# Patient Record
Sex: Male | Born: 1990 | Hispanic: No | Marital: Single | Smoking: Never smoker
Health system: Southern US, Community
[De-identification: ages and names within clinical notes are randomized; demographics above are authoritative.]

## PROBLEM LIST (undated history)

## (undated) DIAGNOSIS — J302 Other seasonal allergic rhinitis: Secondary | ICD-10-CM

## (undated) HISTORY — PX: APPENDECTOMY: SHX54

---
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
## 2011-02-01 ENCOUNTER — Emergency Department (HOSPITAL_COMMUNITY)
Admission: EM | Admit: 2011-02-01 | Discharge: 2011-02-02 | Disposition: A | Discharge status: Home / Self Care | Payer: Self-pay | Attending: Emergency Medicine | Admitting: Emergency Medicine

## 2011-02-01 DIAGNOSIS — R1013 Epigastric pain: Secondary | ICD-10-CM | POA: Insufficient documentation

## 2011-02-01 DIAGNOSIS — R079 Chest pain, unspecified: Secondary | ICD-10-CM | POA: Insufficient documentation

## 2011-02-01 DIAGNOSIS — F411 Generalized anxiety disorder: Secondary | ICD-10-CM | POA: Insufficient documentation

## 2011-02-03 LAB — GC/CHLAMYDIA PROBE AMP, GENITAL
Chlamydia, DNA Probe: NEGATIVE
GC Probe Amp, Genital: NEGATIVE

## 2011-03-02 LAB — RAPID URINE DRUG SCREEN, HOSP PERFORMED
Cocaine: NOT DETECTED
Opiates: NOT DETECTED

## 2011-03-02 LAB — BASIC METABOLIC PANEL
GFR calc Af Amer: >60 mL/min (ref >60)
GFR calc non Af Amer: >60 mL/min (ref >60)
Glucose, Bld: 124 mg/dL — ABNORMAL HIGH (ref 70–99)
Potassium: 3.8 mEq/L (ref 3.5–5.1)
Sodium: 138 mEq/L (ref 135–145)

## 2011-03-02 LAB — DIFFERENTIAL
Eosinophils Relative: 3 % (ref 0–5)
Lymphocytes Relative: 38 % (ref 12–46)
Lymphs Abs: 3.6 10*3/uL (ref 0.7–4.0)
Monocytes Absolute: 0.6 10*3/uL (ref 0.1–1.0)
Monocytes Relative: 6 % (ref 3–12)
Neutro Abs: 4.9 10*3/uL (ref 1.7–7.7)

## 2011-03-02 LAB — CBC
HCT: 47.2 % (ref 39.0–52.0)
Hemoglobin: 16.1 g/dL (ref 13.0–17.0)
RBC: 5.59 MIL/uL (ref 4.22–5.81)
WBC: 9.5 10*3/uL (ref 4.0–10.5)

## 2011-03-02 LAB — POCT CARDIAC MARKERS
CKMB, poc: <1 ng/mL — ABNORMAL LOW (ref 1.0–8.0)
Myoglobin, poc: 43.9 ng/mL (ref 12–200)
Troponin i, poc: <0.05 ng/mL (ref 0.00–0.09)

## 2011-03-06 LAB — RAPID URINE DRUG SCREEN, HOSP PERFORMED
Amphetamines: NOT DETECTED
Barbiturates: NOT DETECTED
Benzodiazepines: NOT DETECTED
Cocaine: NOT DETECTED
Opiates: NOT DETECTED
Tetrahydrocannabinol: NOT DETECTED

## 2011-03-06 LAB — POCT CARDIAC MARKERS: Troponin i, poc: <0.05 ng/mL (ref 0.00–0.09)

## 2011-03-06 LAB — D-DIMER, QUANTITATIVE: D-Dimer, Quant: <0.22 ug/mL-FEU (ref 0.00–0.48)

## 2011-08-05 ENCOUNTER — Emergency Department (HOSPITAL_COMMUNITY)
Admission: EM | Admit: 2011-08-05 | Discharge: 2011-08-06 | Disposition: A | Discharge status: Home / Self Care | Payer: Self-pay | Attending: Emergency Medicine | Admitting: Emergency Medicine

## 2011-08-05 DIAGNOSIS — R079 Chest pain, unspecified: Secondary | ICD-10-CM | POA: Insufficient documentation

## 2011-08-05 DIAGNOSIS — M940 Chondrocostal junction syndrome [Tietze]: Secondary | ICD-10-CM | POA: Insufficient documentation

## 2011-08-06 ENCOUNTER — Emergency Department (HOSPITAL_COMMUNITY): Payer: Self-pay

## 2014-03-14 ENCOUNTER — Encounter (HOSPITAL_COMMUNITY): Payer: Self-pay | Admitting: Pharmacy Technician

## 2014-03-15 NOTE — H&P (Signed)
Rodney Thomas is an 23 y.o. male.   Chief Complaint: broken left arm HPI: In [place] doing a job training function with [employer]  He tried climbing over a fence to go after a basketball and as he went over the top of the fence he states he did not come down to the ground right and fell, injuring his left forearm.  [place] was [place] in [place] in [place] and was diagnosed with a radial shaft fracture.  Ulna was intact.  RADIOGRAPHS:  X-rays were reviewed, two views forearm which demonstrates the proximal 3rd radial shaft fracture with complete displacement without significant ambulation. No past medical history on file.  No past surgical history on file.  No family history on file. Social History:  has no tobacco, alcohol, and drug history on file.  Allergies: No Known Allergies  No prescriptions prior to admission    No results found for this or any previous visit (from the past 48 hour(s)). No results found.  Review of Systems  Musculoskeletal:       Left arm pain  All other systems reviewed and are negative.   There were no vitals taken for this visit. Physical Exam  Constitutional: He is oriented to person, place, and time. He appears well-developed and well-nourished.  HENT:  Head: Normocephalic and atraumatic.  Eyes: Pupils are equal, round, and reactive to light.  Neck: Normal range of motion.  Cardiovascular: Normal rate.   Respiratory: Effort normal.  GI: Soft.  Musculoskeletal:    Shoulder range of motion is full.  He is in a sugar tong splint, minimal swelling of the hand, he has full extension of his fingers.  Radial post interosseous nerve is intact.  Neurological: He is alert and oriented to person, place, and time.     Assessment/Plan Left radial shaft fracture  PLAN:  ORIF of left displaced radial shaft fracture.  Wende NeighborsSheila M Oland [place] 03/15/2014, 4:32 PM

## 2014-03-16 ENCOUNTER — Other Ambulatory Visit (HOSPITAL_COMMUNITY): Payer: Self-pay | Admitting: Orthopaedic Surgery

## 2014-03-16 ENCOUNTER — Encounter (HOSPITAL_COMMUNITY): Payer: Self-pay | Admitting: *Deleted

## 2014-03-17 ENCOUNTER — Encounter (HOSPITAL_COMMUNITY): Payer: Self-pay | Admitting: *Deleted

## 2014-03-17 ENCOUNTER — Encounter (HOSPITAL_COMMUNITY): Payer: Worker's Compensation | Admitting: Anesthesiology

## 2014-03-17 ENCOUNTER — Observation Stay (HOSPITAL_COMMUNITY)
Admission: RE | Admit: 2014-03-17 | Discharge: 2014-03-18 | Disposition: A | Discharge status: Home / Self Care | Payer: Worker's Compensation | Source: Ambulatory Visit | Attending: Orthopaedic Surgery | Admitting: Orthopaedic Surgery

## 2014-03-17 ENCOUNTER — Encounter (HOSPITAL_COMMUNITY)
Admission: RE | Disposition: A | Discharge status: Home / Self Care | Payer: Self-pay | Source: Ambulatory Visit | Attending: Orthopaedic Surgery

## 2014-03-17 ENCOUNTER — Ambulatory Visit (HOSPITAL_COMMUNITY): Payer: Worker's Compensation | Admitting: Anesthesiology

## 2014-03-17 DIAGNOSIS — S52302A Unspecified fracture of shaft of left radius, initial encounter for closed fracture: Secondary | ICD-10-CM | POA: Diagnosis present

## 2014-03-17 DIAGNOSIS — S52309A Unspecified fracture of shaft of unspecified radius, initial encounter for closed fracture: Principal | ICD-10-CM | POA: Insufficient documentation

## 2014-03-17 DIAGNOSIS — W1789XA Other fall from one level to another, initial encounter: Secondary | ICD-10-CM | POA: Insufficient documentation

## 2014-03-17 HISTORY — DX: Other seasonal allergic rhinitis: J30.2

## 2014-03-17 HISTORY — PX: ORIF RADIAL FRACTURE: SHX5113

## 2014-03-17 LAB — CBC
HCT: 45.3 % (ref 39.0–52.0)
Hemoglobin: 15.5 g/dL (ref 13.0–17.0)
MCH: 28.3 pg (ref 26.0–34.0)
MCHC: 34.2 g/dL (ref 30.0–36.0)
MCV: 82.8 fL (ref 78.0–100.0)
PLATELETS: 230 10*3/uL (ref 150–400)
RBC: 5.47 MIL/uL (ref 4.22–5.81)
RDW: 14.7 % (ref 11.5–15.5)
WBC: 10.7 10*3/uL — ABNORMAL HIGH (ref 4.0–10.5)

## 2014-03-17 LAB — COMPREHENSIVE METABOLIC PANEL
ALK PHOS: 118 U/L — AB (ref 39–117)
ALT: 11 U/L (ref 0–53)
AST: 18 U/L (ref 0–37)
Albumin: 4.4 g/dL (ref 3.5–5.2)
BUN: 20 mg/dL (ref 6–23)
CHLORIDE: 101 meq/L (ref 96–112)
CO2: 27 meq/L (ref 19–32)
Calcium: 9.8 mg/dL (ref 8.4–10.5)
Creatinine, Ser: 1.49 mg/dL — ABNORMAL HIGH (ref 0.50–1.35)
GFR calc Af Amer: 76 mL/min — ABNORMAL LOW (ref >90)
GFR, EST NON AFRICAN AMERICAN: 65 mL/min — AB (ref >90)
GLUCOSE: 90 mg/dL (ref 70–99)
POTASSIUM: 3.9 meq/L (ref 3.7–5.3)
SODIUM: 143 meq/L (ref 137–147)
TOTAL PROTEIN: 7.9 g/dL (ref 6.0–8.3)
Total Bilirubin: 0.2 mg/dL — ABNORMAL LOW (ref 0.3–1.2)

## 2014-03-17 SURGERY — OPEN REDUCTION INTERNAL FIXATION (ORIF) RADIAL FRACTURE
Anesthesia: General | Site: Wrist | Laterality: Left

## 2014-03-17 MED ORDER — LIDOCAINE HCL (CARDIAC) 20 MG/ML IV SOLN
INTRAVENOUS | Status: AC
  Filled 2014-03-17: qty 5

## 2014-03-17 MED ORDER — MORPHINE SULFATE 2 MG/ML IJ SOLN: 1.0000 mg | INTRAMUSCULAR | Status: DC | PRN

## 2014-03-17 MED ORDER — CEFAZOLIN SODIUM-DEXTROSE 2-3 GM-% IV SOLR
INTRAVENOUS | Status: AC
  Filled 2014-03-17: qty 50

## 2014-03-17 MED ORDER — ONDANSETRON HCL 4 MG PO TABS: 4.0000 mg | ORAL_TABLET | Freq: Four times a day (QID) | ORAL | Status: DC | PRN

## 2014-03-17 MED ORDER — ONDANSETRON HCL 4 MG/2ML IJ SOLN
INTRAMUSCULAR | Status: AC
  Filled 2014-03-17: qty 2

## 2014-03-17 MED ORDER — BUPIVACAINE HCL (PF) 0.25 % IJ SOLN
INTRAMUSCULAR | Status: AC
  Filled 2014-03-17: qty 30

## 2014-03-17 MED ORDER — ARTIFICIAL TEARS OP OINT
TOPICAL_OINTMENT | OPHTHALMIC | Status: AC
  Filled 2014-03-17: qty 3.5

## 2014-03-17 MED ORDER — DOCUSATE SODIUM 100 MG PO CAPS
100.0000 mg | ORAL_CAPSULE | Freq: Two times a day (BID) | ORAL | Status: DC
  Administered 2014-03-17 – 2014-03-18 (×2): 100 mg via ORAL
  Filled 2014-03-17 (×2): qty 1

## 2014-03-17 MED ORDER — HYDROMORPHONE HCL PF 1 MG/ML IJ SOLN
INTRAMUSCULAR | Status: AC
  Filled 2014-03-17: qty 1

## 2014-03-17 MED ORDER — METHOCARBAMOL 100 MG/ML IJ SOLN
500.0000 mg | Freq: Four times a day (QID) | INTRAVENOUS | Status: DC | PRN
  Filled 2014-03-17: qty 5

## 2014-03-17 MED ORDER — ONDANSETRON HCL 4 MG/2ML IJ SOLN: 4.0000 mg | Freq: Four times a day (QID) | INTRAMUSCULAR | Status: DC | PRN

## 2014-03-17 MED ORDER — OXYCODONE-ACETAMINOPHEN 5-325 MG PO TABS: 1.0000 | ORAL_TABLET | ORAL | Status: DC | PRN

## 2014-03-17 MED ORDER — PROPOFOL 10 MG/ML IV BOLUS
INTRAVENOUS | Status: DC | PRN
  Administered 2014-03-17: 10 mg via INTRAVENOUS
  Administered 2014-03-17: 200 mg via INTRAVENOUS

## 2014-03-17 MED ORDER — FENTANYL CITRATE 0.05 MG/ML IJ SOLN
INTRAMUSCULAR | Status: DC | PRN
  Administered 2014-03-17 (×3): 50 ug via INTRAVENOUS

## 2014-03-17 MED ORDER — SENNOSIDES-DOCUSATE SODIUM 8.6-50 MG PO TABS
1.0000 | ORAL_TABLET | Freq: Every evening | ORAL | Status: DC | PRN
Start: 2014-03-17 — End: 2014-03-18

## 2014-03-17 MED ORDER — PROMETHAZINE HCL 25 MG/ML IJ SOLN: 6.2500 mg | INTRAMUSCULAR | Status: DC | PRN

## 2014-03-17 MED ORDER — FLEET ENEMA 7-19 GM/118ML RE ENEM: 1.0000 | ENEMA | Freq: Once | RECTAL | Status: AC | PRN

## 2014-03-17 MED ORDER — BISACODYL 10 MG RE SUPP: 10.0000 mg | Freq: Every day | RECTAL | Status: DC | PRN

## 2014-03-17 MED ORDER — HYDROMORPHONE HCL PF 1 MG/ML IJ SOLN
0.2500 mg | INTRAMUSCULAR | Status: DC | PRN
  Administered 2014-03-17 (×2): 0.25 mg via INTRAVENOUS

## 2014-03-17 MED ORDER — KETOROLAC TROMETHAMINE 30 MG/ML IJ SOLN: 15.0000 mg | Freq: Once | INTRAMUSCULAR | Status: DC | PRN

## 2014-03-17 MED ORDER — ASPIRIN 325 MG PO TABS
325.0000 mg | ORAL_TABLET | Freq: Every day | ORAL | Status: DC
  Administered 2014-03-17 – 2014-03-18 (×2): 325 mg via ORAL
  Filled 2014-03-17 (×2): qty 1

## 2014-03-17 MED ORDER — METOCLOPRAMIDE HCL 5 MG/ML IJ SOLN: 5.0000 mg | Freq: Three times a day (TID) | INTRAMUSCULAR | Status: DC | PRN

## 2014-03-17 MED ORDER — ONDANSETRON HCL 4 MG/2ML IJ SOLN
INTRAMUSCULAR | Status: DC | PRN
  Administered 2014-03-17: 4 mg via INTRAVENOUS

## 2014-03-17 MED ORDER — MEPERIDINE HCL 25 MG/ML IJ SOLN: 6.2500 mg | INTRAMUSCULAR | Status: DC | PRN

## 2014-03-17 MED ORDER — PROPOFOL 10 MG/ML IV BOLUS
INTRAVENOUS | Status: AC
  Filled 2014-03-17: qty 20

## 2014-03-17 MED ORDER — MIDAZOLAM HCL 2 MG/2ML IJ SOLN: 0.5000 mg | Freq: Once | INTRAMUSCULAR | Status: DC | PRN

## 2014-03-17 MED ORDER — OXYCODONE-ACETAMINOPHEN 5-325 MG PO TABS
1.0000 | ORAL_TABLET | ORAL | Status: DC | PRN
  Administered 2014-03-18: 2 via ORAL
  Filled 2014-03-17: qty 2

## 2014-03-17 MED ORDER — MIDAZOLAM HCL 2 MG/2ML IJ SOLN
INTRAMUSCULAR | Status: AC
  Filled 2014-03-17: qty 2

## 2014-03-17 MED ORDER — MIDAZOLAM HCL 5 MG/5ML IJ SOLN
INTRAMUSCULAR | Status: DC | PRN
  Administered 2014-03-17 (×2): 1 mg via INTRAVENOUS

## 2014-03-17 MED ORDER — KETOROLAC TROMETHAMINE 30 MG/ML IJ SOLN
30.0000 mg | Freq: Four times a day (QID) | INTRAMUSCULAR | Status: AC
  Administered 2014-03-17 – 2014-03-18 (×4): 30 mg via INTRAVENOUS
  Filled 2014-03-17 (×4): qty 1

## 2014-03-17 MED ORDER — DEXTROSE 5 % IV SOLN
INTRAVENOUS | Status: DC | PRN
  Administered 2014-03-17: 11:00:00 via INTRAVENOUS

## 2014-03-17 MED ORDER — KCL IN DEXTROSE-NACL 20-5-0.45 MEQ/L-%-% IV SOLN
INTRAVENOUS | Status: DC
  Administered 2014-03-17 – 2014-03-18 (×2): via INTRAVENOUS
  Filled 2014-03-17 (×3): qty 1000

## 2014-03-17 MED ORDER — METOCLOPRAMIDE HCL 5 MG PO TABS
5.0000 mg | ORAL_TABLET | Freq: Three times a day (TID) | ORAL | Status: DC | PRN
  Filled 2014-03-17: qty 2

## 2014-03-17 MED ORDER — LACTATED RINGERS IV SOLN
INTRAVENOUS | Status: DC | PRN
  Administered 2014-03-17 (×2): via INTRAVENOUS

## 2014-03-17 MED ORDER — FENTANYL CITRATE 0.05 MG/ML IJ SOLN
INTRAMUSCULAR | Status: AC
  Filled 2014-03-17: qty 5

## 2014-03-17 MED ORDER — BUPIVACAINE HCL 0.25 % IJ SOLN
INTRAMUSCULAR | Status: DC | PRN
  Administered 2014-03-17: 10 mL

## 2014-03-17 MED ORDER — HYDROCODONE-ACETAMINOPHEN 5-325 MG PO TABS: 1.0000 | ORAL_TABLET | ORAL | Status: DC | PRN

## 2014-03-17 MED ORDER — LIDOCAINE HCL (CARDIAC) 20 MG/ML IV SOLN
INTRAVENOUS | Status: DC | PRN
Start: 2014-03-17 — End: 2014-03-17
  Administered 2014-03-17: 50 mg via INTRAVENOUS

## 2014-03-17 MED ORDER — CEFAZOLIN SODIUM-DEXTROSE 2-3 GM-% IV SOLR
2.0000 g | INTRAVENOUS | Status: AC
  Administered 2014-03-17: 2 g via INTRAVENOUS

## 2014-03-17 MED ORDER — ARTIFICIAL TEARS OP OINT
TOPICAL_OINTMENT | OPHTHALMIC | Status: DC | PRN
  Administered 2014-03-17: 1 via OPHTHALMIC

## 2014-03-17 MED ORDER — METHOCARBAMOL 500 MG PO TABS: 500.0000 mg | ORAL_TABLET | Freq: Four times a day (QID) | ORAL | Status: DC | PRN

## 2014-03-17 MED ORDER — LACTATED RINGERS IV SOLN
INTRAVENOUS | Status: DC
  Administered 2014-03-17: 07:00:00 via INTRAVENOUS

## 2014-03-17 SURGICAL SUPPLY — 57 items
BANDAGE ELASTIC 3 VELCRO ST LF (GAUZE/BANDAGES/DRESSINGS) ×3 IMPLANT
BANDAGE ELASTIC 4 VELCRO ST LF (GAUZE/BANDAGES/DRESSINGS) ×3 IMPLANT
BANDAGE GAUZE ELAST BULKY 4 IN (GAUZE/BANDAGES/DRESSINGS) ×6 IMPLANT
BIT DRILL 2.5X110 QC LCP DISP (BIT) ×3 IMPLANT
BNDG ESMARK 4X9 LF (GAUZE/BANDAGES/DRESSINGS) ×3 IMPLANT
CORDS BIPOLAR (ELECTRODE) IMPLANT
COVER SURGICAL LIGHT HANDLE (MISCELLANEOUS) ×3 IMPLANT
CUFF TOURNIQUET SINGLE 18IN (TOURNIQUET CUFF) ×3 IMPLANT
CUFF TOURNIQUET SINGLE 24IN (TOURNIQUET CUFF) IMPLANT
DRAPE OEC MINIVIEW 54X84 (DRAPES) IMPLANT
DRAPE SURG 17X23 STRL (DRAPES) ×3 IMPLANT
DURAPREP 26ML APPLICATOR (WOUND CARE) ×3 IMPLANT
ELECT REM PT RETURN 9FT ADLT (ELECTROSURGICAL)
ELECTRODE REM PT RTRN 9FT ADLT (ELECTROSURGICAL) IMPLANT
GAUZE XEROFORM 1X8 LF (GAUZE/BANDAGES/DRESSINGS) ×3 IMPLANT
GAUZE XEROFORM 5X9 LF (GAUZE/BANDAGES/DRESSINGS) ×3 IMPLANT
GLOVE BIOGEL PI IND STRL 8 (GLOVE) ×1 IMPLANT
GLOVE BIOGEL PI INDICATOR 8 (GLOVE) ×2
GLOVE ORTHO TXT STRL SZ7.5 (GLOVE) ×3 IMPLANT
GOWN STRL REUS W/ TWL LRG LVL3 (GOWN DISPOSABLE) ×1 IMPLANT
GOWN STRL REUS W/ TWL XL LVL3 (GOWN DISPOSABLE) ×1 IMPLANT
GOWN STRL REUS W/TWL LRG LVL3 (GOWN DISPOSABLE) ×2
GOWN STRL REUS W/TWL XL LVL3 (GOWN DISPOSABLE) ×2
KIT BASIN OR (CUSTOM PROCEDURE TRAY) ×3 IMPLANT
KIT ROOM TURNOVER OR (KITS) ×3 IMPLANT
MANIFOLD NEPTUNE II (INSTRUMENTS) ×3 IMPLANT
NEEDLE HYPO 25GX1X1/2 BEV (NEEDLE) IMPLANT
NEEDLE HYPO 25X1 1.5 SAFETY (NEEDLE) IMPLANT
NS IRRIG 1000ML POUR BTL (IV SOLUTION) ×3 IMPLANT
PACK ORTHO EXTREMITY (CUSTOM PROCEDURE TRAY) ×3 IMPLANT
PAD ARMBOARD 7.5X6 YLW CONV (MISCELLANEOUS) ×6 IMPLANT
PAD CAST 3X4 CTTN HI CHSV (CAST SUPPLIES) ×1 IMPLANT
PAD CAST 4YDX4 CTTN HI CHSV (CAST SUPPLIES) ×1 IMPLANT
PADDING CAST COTTON 3X4 STRL (CAST SUPPLIES) ×2
PADDING CAST COTTON 4X4 STRL (CAST SUPPLIES) ×2
PENCIL BUTTON HOLSTER BLD 10FT (ELECTRODE) IMPLANT
PROS LCP PLATE 6H 85MM (Plate) ×3 IMPLANT
PROSTHESIS LCP PLATE 6H 85MM (Plate) ×1 IMPLANT
SCREW CORTEX 3.5 16MM (Screw) ×6 IMPLANT
SCREW CORTEX 3.5 18MM (Screw) ×6 IMPLANT
SCREW LOCK CORT ST 3.5X16 (Screw) ×3 IMPLANT
SCREW LOCK CORT ST 3.5X18 (Screw) ×3 IMPLANT
SPONGE GAUZE 4X4 12PLY (GAUZE/BANDAGES/DRESSINGS) ×3 IMPLANT
SPONGE GAUZE 4X4 12PLY STER LF (GAUZE/BANDAGES/DRESSINGS) ×3 IMPLANT
SPONGE LAP 4X18 X RAY DECT (DISPOSABLE) ×6 IMPLANT
SPONGE SCRUB IODOPHOR (GAUZE/BANDAGES/DRESSINGS) ×3 IMPLANT
SUCTION FRAZIER TIP 10 FR DISP (SUCTIONS) IMPLANT
SUT PROLENE 3 0 PS 2 (SUTURE) IMPLANT
SUT VIC AB 3-0 FS2 27 (SUTURE) IMPLANT
SUT VICRYL 4-0 PS2 18IN ABS (SUTURE) IMPLANT
SYR CONTROL 10ML LL (SYRINGE) IMPLANT
TOWEL OR 17X24 6PK STRL BLUE (TOWEL DISPOSABLE) ×3 IMPLANT
TOWEL OR 17X26 10 PK STRL BLUE (TOWEL DISPOSABLE) ×3 IMPLANT
TUBE CONNECTING 12'X1/4 (SUCTIONS)
TUBE CONNECTING 12X1/4 (SUCTIONS) IMPLANT
UNDERPAD 30X30 INCONTINENT (UNDERPADS AND DIAPERS) ×3 IMPLANT
WATER STERILE IRR 1000ML POUR (IV SOLUTION) ×3 IMPLANT

## 2014-03-17 NOTE — Interval H&P Note (Signed)
History and Physical Interval Note:  03/17/2014 9:46 AM  Rodney Thomas  has presented today for surgery, with the diagnosis of Left Radial Shaft Fracture  The various methods of treatment have been discussed with the patient and family. After consideration of risks, benefits and other options for treatment, the patient has consented to  Procedure(s) with comments: OPEN REDUCTION INTERNAL FIXATION (ORIF) RADIAL FRACTURE (Left) - Open Reduction Internal Fixation Left Radial Shaft Fracture as a surgical intervention .  The patient's history has been reviewed, patient examined, no change in status, stable for surgery.  I have reviewed the patient's chart and labs.  Questions were answered to the patient's satisfaction.     Eldred MangesMark C Brice Potteiger

## 2014-03-17 NOTE — Brief Op Note (Cosign Needed)
03/17/2014  12:36 PM  PATIENT:  Rodney AbleJoshua Thomas  23 y.o. male  PRE-OPERATIVE DIAGNOSIS:  Left Radial Shaft Fracture  POST-OPERATIVE DIAGNOSIS:  Left Radial Shaft Fracture  PROCEDURE:  Procedure(s) with comments: OPEN REDUCTION INTERNAL FIXATION (ORIF) RADIAL FRACTURE (Left) - Open Reduction Internal Fixation Left Radial Shaft Fracture  SURGEON:  Surgeon(s) and Role:    * Eldred MangesMark C Yates, MD - Primary  PHYSICIAN ASSISTANT: Maud DeedSheila Jazmon Kos [facility]  ASSISTANTS: none   ANESTHESIA:   general  EBL:  Total I/O In: 1050 [I.V.:1050] Out: -   BLOOD ADMINISTERED:none  DRAINS: (1) Hemovact drain(s) in the left forearm with  Suction Open   LOCAL MEDICATIONS USED:  MARCAINE     SPECIMEN:  No Specimen  DISPOSITION OF SPECIMEN:  N/A  COUNTS:  YES  TOURNIQUET:   Total Tourniquet Time Documented: Upper Arm (Left) - 57 minutes Total: Upper Arm (Left) - 57 minutes   DICTATION: .Note written in EPIC  PLAN OF CARE: Admit for overnight observation  PATIENT DISPOSITION:  PACU - hemodynamically stable.   Delay start of Pharmacological VTE agent (>24hrs) due to surgical blood loss or risk of bleeding: yes

## 2014-03-17 NOTE — Anesthesia Preprocedure Evaluation (Addendum)
Anesthesia Evaluation  Patient identified by MRN, date of birth, ID band Patient awake    Reviewed: Allergy & Precautions, H&P , Patient's Chart, lab work & pertinent test results, reviewed documented beta blocker date and time   History of Anesthesia Complications Negative for: history of anesthetic complications  Airway Mallampati: II TM Distance: >3 FB Neck ROM: full    Dental  (+) Teeth Intact, Dental Advisory Given   Pulmonary  breath sounds clear to auscultation        Cardiovascular Exercise Tolerance: Good Rhythm:regular Rate:Normal     Neuro/Psych    GI/Hepatic   Endo/Other    Renal/GU      Musculoskeletal   Abdominal   Peds  Hematology   Anesthesia Other Findings   Reproductive/Obstetrics                          Anesthesia Physical Anesthesia Plan  ASA: I  Anesthesia Plan: General LMA   Post-op Pain Management:    Induction:   Airway Management Planned:   Additional Equipment:   Intra-op Plan:   Post-operative Plan:   Informed Consent: I have reviewed the patients History and Physical, chart, labs and discussed the procedure including the risks, benefits and alternatives for the proposed anesthesia with the patient or authorized representative who has indicated his/her understanding and acceptance.   Dental Advisory Given  Plan Discussed with: CRNA and Surgeon  Anesthesia Plan Comments:        Anesthesia Quick Evaluation

## 2014-03-17 NOTE — Anesthesia Procedure Notes (Signed)
Procedure Name: LMA Insertion Date/Time: 03/17/2014 10:40 AM Performed by: Marni GriffonJAMES, Gilles Trimpe B Pre-anesthesia Checklist: Patient identified, Emergency Drugs available, Suction available and Patient being monitored Patient Re-evaluated:Patient Re-evaluated prior to inductionOxygen Delivery Method: Circle system utilized Preoxygenation: Pre-oxygenation with 100% oxygen Intubation Type: IV induction Ventilation: Mask ventilation without difficulty LMA: LMA inserted LMA Size: 5.0 Number of attempts: 1 Placement Confirmation: breath sounds checked- equal and bilateral and positive ETCO2 Tube secured with: Tape (taped across cheeks; gauze roll b/t teeth) Dental Injury: Teeth and Oropharynx as per pre-operative assessment

## 2014-03-17 NOTE — Transfer of Care (Signed)
Immediate Anesthesia Transfer of Care Note  Patient: Rodney Thomas  Procedure(s) Performed: Procedure(s) with comments: OPEN REDUCTION INTERNAL FIXATION (ORIF) RADIAL FRACTURE (Left) - Open Reduction Internal Fixation Left Radial Shaft Fracture  Patient Location: PACU  Anesthesia Type:General  Level of Consciousness: awake and sedated  Airway & Oxygen Therapy: Patient Spontanous Breathing and Patient connected to face mask oxygen  Post-op Assessment: Report given to PACU RN, Post -op Vital signs reviewed and stable and Patient moving all extremities  Post vital signs: Reviewed and stable  Complications: No apparent anesthesia complications

## 2014-03-17 NOTE — Op Note (Signed)
Past test test test test test  Preop diagnosis: left radial shaft fracture with displacement  Postop diagnosis: Same  Procedure: Open reduction internal fixation compression plating left radial shaft fracture  Surgeon Annell GreeningMark Joscelin Fray M.D.  Asst.: Maud DeedSheila Vernon PA-C  medically necessary and present for the entire procedure  Anesthesia Gen.  Complications none  Drains: One Hemovac  Operative procedure  After the induction general anesthesia standard prepping and draping proximal arm tourniquet DuraPrep was used Ancef was given prophylactically timeout procedure was completed. Usual split sheets drapes extremity sheet and drape was applied after stockinette. Sterile skin Loraine LericheMark was used for a volar approach to the forearm and the distal aspect was slightly curved to come between the tattooed name of the patient's father that had been tattooed increaser riding on a farm. Distal allowed the standard approach for a Henry approach to the volar surface of the radius. Radial artery was divided distally. Right radialis was elevated radial sensory nerve was identified carefully protected. Periosteum was stripped off of the distal fragment first falling and up and the patient surprising amount of callus formation with the completely displaced fracture which was also angulated. Patient's ulna was intact and had been about 1 cm of shortening. There is extensive callus the callus had to be removed with curet. Care was taken to proximal fragment to stay directly down the cortical bone surface with the proximity of the posterior interosseous nerve. Once cortex was completely visualized to lobster-claw clamps are placed in the proximal distal fragment some continued removal of callus formation about fragment was it'll be reduced near-anatomic position. There was a butterfly fragment that involved one third of the cortex that was 1.3 cm in length. It was carefully fit back into place with a 6 hole 3.5 Synthes stainless  steel compression plate was applied and then the butterfly fragment was tapped in with a bone impactor and anatomic position. It was clamped on. Initially with proximal screw fixation the reduction was lost. Reduction maneuver was repeated and then continue placement of to proximal neutral screws 16 and 18 mm in length followed by a distally placed screw drilled with the Gold guide for compression with air applied for the fracture compression fracture site. Bone impactor was placed with a butterfly fragment to make sure it stayed exactly minutes the reduced position. Additionally of distal screws were filled there were neutral all 6 screws were placed and tightened down securely. Spot pictures with the mini C-arm documenting position alignment. Copious irrigation Hemovacs placed in and out technique with care taken for positioning of the radial artery median nerve. Subtendinous tissue reapproximated through Vicryl. Skin closure postop dressing a long-arm splint about 90 neutral position. Marcaine was infiltrated patient tolerated the procedure well and was transferred to the recovery room in stable condition

## 2014-03-17 NOTE — Discharge Instructions (Signed)
Keep splint dry and clean at all times.  Use sling at all times.  Move fingers frequently.  What to eat:  For your first meals, you should eat lightly; only small meals initially.  If you do not have nausea, you may eat larger meals.  Avoid spicy, greasy and heavy food.    General Anesthesia, Adult, Care After  Refer to this sheet in the next few weeks. These instructions provide you with information on caring for yourself after your procedure. Your health care provider may also give you more specific instructions. Your treatment has been planned according to current medical practices, but problems sometimes occur. Call your health care provider if you have any problems or questions after your procedure.  WHAT TO EXPECT AFTER THE PROCEDURE  After the procedure, it is typical to experience:  Sleepiness.  Nausea and vomiting. HOME CARE INSTRUCTIONS  For the first 24 hours after general anesthesia:  Have a responsible person with you.  Do not drive a car. If you are alone, do not take public transportation.  Do not drink alcohol.  Do not take medicine that has not been prescribed by your health care provider.  Do not sign important papers or make important decisions.  You may resume a normal diet and activities as directed by your health care provider.  Change bandages (dressings) as directed.  If you have questions or problems that seem related to general anesthesia, call the hospital and ask for the anesthetist or anesthesiologist on call. SEEK MEDICAL CARE IF:  You have nausea and vomiting that continue the day after anesthesia.  You develop a rash. SEEK IMMEDIATE MEDICAL CARE IF:  You have difficulty breathing.  You have chest pain.  You have any allergic problems. Document Released: 02/16/2001 Document Revised: 07/13/2013 Document Reviewed: 05/26/2013  [facility] Patient Information 2014 [place], [place].    Cast or Splint Care Casts and splints support injured limbs and keep bones  from moving while they heal. It is important to care for your cast or splint at home.  HOME CARE INSTRUCTIONS  Keep the cast or splint uncovered during the drying period. It can take 24 to 48 hours to dry if it is made of plaster. A fiberglass cast will dry in less than 1 hour.  Do not rest the cast on anything harder than a pillow for the first 24 hours.  Do not put weight on your injured limb or apply pressure to the cast until your health care provider gives you permission.  Keep the cast or splint dry. Wet casts or splints can lose their shape and may not support the limb as well. A wet cast that has lost its shape can also create harmful pressure on your skin when it dries. Also, wet skin can become infected.  Cover the cast or splint with a plastic bag when bathing or when out in the rain or snow. If the cast is on the trunk of the body, take sponge baths until the cast is removed.  If your cast does become wet, dry it with a towel or a blow dryer on the cool setting only.  Keep your cast or splint clean. Soiled casts may be wiped with a moistened cloth.  Do not place any hard or soft foreign objects under your cast or splint, such as cotton, toilet paper, lotion, or powder.  Do not try to scratch the skin under the cast with any object. The object could get stuck inside the cast. Also, scratching  could lead to an infection. If itching is a problem, use a blow dryer on a cool setting to relieve discomfort.  Do not trim or cut your cast or remove padding from inside of it.  Exercise all joints next to the injury that are not immobilized by the cast or splint. For example, if you have a long leg cast, exercise the hip joint and toes. If you have an arm cast or splint, exercise the shoulder, elbow, thumb, and fingers.  Elevate your injured arm or leg on 1 or 2 pillows for the first 1 to 3 days to decrease swelling and pain.It is best if you can comfortably elevate your cast so it is  higher than your heart. SEEK MEDICAL CARE IF:   Your cast or splint cracks.  Your cast or splint is too tight or too loose.  You have unbearable itching inside the cast.  Your cast becomes wet or develops a soft spot or area.  You have a bad smell coming from inside your cast.  You get an object stuck under your cast.  Your skin around the cast becomes red or raw.  You have new pain or worsening pain after the cast has been applied. SEEK IMMEDIATE MEDICAL CARE IF:   You have fluid leaking through the cast.  You are unable to move your fingers or toes.  You have discolored (blue or white), cool, painful, or very swollen fingers or toes beyond the cast.  You have tingling or numbness around the injured area.  You have severe pain or pressure under the cast.  You have any difficulty with your breathing or have shortness of breath.  You have chest pain. Document Released: 11/07/2000 Document Revised: 08/31/2013 Document Reviewed: 05/19/2013 [facility] Patient Information 2014 [place], [place].

## 2014-03-17 NOTE — Progress Notes (Signed)
Patient ID: Rodney Thomas, male   DOB: 01-09-1991, 23 y.o.   MRN: [id number] POST OP CHECK: Pt moving fingers well with flexion and extension.   Thumb with flexion and extension.  Posterior interosseus nerve function intact. PLAN: remove drain in am on round  Discharge home in am.  OV 1 week.  rx on chart for percocet.

## 2014-03-17 NOTE — Anesthesia Postprocedure Evaluation (Signed)
Anesthesia Post Note  Patient: Rodney Thomas  Procedure(s) Performed: Procedure(s) (LRB): OPEN REDUCTION INTERNAL FIXATION (ORIF) RADIAL FRACTURE (Left)  Anesthesia type: General  Patient location: PACU  Post pain: Pain level controlled and Adequate analgesia  Post assessment: Post-op Vital signs reviewed, Patient's Cardiovascular Status Stable, Respiratory Function Stable, Patent Airway and Pain level controlled  Last Vitals:  Filed Vitals:   03/17/14 1433  BP: 139/75  Pulse: 72  Temp: 36.6 C  Resp: 12    Post vital signs: Reviewed and stable  Level of consciousness: awake, alert  and oriented  Complications: No apparent anesthesia complications

## 2014-03-18 NOTE — Progress Notes (Signed)
UR Completed.  Uliana Brinker Jane Shayanna Thatch [phone] 03/18/2014  

## 2014-03-20 NOTE — Discharge Summary (Signed)
Physician Discharge Summary  Patient ID: Rodney AbleJoshua Seehafer MRN: [id number] DOB/AGE: Mar 27, 1991 23 y.o.  Admit date: 03/17/2014 Discharge date: 03/18/2014  Admission Diagnoses:  Fracture of radial shaft, left, closed  Discharge Diagnoses:  Principal Problem:   Fracture of radial shaft, left, closed   Past Medical History  Diagnosis Date  . Seasonal allergies     Surgeries: Procedure(s): OPEN REDUCTION INTERNAL FIXATION (ORIF) RADIAL FRACTURE on 03/17/2014   Consultants (if any):    Discharged Condition: Improved  Hospital Course: Rodney Thomas is an 23 y.o. male who was admitted 03/17/2014 with a diagnosis of Fracture of radial shaft, left, closed and went to the operating room on 03/17/2014 and underwent the above named procedures.    He was given perioperative antibiotics:  Anti-infectives   Start     Dose/Rate Route Frequency Ordered Stop   03/17/14 0715  ceFAZolin (ANCEF) IVPB 2 g/50 mL premix     2 g 100 mL/hr over 30 Minutes Intravenous On call to O.R. 03/17/14 0705 03/17/14 1040   03/17/14 0712  ceFAZolin (ANCEF) 2-3 GM-% IVPB SOLR    Comments:  Ventura Sellersarter, Stacy   : cabinet override      03/17/14 0712 03/17/14 1929    .  He was given sequential compression devices, early ambulation for DVT prophylaxis.  He benefited maximally from the hospital stay and there were no complications.    Recent vital signs:  Filed Vitals:   03/18/14 0557  BP: 120/80  Pulse: 65  Temp: 98.7 F (37.1 C)  Resp: 18    Recent laboratory studies:  Lab Results  Component Value Date   HGB 15.5 03/17/2014   HGB 16.1 06/01/2009   Lab Results  Component Value Date   WBC 10.7* 03/17/2014   PLT 230 03/17/2014   No results found for this basename: INR   Lab Results  Component Value Date   NA 143 03/17/2014   K 3.9 03/17/2014   CL 101 03/17/2014   CO2 27 03/17/2014   BUN 20 03/17/2014   CREATININE 1.49* 03/17/2014   GLUCOSE 90 03/17/2014    Discharge Medications:     Medication List          oxyCODONE-acetaminophen 5-325 MG per tablet  Commonly known as:  ROXICET  Take 1-2 tablets by mouth every 4 (four) hours as needed.        Diagnostic Studies: No results found.  Disposition: 01-Home or Self Care      Discharge Orders   Future Orders Complete By Expires   Call MD / Call 911  As directed    Constipation Prevention  As directed    Diet - low sodium heart healthy  As directed    Increase activity slowly as tolerated  As directed     Keep splint dry and clean at all times.  Use sling at all times.  Move fingers frequently.  Follow-up Information   Follow up with Eldred MangesYATES,MARK C, MD. Schedule an appointment as soon as possible for a visit in 1 week.   Specialty:  Orthopedic Surgery   Contact information:   [address] [place] [address] [phone]        Signed: Wende NeighborsSheila M Jaeley Wiker 03/20/2014, 2:57 PM

## 2014-03-21 ENCOUNTER — Encounter (HOSPITAL_COMMUNITY): Payer: Self-pay | Admitting: Orthopaedic Surgery

## 2015-05-26 ENCOUNTER — Emergency Department (HOSPITAL_COMMUNITY): Payer: Self-pay

## 2015-05-26 ENCOUNTER — Emergency Department (HOSPITAL_COMMUNITY)
Admission: EM | Admit: 2015-05-26 | Discharge: 2015-05-26 | Disposition: A | Discharge status: Home / Self Care | Payer: Self-pay | Attending: Emergency Medicine | Admitting: Emergency Medicine

## 2015-05-26 DIAGNOSIS — R0602 Shortness of breath: Secondary | ICD-10-CM | POA: Insufficient documentation

## 2015-05-26 DIAGNOSIS — R079 Chest pain, unspecified: Secondary | ICD-10-CM | POA: Insufficient documentation

## 2015-05-26 LAB — CBC
HCT: 40.9 % (ref 39.0–52.0)
HEMOGLOBIN: 13.9 g/dL (ref 13.0–17.0)
MCH: 27.8 pg (ref 26.0–34.0)
MCHC: 34 g/dL (ref 30.0–36.0)
MCV: 81.8 fL (ref 78.0–100.0)
Platelets: 177 10*3/uL (ref 150–400)
RBC: 5 MIL/uL (ref 4.22–5.81)
RDW: 14.6 % (ref 11.5–15.5)
WBC: 5.9 10*3/uL (ref 4.0–10.5)

## 2015-05-26 LAB — BASIC METABOLIC PANEL
Anion gap: 7 (ref 5–15)
BUN: 8 mg/dL (ref 6–20)
CO2: 27 mmol/L (ref 22–32)
Calcium: 8.9 mg/dL (ref 8.9–10.3)
Chloride: 105 mmol/L (ref 101–111)
Creatinine, Ser: 1.02 mg/dL (ref 0.61–1.24)
GFR calc Af Amer: >60 mL/min (ref >60)
GFR calc non Af Amer: >60 mL/min (ref >60)
Glucose, Bld: 99 mg/dL (ref 65–99)
Potassium: 3.9 mmol/L (ref 3.5–5.1)
Sodium: 139 mmol/L (ref 135–145)

## 2015-05-26 LAB — I-STAT TROPONIN, ED: TROPONIN I, POC: 0 ng/mL (ref 0.00–0.08)

## 2015-05-26 MED ORDER — IBUPROFEN 600 MG PO TABS: 600.0000 mg | ORAL_TABLET | Freq: Four times a day (QID) | ORAL | Status: DC | PRN

## 2015-05-26 MED ORDER — KETOROLAC TROMETHAMINE 60 MG/2ML IM SOLN
30.0000 mg | Freq: Once | INTRAMUSCULAR | Status: AC
  Administered 2015-05-26: 30 mg via INTRAMUSCULAR
  Filled 2015-05-26: qty 2

## 2015-05-26 NOTE — Discharge Instructions (Signed)
Ibuprofen or tylenol for pain. Follow-up with the wellness Center. Return if pain worsening or any new concerning symptoms.   Chest Pain (Nonspecific) It is often hard to give a specific diagnosis for the cause of chest pain. There is always a chance that your pain could be related to something serious, such as a heart attack or a blood clot in the lungs. You need to follow up with your health care provider for further evaluation. CAUSES   Heartburn.  Pneumonia or bronchitis.  Anxiety or stress.  Inflammation around your heart (pericarditis) or lung (pleuritis or pleurisy).  A blood clot in the lung.  A collapsed lung (pneumothorax). It can develop suddenly on its own (spontaneous pneumothorax) or from trauma to the chest.  Shingles infection (herpes zoster virus). The chest wall is composed of bones, muscles, and cartilage. Any of these can be the source of the pain.  The bones can be bruised by injury.  The muscles or cartilage can be strained by coughing or overwork.  The cartilage can be affected by inflammation and become sore (costochondritis). DIAGNOSIS  Lab tests or other studies may be needed to find the cause of your pain. Your health care provider may have you take a test called an ambulatory electrocardiogram (ECG). An ECG records your heartbeat patterns over a 24-hour period. You may also have other tests, such as:  Transthoracic echocardiogram (TTE). During echocardiography, sound waves are used to evaluate how blood flows through your heart.  Transesophageal echocardiogram (TEE).  Cardiac monitoring. This allows your health care provider to monitor your heart rate and rhythm in real time.  Holter monitor. This is a portable device that records your heartbeat and can help diagnose heart arrhythmias. It allows your health care provider to track your heart activity for several days, if needed.  Stress tests by exercise or by giving medicine that makes the heart beat  faster. TREATMENT   Treatment depends on what may be causing your chest pain. Treatment may include:  Acid blockers for heartburn.  Anti-inflammatory medicine.  Pain medicine for inflammatory conditions.  Antibiotics if an infection is present.  You may be advised to change lifestyle habits. This includes stopping smoking and avoiding alcohol, caffeine, and chocolate.  You may be advised to keep your head raised (elevated) when sleeping. This reduces the chance of acid going backward from your stomach into your esophagus. Most of the time, nonspecific chest pain will improve within 2-3 days with rest and mild pain medicine.  HOME CARE INSTRUCTIONS   If antibiotics were prescribed, take them as directed. Finish them even if you start to feel better.  For the next few days, avoid physical activities that bring on chest pain. Continue physical activities as directed.  Do not use any tobacco products, including cigarettes, chewing tobacco, or electronic cigarettes.  Avoid drinking alcohol.  Only take medicine as directed by your health care provider.  Follow your health care provider's suggestions for further testing if your chest pain does not go away.  Keep any follow-up appointments you made. If you do not go to an appointment, you could develop lasting (chronic) problems with pain. If there is any problem keeping an appointment, call to reschedule. SEEK MEDICAL CARE IF:   Your chest pain does not go away, even after treatment.  You have a rash with blisters on your chest.  You have a fever. SEEK IMMEDIATE MEDICAL CARE IF:   You have increased chest pain or pain that spreads to your  arm, neck, jaw, back, or abdomen.  You have shortness of breath.  You have an increasing cough, or you cough up blood.  You have severe back or abdominal pain.  You feel nauseous or vomit.  You have severe weakness.  You faint.  You have chills. This is an emergency. Do not wait to  see if the pain will go away. Get medical help at once. Call your local emergency services (911 in [place] Do not drive yourself to the hospital. MAKE SURE YOU:   Understand these instructions.  Will watch your condition.  Will get help right away if you are not doing well or get worse. Document Released: 08/20/2005 Document Revised: 11/15/2013 Document Reviewed: 06/15/2008 [facility] Patient Information 2015 [place], [place]. This information is not intended to replace advice given to you by your health care provider. Make sure you discuss any questions you have with your health care provider.

## 2015-05-26 NOTE — ED Notes (Addendum)
Pt arrives via EMS from home, [facility] CP. 324 MG ASA given by EMS. EMS EKG NSR.  Took quetiapine 200MG , started having CP shortly after, sharp and pressure. Non radiating

## 2015-05-26 NOTE — ED Notes (Signed)
Blood drawn by lab.  Pt to xray for chest films.

## 2015-05-26 NOTE — ED Notes (Signed)
Returned from [facility].  Pt reports pain unchanged.  Awaiting test results.

## 2015-05-26 NOTE — ED Provider Notes (Signed)
CSN: [id number]     Arrival date & time 05/26/15  [id number] History   First MD Initiated Contact with Patient 05/26/15 [phone]     Chief Complaint  Patient presents with  . Chest Pain     (Consider location/radiation/quality/duration/timing/severity/associated sxs/prior Treatment) HPI Rodney Thomas is a 24 y.o. male history of seasonal allergies, presents to emergency department complaining of chest pain. Patient states he was still up from last night, states he was getting ready to get a bed when suddenly began having left-sided chest pain. States pain started approximately 2 hours after eating and shortly after taking quetiapine. He states pain is sharp, constant, does not radiate. It is not worsened with movement, laying down, sitting up, to breathing. He states he feels like he may be short of breath. Denies nausea, dizziness, back pain, abdominal pain. Patient states they gave him aspirin by EMS which helped his pain some. He denies any other complaints. He denies history of similar pain in the past. He denies recent illness. [facility].   Past Medical History  Diagnosis Date  . Seasonal allergies    Past Surgical History  Procedure Laterality Date  . Appendectomy    . Orif radial fracture Left 03/17/2014    Procedure: OPEN REDUCTION INTERNAL FIXATION (ORIF) RADIAL FRACTURE;  Surgeon: Eldred MangesMark C Yates, MD;  Location: [facility] OR;  Service: Orthopedics;  Laterality: Left;  Open Reduction Internal Fixation Left Radial Shaft Fracture   No family history on file. History  Substance Use Topics  . Smoking status: Never Smoker   . Smokeless tobacco: Never Used  . Alcohol Use: No    Review of Systems  Constitutional: Negative for fever and chills.  Respiratory: Positive for chest tightness and shortness of breath. Negative for cough.   Cardiovascular: Positive for chest pain. Negative for palpitations and leg swelling.  Gastrointestinal: Negative for nausea, vomiting,  abdominal pain, diarrhea and abdominal distention.  Genitourinary: Negative for dysuria, urgency, frequency and hematuria.  Musculoskeletal: Negative for myalgias, arthralgias, neck pain and neck stiffness.  Skin: Negative for rash.  Allergic/Immunologic: Negative for immunocompromised state.  Neurological: Negative for dizziness, weakness, light-headedness, numbness and headaches.  All other systems reviewed and are negative.     Allergies  Review of patient's allergies indicates no known allergies.  Home Medications   Prior to Admission medications   Medication Sig Start Date End Date Taking? Authorizing Provider  QUEtiapine (SEROQUEL) 300 MG tablet Take 200 mg by mouth at bedtime.   Yes Historical Provider, MD  oxyCODONE-acetaminophen (ROXICET) 5-325 MG per tablet Take 1-2 tablets by mouth every 4 (four) hours as needed. Patient not taking: Reported on 05/26/2015 03/17/14   Maud DeedSheila Vernon, PA-C   BP 135/82 mmHg  Pulse 77  Temp(Src) 97.4 F (36.3 C) (Oral)  Resp 18  SpO2 100% Physical Exam  Constitutional: He appears well-developed and well-nourished. No distress.  HENT:  Head: Normocephalic and atraumatic.  Eyes: Conjunctivae are normal.  Neck: Neck supple.  Cardiovascular: Normal rate, regular rhythm and normal heart sounds.   Pulmonary/Chest: Effort normal. No respiratory distress. He has no wheezes. He has no rales. He exhibits tenderness.  Tenderness over left lateral chest, no bruising, swelling, rashes noted. no pain with range of motion of the left arm.  Abdominal: Soft. Bowel sounds are normal. He exhibits no distension. There is no tenderness. There is no rebound.  Musculoskeletal: He exhibits no edema.  Neurological: He is alert.  Skin: Skin is warm and dry.  Nursing note and vitals reviewed.   ED Course  Procedures (including critical care time) Labs Review Labs Reviewed  CBC  BASIC METABOLIC PANEL  I-STAT TROPOININ, ED    Imaging Review Dg Chest 2  View  05/26/2015   CLINICAL DATA:  Chest pain, worsening since last night.  EXAM: CHEST  2 VIEW  COMPARISON:  08/06/2011  FINDINGS: The heart size and mediastinal contours are within normal limits. Both lungs are clear. The visualized skeletal structures are unremarkable.  IMPRESSION: No active cardiopulmonary disease.   Electronically Signed   By: Charlett Nose M.D.   On: 05/26/2015 07:49     EKG Interpretation   Date/Time:  Saturday May 26 2015 07:03:48 EDT Ventricular Rate:  68 PR Interval:  126 QRS Duration: 86 QT Interval:  391 QTC Calculation: 416 R Axis:   66 Text Interpretation:  Sinus rhythm Probable left ventricular hypertrophy  No significant change since last tracing Confirmed by ZACKOWSKI  MD, SCOTT  ([id number]) on 05/26/2015 7:18:08 AM      MDM   Final diagnoses:  Chest pain, unspecified chest pain type    Pt with sudden onset of left sided cp, that is sharp, no radiation, not pleuritic, not associated with movement or position. EKG unchanged from the past, sinus rhythm with possible ventricular hypertrophy. His vital signs are normal. His heart rate and oxygen saturation are normal, he is PERC criteria Wells criteria negative. He is afebrile. Chest pain is reproducible with palpation. I doubt coronary syndrome. Most likely musculoskeletal versus GERD. Given Toradol 30 mg IM for pain. Home with ibuprofen and Tylenol. [facility] with [facility].  Filed Vitals:   05/26/15 0708  BP: 135/82  Pulse: 77  Temp: 97.4 F (36.3 C)  Resp: [id number] PA-C 05/26/15 1522  Vanetta Mulders, MD 05/31/15 [phone]

## 2015-12-26 ENCOUNTER — Emergency Department (HOSPITAL_COMMUNITY)
Admission: EM | Admit: 2015-12-26 | Discharge: 2015-12-26 | Disposition: A | Discharge status: Home / Self Care | Payer: Self-pay | Attending: Emergency Medicine | Admitting: Emergency Medicine

## 2015-12-26 ENCOUNTER — Emergency Department (HOSPITAL_COMMUNITY): Payer: Self-pay

## 2015-12-26 ENCOUNTER — Encounter (HOSPITAL_COMMUNITY): Payer: Self-pay | Admitting: Emergency Medicine

## 2015-12-26 DIAGNOSIS — M940 Chondrocostal junction syndrome [Tietze]: Secondary | ICD-10-CM | POA: Insufficient documentation

## 2015-12-26 DIAGNOSIS — Z8709 Personal history of other diseases of the respiratory system: Secondary | ICD-10-CM | POA: Insufficient documentation

## 2015-12-26 DIAGNOSIS — Z791 Long term (current) use of non-steroidal anti-inflammatories (NSAID): Secondary | ICD-10-CM | POA: Insufficient documentation

## 2015-12-26 DIAGNOSIS — Z79899 Other long term (current) drug therapy: Secondary | ICD-10-CM | POA: Insufficient documentation

## 2015-12-26 LAB — CBC
HEMATOCRIT: 47.8 % (ref 39.0–52.0)
Hemoglobin: 16.1 g/dL (ref 13.0–17.0)
MCH: 28.1 pg (ref 26.0–34.0)
MCHC: 33.7 g/dL (ref 30.0–36.0)
MCV: 83.4 fL (ref 78.0–100.0)
PLATELETS: 233 10*3/uL (ref 150–400)
RBC: 5.73 MIL/uL (ref 4.22–5.81)
RDW: 15 % (ref 11.5–15.5)
WBC: 6.3 10*3/uL (ref 4.0–10.5)

## 2015-12-26 LAB — BASIC METABOLIC PANEL
Anion gap: 11 (ref 5–15)
BUN: 12 mg/dL (ref 6–20)
CHLORIDE: 103 mmol/L (ref 101–111)
CO2: 28 mmol/L (ref 22–32)
Calcium: 9.8 mg/dL (ref 8.9–10.3)
Creatinine, Ser: 1.11 mg/dL (ref 0.61–1.24)
GFR calc Af Amer: >60 mL/min (ref >60)
GFR calc non Af Amer: >60 mL/min (ref >60)
GLUCOSE: 85 mg/dL (ref 65–99)
POTASSIUM: 3.7 mmol/L (ref 3.5–5.1)
Sodium: 142 mmol/L (ref 135–145)

## 2015-12-26 LAB — I-STAT TROPONIN, ED: Troponin i, poc: 0 ng/mL (ref 0.00–0.08)

## 2015-12-26 MED ORDER — PREDNISONE 50 MG PO TABS: 50.0000 mg | ORAL_TABLET | Freq: Every day | ORAL | Status: DC

## 2015-12-26 MED ORDER — TRAMADOL HCL 50 MG PO TABS: 50.0000 mg | ORAL_TABLET | Freq: Four times a day (QID) | ORAL | Status: DC | PRN

## 2015-12-26 NOTE — ED Notes (Signed)
Pt verbalizes understanding of discharge instructions. prescriptions given to pt x2. Ambulatory at discharge.

## 2015-12-26 NOTE — ED Notes (Signed)
PA at bedside.

## 2015-12-26 NOTE — ED Notes (Signed)
Pt reports left sided CP x 2 months. Pt alert x4. NAD at this time.

## 2015-12-26 NOTE — Discharge Instructions (Signed)
Return here as needed.  Your testing here today did not show any abnormality.  Use heat on your chest wall

## 2015-12-26 NOTE — ED Provider Notes (Signed)
CSN: [id number]     Arrival date & time 12/26/15  1058 History   First MD Initiated Contact with Patient 12/26/15 1157     Chief Complaint  Patient presents with  . Chest Pain     (Consider location/radiation/quality/duration/timing/severity/associated sxs/prior Treatment) HPI Patient presents to the emergency department with a 2 month history of left sided chest pain.  Patient, states, that has been constant for most of the last 2 months.  The patient denies any injuries or trauma.  Patient states that pain is worse with movement, palpation, deep breathing and coughing.  The patient states that he does not find any thing that relieves his symptoms.  Patient states that he took Tylenol naproxen without relief.  The patient states that he has not had any shortness of breath nausea, vomiting, weakness, dizziness, headache, blurred vision, back pain, dysuria, numbness, weakness, cough, fever or syncope.  The patient states that nothing seems to make the condition better  Past Medical History  Diagnosis Date  . Seasonal allergies    Past Surgical History  Procedure Laterality Date  . Appendectomy    . Orif radial fracture Left 03/17/2014    Procedure: OPEN REDUCTION INTERNAL FIXATION (ORIF) RADIAL FRACTURE;  Surgeon: Eldred Manges, MD;  Location: [facility] OR;  Service: Orthopedics;  Laterality: Left;  Open Reduction Internal Fixation Left Radial Shaft Fracture   No family history on file. Social History  Substance Use Topics  . Smoking status: Never Smoker   . Smokeless tobacco: Never Used  . Alcohol Use: No    Review of Systems All other systems negative except as documented in the HPI. All pertinent positives and negatives as reviewed in the HPI.   Allergies  Review of patient's allergies indicates no known allergies.  Home Medications   Prior to Admission medications   Medication Sig Start Date End Date Taking? Authorizing Provider  acetaminophen (TYLENOL) 500 MG tablet Take 500 mg by  mouth every 6 (six) hours as needed.   Yes Historical Provider, MD  Naproxen Sodium (ALEVE) 220 MG CAPS Take 220 mg by mouth 2 (two) times daily as needed (pain).   Yes Historical Provider, MD  naproxen sodium (ANAPROX) 220 MG tablet Take 220 mg by mouth 2 (two) times daily with a meal.   Yes Historical Provider, MD  QUEtiapine (SEROQUEL) 300 MG tablet Take 300 mg by mouth at bedtime.    Yes Historical Provider, MD   BP 128/83 mmHg  Pulse 52  Temp(Src) 97.6 F (36.4 C) (Oral)  Resp 17  Ht 6' (1.829 m)  Wt 74.844 kg  BMI 22.37 kg/m2  SpO2 100% Physical Exam  Constitutional: He is oriented to person, place, and time. He appears well-developed and well-nourished. No distress.  HENT:  Head: Normocephalic and atraumatic.  Mouth/Throat: Oropharynx is clear and moist.  Eyes: Pupils are equal, round, and reactive to light.  Neck: Normal range of motion. Neck supple.  Cardiovascular: Normal rate, regular rhythm and normal heart sounds.  Exam reveals no gallop and no friction rub.   No murmur heard. Pulmonary/Chest: Effort normal and breath sounds normal. No respiratory distress. He has no wheezes. He exhibits tenderness.  Neurological: He is alert and oriented to person, place, and time. He exhibits normal muscle tone. Coordination normal.  Skin: Skin is warm and dry. No rash noted. No erythema.  Psychiatric: He has a normal mood and affect. His behavior is normal.  Nursing note and vitals reviewed.   ED Course  Procedures (including  critical care time) Labs Review Labs Reviewed  BASIC METABOLIC PANEL  CBC  I-STAT TROPOININ, ED    Imaging Review Dg Chest 2 View  12/26/2015  CLINICAL DATA:  Left-sided chest pain and cough for 3 months. EXAM: CHEST  2 VIEW COMPARISON:  May 26, 2015. FINDINGS: The heart size and mediastinal contours are within normal limits. Both lungs are clear. No pneumothorax or pleural effusion is noted. The visualized skeletal structures are unremarkable. IMPRESSION:  No active cardiopulmonary disease. Electronically Signed   By: Lupita Raider, M.D.   On: 12/26/2015 12:17   I have personally reviewed and evaluated these images and lab results as part of my medical decision-making.   EKG Interpretation   Date/Time:  Wednesday December 26 2015 11:01:53 EST Ventricular Rate:  49 PR Interval:  138 QRS Duration: 102 QT Interval:  434 QTC Calculation: 392 R Axis:   85 Text Interpretation:  Sinus bradycardia with sinus arrhythmia Left  ventricular hypertrophy Early repolarization Abnormal ECG ED PHYSICIAN  INTERPRETATION AVAILABLE IN [facility] Confirmed by TEST, Record  ([id number]) on 12/27/2015 7:03:55 AM      Patient is very specific chest wall discomfort on palpation.  Patient be treated for costochondritis.  Told to return here as needed.  Patient agrees the plan and all questions were answered.  She is specifically tender along the sternal border on the left    Charlestine Night, PA-C 12/27/15 1956  Vanetta Mulders, MD 12/28/15 2239

## 2016-11-28 IMAGING — DX DG CHEST 2V
2 series · 2 of 2 positions shown · non-contrast
Comparison: May 26, 2015.

CLINICAL DATA: Left-sided chest pain and cough for 3 months.

EXAM:
CHEST  2 VIEW

[chest pa]
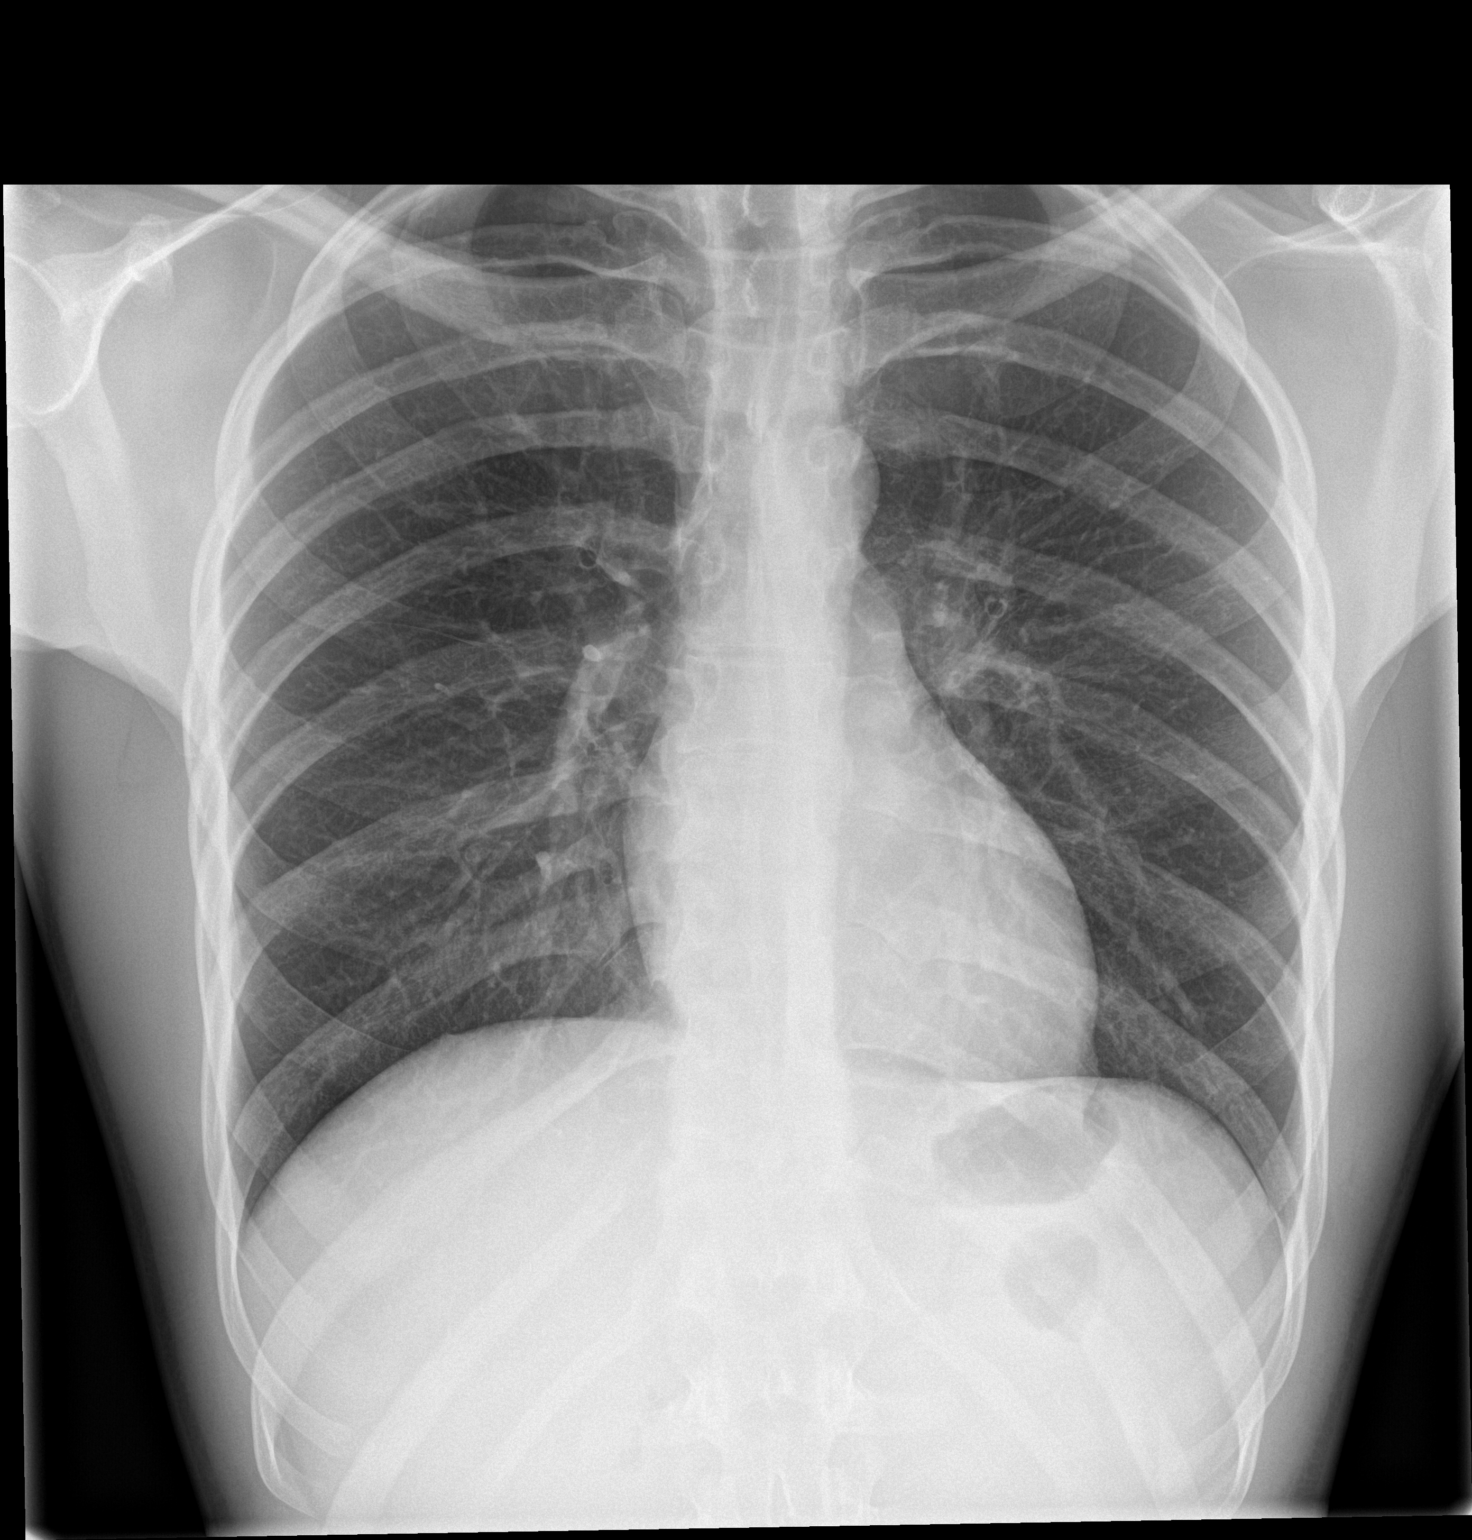

[chest lat]
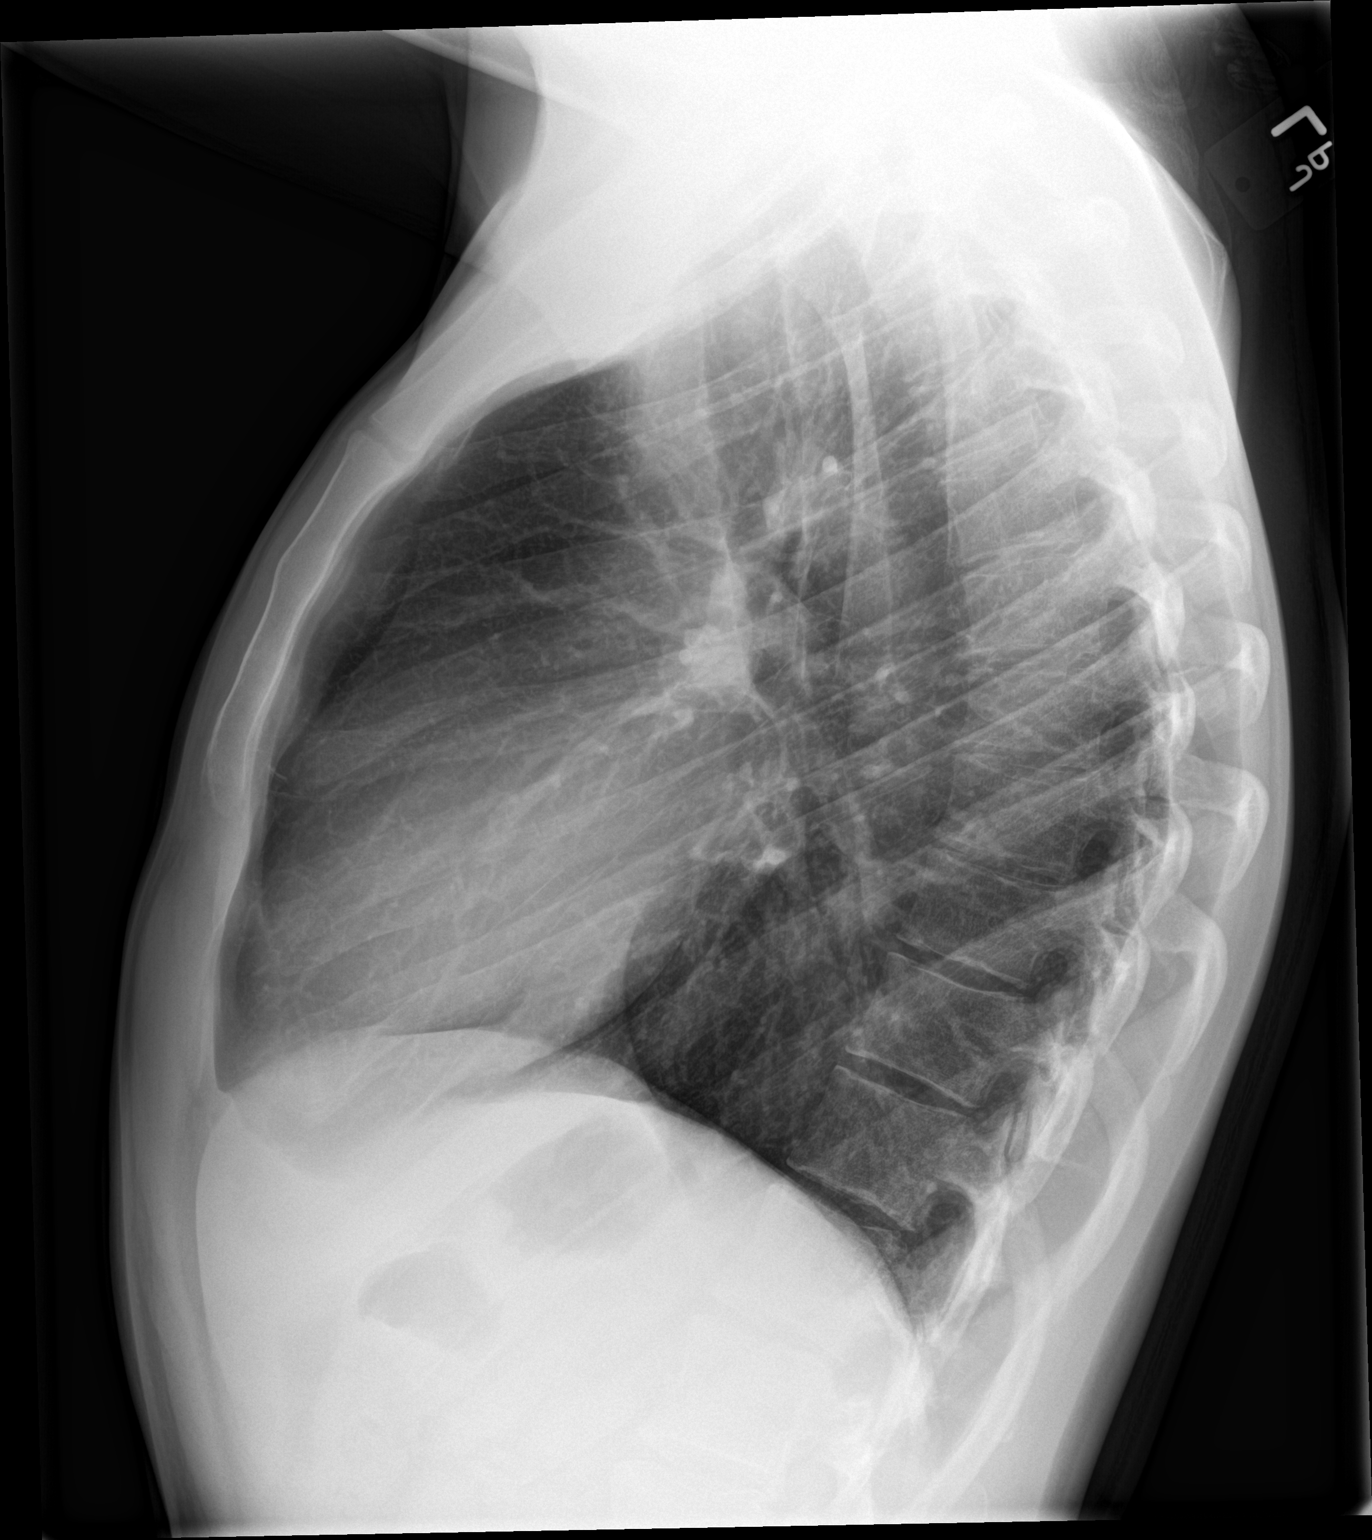

[2 of 2 positions shown; findings below may reference images not displayed]

FINDINGS: The heart size and mediastinal contours are within normal limits.
Both lungs are clear. No pneumothorax or pleural effusion is noted.
The visualized skeletal structures are unremarkable.
IMPRESSION: No active cardiopulmonary disease.

## 2024-01-14 ENCOUNTER — Encounter (HOSPITAL_COMMUNITY): Payer: Self-pay | Admitting: Emergency Medicine

## 2024-01-14 ENCOUNTER — Emergency Department (HOSPITAL_COMMUNITY)
Admission: EM | Admit: 2024-01-14 | Discharge: 2024-01-15 | Disposition: A | Discharge status: Other Acute Inpatient Hospital | Payer: Self-pay | Attending: Emergency Medicine | Admitting: Emergency Medicine

## 2024-01-14 DIAGNOSIS — F411 Generalized anxiety disorder: Secondary | ICD-10-CM | POA: Insufficient documentation

## 2024-01-14 DIAGNOSIS — F22 Delusional disorders: Secondary | ICD-10-CM | POA: Insufficient documentation

## 2024-01-14 DIAGNOSIS — F419 Anxiety disorder, unspecified: Secondary | ICD-10-CM | POA: Insufficient documentation

## 2024-01-14 DIAGNOSIS — Z79899 Other long term (current) drug therapy: Secondary | ICD-10-CM | POA: Insufficient documentation

## 2024-01-14 DIAGNOSIS — R45851 Suicidal ideations: Secondary | ICD-10-CM | POA: Insufficient documentation

## 2024-01-14 LAB — RAPID URINE DRUG SCREEN, HOSP PERFORMED
Amphetamines: NOT DETECTED
Barbiturates: NOT DETECTED
Benzodiazepines: NOT DETECTED
Cocaine: NOT DETECTED
Opiates: NOT DETECTED
Tetrahydrocannabinol: NOT DETECTED

## 2024-01-14 LAB — CBC
HCT: 45.3 % (ref 39.0–52.0)
Hemoglobin: 14.6 g/dL (ref 13.0–17.0)
MCH: 27.8 pg (ref 26.0–34.0)
MCHC: 32.2 g/dL (ref 30.0–36.0)
MCV: 86.3 fL (ref 80.0–100.0)
Platelets: 218 10*3/uL (ref 150–400)
RBC: 5.25 MIL/uL (ref 4.22–5.81)
RDW: 15.4 % (ref 11.5–15.5)
WBC: 12.3 10*3/uL — ABNORMAL HIGH (ref 4.0–10.5)
nRBC: 0 % (ref 0.0–0.2)

## 2024-01-14 LAB — ETHANOL: Alcohol, Ethyl (B): <10 mg/dL (ref <10)

## 2024-01-14 LAB — COMPREHENSIVE METABOLIC PANEL
ALT: 36 U/L (ref 0–44)
AST: 32 U/L (ref 15–41)
Albumin: 4.2 g/dL (ref 3.5–5.0)
Alkaline Phosphatase: 53 U/L (ref 38–126)
Anion gap: 8 (ref 5–15)
BUN: 18 mg/dL (ref 6–20)
CO2: 27 mmol/L (ref 22–32)
Calcium: 9.1 mg/dL (ref 8.9–10.3)
Chloride: 107 mmol/L (ref 98–111)
Creatinine, Ser: 0.86 mg/dL (ref 0.61–1.24)
GFR, Estimated: >60 mL/min (ref >60)
Glucose, Bld: 100 mg/dL — ABNORMAL HIGH (ref 70–99)
Potassium: 3.6 mmol/L (ref 3.5–5.1)
Sodium: 142 mmol/L (ref 135–145)
Total Bilirubin: 0.7 mg/dL (ref 0.0–1.2)
Total Protein: 7.2 g/dL (ref 6.5–8.1)

## 2024-01-14 LAB — ACETAMINOPHEN LEVEL: Acetaminophen (Tylenol), Serum: <10 ug/mL — ABNORMAL LOW (ref 10–30)

## 2024-01-14 LAB — SALICYLATE LEVEL: Salicylate Lvl: <7 mg/dL — ABNORMAL LOW (ref 7.0–30.0)

## 2024-01-14 NOTE — ED Triage Notes (Signed)
 Pt here from home with c/o hearing voices ,Si thoughts and being paranoid, cooperative in triage

## 2024-01-14 NOTE — ED Notes (Signed)
 Pt has 2 belonging bags placed in cabinet behind the 9-25 nursing station

## 2024-01-14 NOTE — ED Provider Notes (Addendum)
 [place] EMERGENCY DEPARTMENT AT [facility] Provider Note   CSN: [id number] Arrival date & time: 01/14/24  1515     History  Chief Complaint  Patient presents with   Suicidal    Rodney Thomas is a 33 y.o. male.  With no documented psychiatric history presents to the ED for paranoia.  Patient was recently imprisoned for 45 days and was released 3 days ago.  Paranoid delusions and auditory hallucinations have increased since then.  He has been dealing with this since he was 89 but has never talked to mental health professional or been on medication for this reason.  Voices are telling him to end his life.  He has not made any attempts to harm himself.  Denies overdose cutting.  No homicidal ideations.  Feels as though his mother has bipolar disorder.  No other family history of psychiatric illness.  No systemic complaints at this time.  No drug or alcohol use  HPI     Home Medications Prior to Admission medications   Medication Sig Start Date End Date Taking? Authorizing Provider  predniSONE (DELTASONE) 50 MG tablet Take 1 tablet (50 mg total) by mouth daily. Patient not taking: Reported on 01/14/2024 12/26/15   Charlestine Night, PA-C  traMADol (ULTRAM) 50 MG tablet Take 1 tablet (50 mg total) by mouth every 6 (six) hours as needed. Patient not taking: Reported on 01/14/2024 12/26/15   Charlestine Night, PA-C      Allergies    Patient has no known allergies.    Review of Systems   Review of Systems  Physical Exam Updated Vital Signs BP 129/85 (BP Location: Right Arm)   Pulse 88   Temp (!) 97.4 F (36.3 C) (Oral)   Resp 17   Ht 5\' 11"  (1.803 m)   Wt 68 kg   SpO2 100%   BMI 20.92 kg/m  Physical Exam Vitals and nursing note reviewed.  HENT:     Head: Normocephalic and atraumatic.  Eyes:     Pupils: Pupils are equal, round, and reactive to light.  Cardiovascular:     Rate and Rhythm: Normal rate and regular rhythm.  Pulmonary:     Effort: Pulmonary  effort is normal.     Breath sounds: Normal breath sounds.  Abdominal:     Palpations: Abdomen is soft.     Tenderness: There is no abdominal tenderness.  Skin:    General: Skin is warm and dry.  Neurological:     Mental Status: He is alert.  Psychiatric:        Mood and Affect: Mood normal.        Behavior: Behavior normal.     ED Results / Procedures / Treatments   Labs (all labs ordered are listed, but only abnormal results are displayed) Labs Reviewed  COMPREHENSIVE METABOLIC PANEL - Abnormal; Notable for the following components:      Result Value   Glucose, Bld 100 (*)    All other components within normal limits  SALICYLATE LEVEL - Abnormal; Notable for the following components:   Salicylate Lvl <7.0 (*)    All other components within normal limits  ACETAMINOPHEN LEVEL - Abnormal; Notable for the following components:   Acetaminophen (Tylenol), Serum <10 (*)    All other components within normal limits  CBC - Abnormal; Notable for the following components:   WBC 12.3 (*)    All other components within normal limits  ETHANOL  RAPID URINE DRUG SCREEN, HOSP PERFORMED  EKG None  Radiology No results found.  Procedures Procedures    Medications Ordered in ED Medications - No data to display  ED Course/ Medical Decision Making/ A&P Clinical Course as of 01/14/24 2213  Thu Jan 14, 2024  1717 No significant abnormalities on laboratory workup.  UDS alcohol ethanol salicylate all negative.  Medically cleared for psychiatric evaluation [MP]  1931 Patient was deferred to IRIS for a telepsych assessment. The assigned care coordinator will provide updates regarding the scheduling of the assessment. IRIS coordinator can be reached at [phone] for further information on the timing of the telepsych evaluation [MP]  2211 Patient has been evaluated by TTS.  Recommendation for inpatient psychiatric care.  Plan for continued boarding in the ED awaiting inpatient  psychiatric placement when a bed becomes available [MP]    Clinical Course User Index [MP] Royanne Foots, DO                                 Medical Decision Making 33 year old male with history as above presenting for acute worsening of chronic paranoid delusions.  Auditory command hallucinations telling him to end his life.  Recent imprisonment.  No drug alcohol use.  Calm and cooperative on my assessment.  Will obtain screening laboratory workup and TTS evaluation here in the ED.  Patient is here under his own volition no IVC required at this time  Amount and/or Complexity of Data Reviewed Labs: ordered.           Final Clinical Impression(s) / ED Diagnoses Final diagnoses:  Suicidal ideation  Paranoid delusion [facility])    Rx / DC Orders ED Discharge Orders     None         Royanne Foots, DO 01/14/24 1718    Royanne Foots, DO 01/14/24 2213

## 2024-01-14 NOTE — Consult Note (Signed)
 [facility] Psychiatric Consult Initial  Patient Name: .Rodney Thomas  MRN: [id number]  DOB: June 12, 1991  Consult Order details:  Orders (From admission, onward)     Start     Ordered   01/14/24 1630  CONSULT TO CALL ACT TEAM       Ordering Provider: Royanne Foots, DO  Provider:  (Not yet assigned)  Question:  Reason for Consult?  Answer:  Psych consult   01/14/24 1629             Mode of Visit: In person    Psychiatry Consult Evaluation  Service Date: January 14, 2024 LOS:  LOS: 0 days  Chief Complaint paranoia  Primary Psychiatric Diagnoses  Paranoia  2.   Anxiety  Assessment  Rodney Thomas is a 33 y.o. male admitted: Presented to the ED on 01/14/2024  3:18 PM for paranoia. He carries the psychiatric diagnoses of none and he has a past medical history of none.   His current presentation of suspicious behavior, and suicidal ideations is most consistent with paranoid disorder. He meets criteria for inpatient psychiatric admission based on paranoid behavior.  Current outpatient psychotropic medications include none. On initial examination, patient remains paranoid but pleasant and cooperative. Please see plan below for detailed recommendations.   Diagnoses:  Active Hospital problems: Principal Problem:   Paranoia [facility]) Active Problems:   Anxiety state    Plan   ## Psychiatric Medication Recommendations:  Start Atarax 25 mg PO TID PRN for anxiety  ## Medical Decision Making Capacity: Not specifically addressed in this encounter  ## Further Work-up:  EKG or UDS -- updated EKG ordered on 01/14/24 -- Pertinent labwork reviewed earlier this admission includes: CMP, CBC, EKG, UDS   ## Disposition:--We recommend inpatient psychiatric hospitalization. Patient is under voluntary admission status at this time; please IVC if attempts to leave hospital.  ## Behavioral / Environmental: -To minimize splitting of staff, assign one staff person to communicate all  information from the team when feasible. or Utilize compassion and acknowledge the patient's experiences while setting clear and realistic expectations for care.    ## Safety and Observation Level:  - Based on my clinical evaluation, I estimate the patient to be at low risk of self harm in the current setting. - At this time, we recommend  routine. This decision is based on my review of the chart including patient's history and current presentation, interview of the patient, mental status examination, and consideration of suicide risk including evaluating suicidal ideation, plan, intent, suicidal or self-harm behaviors, risk factors, and protective factors. This judgment is based on our ability to directly address suicide risk, implement suicide prevention strategies, and develop a safety plan while the patient is in the clinical setting. Please contact our team if there is a concern that risk level has changed.  CSSR Risk Category:C-SSRS RISK CATEGORY: High Risk  Suicide Risk Assessment: Patient has following modifiable risk factors for suicide: active suicidal ideation, social isolation, medication noncompliance, and lack of access to outpatient mental health resources, which we are addressing by recommending inpatient psychiatric admission. Patient has following non-modifiable or demographic risk factors for suicide: male gender Patient has the following protective factors against suicide: Supportive family  Thank you for this consult request. Recommendations have been communicated to the primary team.  We will recommend inpatient psychiatric admission at this time.   Fatih Stalvey MOTLEY-MANGRUM, PMHNP       History of Present Illness  Relevant Aspects of Hospital ED Course:  Admitted on  01/14/2024 for paranoia.   Patient Report:  Rodney Thomas, 33 y.o., male patient seen face to face by this provider, consulted with Dr. Woodroe Mode; and chart reviewed on 01/14/24.  On evaluation Rodney Thomas  reports that he is feeling paranoid, states he feels that everyone is after him, feels this way every single day.  He states he has been feeling this way since the age of 43, and never sought out any help, as his mother told him that he should.  He states his day starts off as him feeling paranoid about everything, including food, and feeling that people are out to get him.  He states that he isolates himself, that is how he has been coping since the age of 84.  He states he recently was released from jail on Monday January 11, 2024, states he was there for 45 days and went to jail because he was communicating threats to his child's mother.  He states while he was there in jail his paranoia and hallucinations became worse, states that he was feeling that officers in the jail might plan evidence against him because he kept trying to tell them that he was not a murderer, said they became suspicious of him and he became very scared and anxious.  He states he was placed on suicide watch several times in jail due to the anxiety and paranoia.  He states he has never received any therapy or outpatient psychiatry for his mental health.  He currently endorses auditory and visual hallucinations, stating that he sees in all black shadow, that tells him to kill himself or tells him that people are trying to hurt him.  Patient also was endorsing SI, stating his plan will be to suffocate or drown himself states he has been feeling like this over his lifetime and his plan is always been to either suffocate or drown.  Patient denies HI.  Patient denies using any drugs or alcohol, but due to feeling that it will automatically kill him, patient also denies taking any psychiatric medications with feeling of the same way.  Patient UDS and BAL are negative.  Patient states he feels he has nothing to live for, because when he communicated threats to his child's mother she took their 66-year-old child away from him, he states his daughter  was the only thing he felt keeping him sane.  Patient states that his mother is very supportive, he also states that he has an identical twin brother. 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 Patient states appetite and sleep are fair.   During evaluation Rodney Thomas is sitting up in his gurney, staring around, feeling that he is being watched, he appears to be in moderate distress and anxious. He is alert and oriented x 3, with anxious mood with congruent affect.  He has normal speech, but rapid, and paranoid behavior.  Patient is able to converse coherently, goal directed thoughts, no distractibility, or pre-occupation. He currently endorses auditory and visual hallucinations, stating that he sees in all black shadow, that tells him to kill himself or tells him that people are trying to hurt him.  Patient also was endorsing SI, stating his plan will be to suffocate or drown himself states he has been feeling like this over his lifetime and his plan is always been to either suffocate or drown.  Patient denies HI.  Per chart review patient has no psychiatric history,  we will attempt to contact mom with patient's permission.   Psych ROS:  Depression: Positive Anxiety: Positive Mania (lifetime and current): Denies Psychosis: (lifetime and current): Denies  Collateral information:  Attempted to contact patient's mother Constance Goltz, no answer will attempt again.  Review of Systems  Psychiatric/Behavioral:  Positive for depression, hallucinations and suicidal ideas.      Psychiatric and Social History  Psychiatric History:  Information collected from patient  Prev Dx/Sx: None Current Psych Provider: None Home Meds (current): None Previous Med Trials: None Therapy: None  Prior Psych Hospitalization: None  Prior Self Harm: Denies Prior Violence: Denies  Family Psych History:  Denies Family Hx suicide: Denies  Social History:  Developmental Hx: Patient appears age appropriate Educational Hx: Patient graduated high school Occupational Hx: Patient is unemployed Armed forces operational officer Hx: Yes Living Situation: Homeless Spiritual Hx: Denies Access to weapons/lethal means: Denies  Substance History Patient denies any substance use history, as he feels that substances will kill him equally faster than most people.  Patient states he has never had any alcohol, never smoked a cigarette never had any illicit substances.   Exam Findings  Physical Exam:  Vital Signs:  Temp:  [97.4 F (36.3 C)] 97.4 F (36.3 C) (02/20 1519) Pulse Rate:  [88] 88 (02/20 1519) Resp:  [17] 17 (02/20 1519) BP: (129)/(85) 129/85 (02/20 1519) SpO2:  [100 %] 100 % (02/20 1519) Weight:  [68 kg] 68 kg (02/20 1519) Blood pressure 129/85, pulse 88, temperature (!) 97.4 F (36.3 C), temperature source Oral, resp. rate 17, height 5\' 11"  (1.803 m), weight 68 kg, SpO2 100%. Body mass index is 20.92 kg/m.  Physical Exam Vitals and nursing note reviewed. Exam conducted with a chaperone present.  Neurological:     Mental Status: He is alert.  Psychiatric:        Attention and Perception: Attention normal.        Mood and Affect: Mood is anxious.        Speech: Speech is rapid and pressured.        Behavior: Behavior is cooperative.        Thought Content: Thought content is paranoid and delusional.        Judgment: Judgment is inappropriate.     Mental Status Exam: General Appearance: Casual  Orientation:  Full (Time, Place, and Person)  Memory:  Immediate;   Fair Remote;   Fair  Concentration:  Concentration: Fair and Attention Span: Fair  Recall:  Fair  Attention  Fair  Eye Contact:  Fair  Speech:  Clear and Coherent and Rapid rate  Language:  Fair  Volume:  Normal  Mood: anxious  Affect:  Appropriate  Thought Process:  Coherent  Thought Content:  Delusions, Hallucinations: Auditory Visual,  and Paranoid Ideation  Suicidal Thoughts:  Yes.  with intent/plan  Homicidal Thoughts:  No  Judgement:  Poor  Insight:  Lacking  Psychomotor Activity:  Normal  Akathisia:  No  Fund of Knowledge:  Fair      Assets:  Manufacturing systems engineer Desire for Improvement Social Support  Cognition:  Impaired,  Mild  ADL's:  Intact  AIMS (if indicated):        Other History   These have been pulled in through the EMR, reviewed, and updated if appropriate.  Family History:  The patient's family history is not on file.  Medical History: Past Medical History:  Diagnosis Date   Seasonal allergies     Surgical History: Past Surgical History:  Procedure Laterality  Date   APPENDECTOMY     ORIF RADIAL FRACTURE Left 03/17/2014   Procedure: OPEN REDUCTION INTERNAL FIXATION (ORIF) RADIAL FRACTURE;  Surgeon: Eldred Manges, MD;  Location: [facility] OR;  Service: Orthopedics;  Laterality: Left;  Open Reduction Internal Fixation Left Radial Shaft Fracture     Medications:  No current facility-administered medications for this encounter.  Current Outpatient Medications:    predniSONE (DELTASONE) 50 MG tablet, Take 1 tablet (50 mg total) by mouth daily. (Patient not taking: Reported on 01/14/2024), Disp: 5 tablet, Rfl: 0   traMADol (ULTRAM) 50 MG tablet, Take 1 tablet (50 mg total) by mouth every 6 (six) hours as needed. (Patient not taking: Reported on 01/14/2024), Disp: 15 tablet, Rfl: 0  Allergies: No Known Allergies  Takhia Spoon MOTLEY-MANGRUM, PMHNP

## 2024-01-15 ENCOUNTER — Encounter (HOSPITAL_COMMUNITY): Payer: Self-pay | Admitting: Psychiatry

## 2024-01-15 ENCOUNTER — Other Ambulatory Visit: Payer: Self-pay

## 2024-01-15 ENCOUNTER — Inpatient Hospital Stay (HOSPITAL_COMMUNITY)
Admission: AD | Admit: 2024-01-15 | Discharge: 2024-01-23 | DRG: 885 | Disposition: A | Discharge status: Home / Self Care | Payer: No Typology Code available for payment source | Source: Intra-hospital | Attending: Psychiatry | Admitting: Psychiatry

## 2024-01-15 DIAGNOSIS — R45851 Suicidal ideations: Secondary | ICD-10-CM | POA: Diagnosis present

## 2024-01-15 DIAGNOSIS — F5104 Psychophysiologic insomnia: Secondary | ICD-10-CM | POA: Diagnosis present

## 2024-01-15 DIAGNOSIS — G4709 Other insomnia: Secondary | ICD-10-CM | POA: Diagnosis not present

## 2024-01-15 DIAGNOSIS — Z560 Unemployment, unspecified: Secondary | ICD-10-CM | POA: Diagnosis not present

## 2024-01-15 DIAGNOSIS — F419 Anxiety disorder, unspecified: Secondary | ICD-10-CM | POA: Diagnosis present

## 2024-01-15 DIAGNOSIS — F250 Schizoaffective disorder, bipolar type: Principal | ICD-10-CM | POA: Diagnosis present

## 2024-01-15 DIAGNOSIS — E559 Vitamin D deficiency, unspecified: Secondary | ICD-10-CM | POA: Diagnosis present

## 2024-01-15 DIAGNOSIS — R4681 Obsessive-compulsive behavior: Secondary | ICD-10-CM | POA: Diagnosis present

## 2024-01-15 DIAGNOSIS — F429 Obsessive-compulsive disorder, unspecified: Secondary | ICD-10-CM | POA: Diagnosis present

## 2024-01-15 DIAGNOSIS — K529 Noninfective gastroenteritis and colitis, unspecified: Principal | ICD-10-CM | POA: Diagnosis present

## 2024-01-15 DIAGNOSIS — Z79899 Other long term (current) drug therapy: Secondary | ICD-10-CM

## 2024-01-15 DIAGNOSIS — Z818 Family history of other mental and behavioral disorders: Secondary | ICD-10-CM

## 2024-01-15 DIAGNOSIS — F22 Delusional disorders: Principal | ICD-10-CM | POA: Diagnosis present

## 2024-01-15 MED ORDER — HALOPERIDOL LACTATE 5 MG/ML IJ SOLN: 10.0000 mg | Freq: Three times a day (TID) | INTRAMUSCULAR | Status: DC | PRN

## 2024-01-15 MED ORDER — TRAZODONE HCL 50 MG PO TABS
50.0000 mg | ORAL_TABLET | Freq: Every evening | ORAL | Status: DC | PRN
  Administered 2024-01-15 – 2024-01-19 (×4): 50 mg via ORAL
  Filled 2024-01-15 (×3): qty 1

## 2024-01-15 MED ORDER — LORAZEPAM 2 MG/ML IJ SOLN: 2.0000 mg | Freq: Three times a day (TID) | INTRAMUSCULAR | Status: DC | PRN

## 2024-01-15 MED ORDER — DIPHENHYDRAMINE HCL 25 MG PO CAPS: 50.0000 mg | ORAL_CAPSULE | Freq: Three times a day (TID) | ORAL | Status: DC | PRN

## 2024-01-15 MED ORDER — HALOPERIDOL LACTATE 5 MG/ML IJ SOLN: 5.0000 mg | Freq: Three times a day (TID) | INTRAMUSCULAR | Status: DC | PRN

## 2024-01-15 MED ORDER — DIPHENHYDRAMINE HCL 50 MG/ML IJ SOLN: 50.0000 mg | Freq: Three times a day (TID) | INTRAMUSCULAR | Status: DC | PRN

## 2024-01-15 MED ORDER — HYDROXYZINE HCL 25 MG PO TABS
25.0000 mg | ORAL_TABLET | Freq: Three times a day (TID) | ORAL | Status: DC | PRN
  Administered 2024-01-15 – 2024-01-20 (×7): 25 mg via ORAL
  Filled 2024-01-15 (×7): qty 1

## 2024-01-15 MED ORDER — MAGNESIUM HYDROXIDE 400 MG/5ML PO SUSP: 30.0000 mL | Freq: Every day | ORAL | Status: DC | PRN

## 2024-01-15 MED ORDER — ALUM & MAG HYDROXIDE-SIMETH 200-200-20 MG/5ML PO SUSP
30.0000 mL | ORAL | Status: DC | PRN
  Administered 2024-01-20: 30 mL via ORAL
  Filled 2024-01-15: qty 30

## 2024-01-15 MED ORDER — ACETAMINOPHEN 325 MG PO TABS
650.0000 mg | ORAL_TABLET | Freq: Four times a day (QID) | ORAL | Status: DC | PRN
  Administered 2024-01-17: 650 mg via ORAL
  Filled 2024-01-15 (×2): qty 2

## 2024-01-15 MED ORDER — HALOPERIDOL 5 MG PO TABS: 5.0000 mg | ORAL_TABLET | Freq: Three times a day (TID) | ORAL | Status: DC | PRN

## 2024-01-15 NOTE — Progress Notes (Signed)
 Pt has been accepted to [facility] on 01/15/2024 Bed assignment: 503-1   Pt meets inpatient criteria per: Alona Bene, PMHNP   Attending Physician will be: Dr. Sarita Bottom, MD   Report can be called to: Adult unit: [phone]  Pt can arrive after 2 PM   Care Team Notified: [facility] RN, [facility] paramedic, Latricia Heft paramedic, Sherryl Manges RN, Cathlyn Parsons RN, Margorie John D" Addio RN   [place] Aryanna Shaver LCSW-A   01/15/2024 1:30 PM

## 2024-01-15 NOTE — Plan of Care (Signed)
  Problem: Education: Goal: Emotional status will improve Outcome: Progressing Goal: Mental status will improve Outcome: Progressing Goal: Verbalization of understanding the information provided will improve Outcome: Progressing   Problem: Safety: Goal: Periods of time without injury will increase Outcome: Progressing   

## 2024-01-15 NOTE — Plan of Care (Signed)
  Problem: Education: Goal: Emotional status will improve Outcome: Progressing Goal: Mental status will improve Outcome: Progressing Goal: Verbalization of understanding the information provided will improve Outcome: Progressing   Problem: Activity: Goal: Interest or engagement in activities will improve Outcome: Progressing   Problem: Safety: Goal: Periods of time without injury will increase Outcome: Progressing

## 2024-01-15 NOTE — Tx Team (Signed)
 Initial Treatment Plan 01/15/2024 5:32 PM Rodney Thomas UEA:[id number]    PATIENT STRESSORS: Legal issue   Marital or family conflict     PATIENT STRENGTHS: Ability for insight  Communication skills  Motivation for treatment/growth  Physical Health  Supportive family/friends    PATIENT IDENTIFIED PROBLEMS: Acute psychosis (suspicions, delusions, paranoia) "I am afraid that I am always doing something wrong and that others are out to hurt me"    Insomnia "I've been sleeping 3 hours a night because I'm so paranoid"    Ineffective coping skills (Unable to maintain healthy relationships with others)             DISCHARGE CRITERIA:  Improved stabilization in mood, thinking, and/or behavior Verbal commitment to aftercare and medication compliance  PRELIMINARY DISCHARGE PLAN: Outpatient therapy Return to previous living arrangement  PATIENT/FAMILY INVOLVEMENT: This treatment plan has been presented to and reviewed with the patient, Rodney Thomas.  The patient has been given the opportunity to ask questions and make suggestions.  Virgel Manifold, RN 01/15/2024, 5:32 PM
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
## 2024-01-15 NOTE — ED Notes (Signed)
 2 bags of belongings were secured in locker #34

## 2024-01-15 NOTE — Group Note (Signed)
 Date:  01/15/2024 Time:  9:12 PM  Group Topic/Focus:  Wrap-Up Group:   The focus of this group is to help patients review their daily goal of treatment and discuss progress on daily workbooks.    Participation Level:  Did Not Attend   Scot Dock 01/15/2024, 9:12 PM

## 2024-01-16 ENCOUNTER — Encounter (HOSPITAL_COMMUNITY): Payer: Self-pay | Admitting: Psychiatry

## 2024-01-16 DIAGNOSIS — F250 Schizoaffective disorder, bipolar type: Principal | ICD-10-CM | POA: Diagnosis present

## 2024-01-16 DIAGNOSIS — R4681 Obsessive-compulsive behavior: Secondary | ICD-10-CM | POA: Diagnosis present

## 2024-01-16 DIAGNOSIS — F5104 Psychophysiologic insomnia: Secondary | ICD-10-CM | POA: Diagnosis present

## 2024-01-16 HISTORY — DX: Schizoaffective disorder, bipolar type: F25.0

## 2024-01-16 HISTORY — DX: Psychophysiologic insomnia: F51.04

## 2024-01-16 HISTORY — DX: Obsessive-compulsive behavior: R46.81

## 2024-01-16 LAB — LIPID PANEL
Cholesterol: 164 mg/dL (ref 0–200)
HDL: 57 mg/dL (ref >40)
LDL Cholesterol: 72 mg/dL (ref 0–99)
Total CHOL/HDL Ratio: 2.9 ratio
Triglycerides: 177 mg/dL — ABNORMAL HIGH (ref <150)
VLDL: 35 mg/dL (ref 0–40)

## 2024-01-16 LAB — VITAMIN B12: Vitamin B-12: 410 pg/mL (ref 180–914)

## 2024-01-16 LAB — TSH: TSH: 2.767 u[IU]/mL (ref 0.350–4.500)

## 2024-01-16 LAB — FOLATE: Folate: 8.2 ng/mL (ref >5.9)

## 2024-01-16 MED ORDER — DIVALPROEX SODIUM 500 MG PO DR TAB
500.0000 mg | DELAYED_RELEASE_TABLET | Freq: Two times a day (BID) | ORAL | Status: DC
Start: 2024-01-16 — End: 2024-01-17
  Administered 2024-01-16 – 2024-01-17 (×2): 500 mg via ORAL
  Filled 2024-01-16 (×4): qty 1

## 2024-01-16 MED ORDER — RISPERIDONE 1 MG PO TBDP
1.0000 mg | ORAL_TABLET | Freq: Two times a day (BID) | ORAL | Status: DC
  Administered 2024-01-16 – 2024-01-17 (×3): 1 mg via ORAL
  Filled 2024-01-16 (×6): qty 1

## 2024-01-16 MED ORDER — ZOLPIDEM TARTRATE 5 MG PO TABS
10.0000 mg | ORAL_TABLET | Freq: Every day | ORAL | Status: DC
  Filled 2024-01-16: qty 2

## 2024-01-16 NOTE — H&P (Signed)
 Psychiatric Admission Assessment Adult  Patient Identification: Rodney Thomas  MRN:  [id number]  Date of Evaluation:  01/16/24  Chief Complaint:  Paranoid (HCC) [F22]   Principal Diagnosis: Schizoaffective disorder, bipolar type (HCC)  Diagnosis:  Principal Problem:   Schizoaffective disorder, bipolar type (HCC) Active Problems:   Paranoid (HCC)   Chronic insomnia   Obsessive-compulsive behavior    Chief Complaint: "I think what brought me in here the most is the fear of always being watched.  People are always out to get me."   History of Present Illness: Rodney Thomas is a 33 y.o. who  has a past medical history of Chronic insomnia (01/16/2024), Obsessive-compulsive behavior (01/16/2024), Schizoaffective disorder, bipolar type (HCC) (01/16/2024), and Seasonal allergies.  He presented to [facility] voluntarily for Schizoaffective disorder, bipolar type (HCC).  The patient complains of symptomatology including paranoia, delusional thinking, command auditory hallucinations, and obsessive-compulsive behaviors.  He appears to be a fair though possibly unreliable historian.  The patient reports that he started experiencing symptoms of psychosis around the age of 68.  He states that recently things have been worse, and particularly spending 45 days in jail and increased his paranoia to higher levels than baseline.  He states that while he was in jail he was worried that he would be poisoned.  He states that this has been an ongoing problem and that family members will not buy him a birthday cake because they know we will need it and less he can see it prepared.  He states that in jail he thought the officers would bring him and keep him in there for a life sentence.  He states the other made inmates would gaslighting him by saying "they can charged with anything".  He states that every day he was also worried that an inmate would give his information to the officers so they could "keep me there  forever".  Patient reports a longstanding history of auditory hallucinations which are intermittently command in nature.  He describes 1 voice that "just sits there".  He states that it sounds like someone else's voice rather than his thoughts.  He states that the voice says negative things including telling him he should kill himself at times.  He states that his mental health has caused him to lose a relationship of 7 years, he is not currently able to see his child until he gets help.  Patient also describes a longstanding history of obsessive-compulsive behavior.  He reports that he and his twin brother both have counting rituals.  He points to his blanket and his towel and tells me that if 1 is a 5 and the other is a 7 he has to keep counting until he can get them both to be the same number.  He reports depressive symptoms with suicidal ideation related to the paranoia.   Past Psychiatric History: He  has a past medical history of Chronic insomnia (01/16/2024), Obsessive-compulsive behavior (01/16/2024), Schizoaffective disorder, bipolar type (HCC) (01/16/2024), and Seasonal allergies.  The patient tells me that he has presented to the ED numerous times with complaints of anxiety.  He states that he has been to "all the hospitals in [facility]".  He also states that he has been to multiple hospitals in [place] as well as in another city.  He states that he has never seen a psychiatrist or therapist outside of the hospital, and states that he is never taken psychiatric medicine.  He tells me that he has had suicidal ideation, but  is not able to give me a clear history of attempted suicide.  Is the patient at risk to self?  Yes Has the patient been a risk to self in the past 6 months? No Has the patient been a risk to self within the distant past? No Is the patient a risk to others? No Has the patient been a risk to others in the past 6 months? No Has the patient been a risk to others within  the distant past? No  [place] Scale:  Flowsheet Row Ad[facility] (Current) [facility] from 01/14/2024 in [facility] at [facility]  C-SSRS RISK CATEGORY Moderate Risk High Risk          Prior Inpatient Therapy: Denies Prior Outpatient Therapy: Denies  Alcohol Screening:  1. How often do you have a drink containing alcohol?: Never 2. How many drinks containing alcohol do you have on a typical day when you are drinking?: 1 or 2 3. How often do you have six or more drinks on one occasion?: Never AUDIT-C Score: 0 4. How often during the last year have you found that you were not able to stop drinking once you had started?: Never 5. How often during the last year have you failed to do what was normally expected from you because of drinking?: Never 6. How often during the last year have you needed a first drink in the morning to get yourself going after a heavy drinking session?: Never 7. How often during the last year have you had a feeling of guilt of remorse after drinking?: Never 8. How often during the last year have you been unable to remember what happened the night before because you had been drinking?: Never 9. Have you or someone else been injured as a result of your drinking?: No 10. Has a relative or friend or a doctor or another health worker been concerned about your drinking or suggested you cut down?: No Alcohol Use Disorder Identification Test Final Score (AUDIT): 0  Substance Abuse History in the last 12 months: Denies Consequences of Substance Abuse: NA  Previous Psychotropic Medications: Yes Psychological Evaluations: No  Past Medical History:  Past Medical History:  Diagnosis Date   Chronic insomnia 01/16/2024   Obsessive-compulsive behavior 01/16/2024   Schizoaffective disorder, bipolar type (HCC) 01/16/2024   Seasonal allergies      Family Psychiatric & Medical History: He  reports that his mother has depression and that his brother has OCD like behavior, and is a "raging alcoholic". History reviewed. No pertinent family history.   Tobacco Screening:  Social History   Tobacco Use  Smoking Status Never  Smokeless Tobacco Never      Social History:  Social History   Substance and Sexual Activity  Alcohol Use No      Additional Social History: Marital status: Single Does patient have children?: Yes How many children?: 1 How is patient's relationship with their children?: 6yo daughter - was amazing, but lost her in court on 11/30/23.     Allergies:  No Known Allergies   Lab Results:  Results for orders placed or performed during the hospital encounter of 01/14/24 (from the past 48 hours)  Rapid urine drug screen (hospital performed)     Status: None   Collection Time: 01/14/24  3:28 PM  Result Value Ref Range   Opiates NONE DETECTED NONE DETECTED   Cocaine NONE DETECTED NONE DETECTED   Benzodiazepines NONE DETECTED  NONE DETECTED   Amphetamines NONE DETECTED NONE DETECTED   Tetrahydrocannabinol NONE DETECTED NONE DETECTED   Barbiturates NONE DETECTED NONE DETECTED    Comment: (NOTE) DRUG SCREEN FOR MEDICAL PURPOSES ONLY.  IF CONFIRMATION IS NEEDED FOR ANY PURPOSE, NOTIFY LAB WITHIN 5 DAYS.  LOWEST DETECTABLE LIMITS FOR URINE DRUG SCREEN Drug Class                     Cutoff (ng/mL) Amphetamine and metabolites    1000 Barbiturate and metabolites    200 Benzodiazepine                 200 Opiates and metabolites        300 Cocaine and metabolites        300 THC                            50 Performed at [facility], [address] [place], [place] [address]   Comprehensive metabolic panel     Status: Abnormal   Collection Time: 01/14/24  4:22 PM  Result Value Ref Range   Sodium 142 135 - 145 mmol/L   Potassium 3.6 3.5 - 5.1 mmol/L   Chloride 107 98 - 111 mmol/L   CO2 27 22 - 32 mmol/L   Glucose, Bld 100 (H) 70  - 99 mg/dL    Comment: Glucose reference range applies only to samples taken after fasting for at least 8 hours.   BUN 18 6 - 20 mg/dL   Creatinine, Ser 1.30 0.61 - 1.24 mg/dL   Calcium 9.1 8.9 - 86.5 mg/dL   Total Protein 7.2 6.5 - 8.1 g/dL   Albumin 4.2 3.5 - 5.0 g/dL   AST 32 15 - 41 U/L   ALT 36 0 - 44 U/L   Alkaline Phosphatase 53 38 - 126 U/L   Total Bilirubin 0.7 0.0 - 1.2 mg/dL   GFR, Estimated >78 >46 mL/min    Comment: (NOTE) Calculated using the CKD-EPI Creatinine Equation (2021)    Anion gap 8 5 - 15    Comment: Performed at [facility], [address] [place], [place] [address]  Ethanol     Status: None   Collection Time: 01/14/24  4:22 PM  Result Value Ref Range   Alcohol, Ethyl (B) <10 <10 mg/dL    Comment: (NOTE) Lowest detectable limit for serum alcohol is 10 mg/dL.  For medical purposes only. Performed at [facility], [address], [place], [place] [address]   Salicylate level     Status: Abnormal   Collection Time: 01/14/24  4:22 PM  Result Value Ref Range   Salicylate Lvl <7.0 (L) 7.0 - 30.0 mg/dL    Comment: Performed at [facility], [address] [place], [place] [address]  Acetaminophen level     Status: Abnormal   Collection Time: 01/14/24  4:22 PM  Result Value Ref Range   Acetaminophen (Tylenol), Serum <10 (L) 10 - 30 ug/mL    Comment: (NOTE) Therapeutic concentrations vary significantly. A range of 10-30 ug/mL  may be an effective concentration for many patients. However, some  are best treated at concentrations outside of this range. Acetaminophen concentrations >150 ug/mL at 4 hours after ingestion  and >50 ug/mL at 12 hours after ingestion are often associated with  toxic reactions.  Performed at [facility], [address], [place], [place] [address]  cbc     Status: Abnormal   Collection Time: 01/14/24  4:22 PM  Result Value Ref Range   WBC 12.3 (H)  4.0 - 10.5 K/uL   RBC 5.25 4.22 - 5.81 MIL/uL   Hemoglobin 14.6 13.0 - 17.0 g/dL   HCT 81.1 91.4 - 78.2 %   MCV 86.3 80.0 - 100.0 fL   MCH 27.8 26.0 - 34.0 pg   MCHC 32.2 30.0 - 36.0 g/dL   RDW 95.6 21.3 - 08.6 %   Platelets 218 150 - 400 K/uL   nRBC 0.0 0.0 - 0.2 %    Comment: Performed at [facility], [address], [place], [place] [address]     Blood Alcohol level:  Lab Results  Component Value Date   ETH <10 01/14/2024    Metabolic Disorder Labs:  No results found for: "HGBA1C", "MPG" No results found for: "PROLACTIN"  No results found for: "CHOL", "TRIG", "HDL", "VLDL", "LDLCALC"    Current Medications: Current Facility-Administered Medications  Medication Dose Route Frequency Provider Last Rate Last Admin   acetaminophen (TYLENOL) tablet 650 mg  650 mg Oral Q6H PRN Motley-Mangrum, [name], PMHNP       alum & mag hydroxide-simeth (MAALOX/MYLANTA) 200-200-20 MG/5ML suspension 30 mL  30 mL Oral Q4H PRN Motley-Mangrum, [name], PMHNP       haloperidol (HALDOL) tablet 5 mg  5 mg Oral TID PRN Motley-Mangrum, [name], PMHNP       And   diphenhydrAMINE (BENADRYL) capsule 50 mg  50 mg Oral TID PRN Motley-Mangrum, [name], PMHNP       haloperidol lactate (HALDOL) injection 5 mg  5 mg Intramuscular TID PRN Motley-Mangrum, [name], PMHNP       And   diphenhydrAMINE (BENADRYL) injection 50 mg  50 mg Intramuscular TID PRN Motley-Mangrum, [name], PMHNP       And   LORazepam (ATIVAN) injection 2 mg  2 mg Intramuscular TID PRN Motley-Mangrum, [name], PMHNP       haloperidol lactate (HALDOL) injection 10 mg  10 mg Intramuscular TID PRN Motley-Mangrum, [name], PMHNP       And   diphenhydrAMINE (BENADRYL) injection 50 mg  50 mg Intramuscular TID PRN Motley-Mangrum, [name], PMHNP       And   LORazepam (ATIVAN) injection 2 mg  2 mg Intramuscular TID PRN Motley-Mangrum, [name], PMHNP       divalproex (DEPAKOTE) DR tablet 500 mg  500 mg Oral Q12H  [name], MD       hydrOXYzine (ATARAX) tablet 25 mg  25 mg Oral TID PRN Motley-Mangrum, [name], PMHNP   25 mg at 01/15/24 2031   magnesium hydroxide (MILK OF MAGNESIA) suspension 30 mL  30 mL Oral Daily PRN Motley-Mangrum, [name], PMHNP       risperiDONE (RISPERDAL M-TABS) disintegrating tablet 1 mg  1 mg Oral BID [name], MD   1 mg at 01/16/24 1103   traZODone (DESYREL) tablet 50 mg  50 mg Oral QHS PRN Motley-Mangrum, [name], PMHNP   50 mg at 01/15/24 2031   zolpidem (AMBIEN) tablet 10 mg  10 mg Oral QHS [name], MD        PTA Medications: Medications Prior to Admission  Medication Sig Dispense Refill Last Dose/Taking   predniSONE (DELTASONE) 50 MG tablet Take 1 tablet (50 mg total) by mouth daily. (Patient not taking: Reported on 01/14/2024) 5 tablet 0    traMADol (  ULTRAM) 50 MG tablet Take 1 tablet (50 mg total) by mouth every 6 (six) hours as needed. (Patient not taking: Reported on 01/14/2024) 15 tablet 0      Musculoskeletal: Strength & Muscle Tone: within normal limits Gait & Station: normal Patient leans: N/A    Psychiatric Specialty Exam:  Presentation  General Appearance: Bizarre  Eye Contact: Fair  Speech: Pressured  Speech Volume: Increased  Handedness: Right   Mood and Affect  Mood: Anxious; Irritable  Affect: Inappropriate   Thought Process  Thought Processes: Coherent  Descriptions of Associations: Intact  Orientation: Full (Time, Place and Person)  Thought Content: Illogical; Delusions; Paranoid Ideation  History of Schizophrenia/Schizoaffective disorder: Yes  Duration of Psychotic Symptoms: NA Hallucinations: Hallucinations: Auditory Description of Auditory Hallucinations: CAH to kill self  Ideas of Reference: Paranoia  Suicidal Thoughts: Suicidal Thoughts: Yes, Passive  Homicidal Thoughts: Homicidal Thoughts: No   Sensorium  Memory: Immediate Good; Recent Good  Judgment: Poor  Insight:  Poor   Executive Functions  Concentration: Good  Attention Span: Fair  Recall: Good  Fund of Knowledge: Good  Language: Good   Psychomotor Activity  Psychomotor Activity: Psychomotor Activity: Increased; Restlessness   Assets  Assets: Manufacturing systems engineer; Physical Health; Leisure Time   Sleep  Sleep: Sleep: Poor    Physical Exam: General: Sitting comfortably. NAD. HEENT: Normocephalic, atraumatic, MMM, EMOI Lungs: no increased work of breathing noted Heart: no cyanosis Abdomen: Non distended Musculoskeletal: FROM. No obvious deformities Skin: Warm, dry, intact. No rashes noted Neuro: No obvious focal deficits.  Gait and station are normal  Review of Systems  Constitutional: Negative.   HENT: Negative.    Eyes: Negative.   Respiratory: Negative.    Cardiovascular: Negative.   Gastrointestinal: Negative.   Genitourinary: Negative.   Skin: Negative.   Neurological: Negative.   Psychiatric/Behavioral:  Positive for psychosis, depression, insomnia, obsessive-compulsive behaviors.     Blood pressure 115/80, pulse 84, temperature 97.7 F (36.5 C), temperature source Oral, resp. rate 18, height 5\' 10"  (1.778 m), weight 68.9 kg, SpO2 100%. Body mass index is 21.81 kg/m.   Treatment Plan Summary: ASSESSMENT: Jia Mohamed is an 33 y.o. male who  has a past medical history of Chronic insomnia (01/16/2024), Obsessive-compulsive behavior (01/16/2024), Schizoaffective disorder, bipolar type (HCC) (01/16/2024), and Seasonal allergies.  He presented on 01/15/2024  2:10 PM for Schizoaffective disorder, bipolar type (HCC).    Diagnoses / Active Problems: Patient Active Problem List   Diagnosis Date Noted   Schizoaffective disorder, bipolar type (HCC) 01/16/2024   Chronic insomnia 01/16/2024   Obsessive-compulsive behavior 01/16/2024   Paranoid (HCC) 01/15/2024     PLAN: Safety and Monitoring:  -- Voluntary admission to inpatient psychiatric unit for safety,  stabilization and treatment  -- Daily contact with patient to assess and evaluate symptoms and progress in treatment  -- Patient's case to be discussed in multi-disciplinary team meeting  -- Observation Level : q15 minute checks  -- Vital signs:  q12 hours  -- Precautions: suicide, elopement, and assault  2. Psychiatric Diagnoses and Treatment:  Patient Active Problem List   Diagnosis Date Noted   Schizoaffective disorder, bipolar type (HCC) 01/16/2024   Chronic insomnia 01/16/2024   Obsessive-compulsive behavior 01/16/2024   Paranoid (HCC) 01/15/2024     Scheduled Medications:  divalproex  500 mg Oral Q12H   risperiDONE  1 mg Oral BID   zolpidem  10 mg Oral QHS     As Needed Medications: acetaminophen, alum & mag hydroxide-simeth, haloperidol **AND** diphenhydrAMINE,  haloperidol lactate **AND** diphenhydrAMINE **AND** LORazepam, haloperidol lactate **AND** diphenhydrAMINE **AND** LORazepam, hydrOXYzine, magnesium hydroxide, traZODone    3. Medical Issues Being Addressed:    Labs reviewed, unremarkable.  Will check standard intake labs    4. Discharge Planning:   -- Social work and case management to assist with discharge planning and identification of hospital follow-up needs prior to discharge  -- Estimated LOS: 10 to 14 days  -- Discharge Concerns: Need to establish a safety plan; Medication compliance and effectiveness  -- Discharge Goals: Return home with outpatient referrals for mental health follow-up including medication management/psychotherapy  5. Short Term Goals:  Improve ability to identify changes in lifestyle to reduce recurrence of condition, verbalize feelings, disclose and discuss suicidal ideas, demonstrate self-control, identify and develop effective coping behaviors, compliance with prescribed medications, identify triggers associated with substance abuse/mental health issues, participate in unit milieu and in scheduled group therapies   6. Long Term Goals:  Improvement in symptoms so the patient is ready for discharge   --The risks/benefits/side-effects/alternatives to the medications above were discussed in detail with the patient and time was given for questions. The patient provided informed consent.   -- Metabolic profile and EKG monitoring obtained while on an atypical antipsychotic and listed in the EHR    Total Time Spent in Direct Patient Care:  I personally spent 60 minutes on the unit in direct patient care. The direct patient care time included face-to-face time with the patient, reviewing the patient's chart, communicating with other professionals, and coordinating care. Greater than 50% of this time was spent in counseling or coordinating care with the patient regarding goals of hospitalization, psycho-education, and discharge planning needs.   I certify that inpatient services furnished can reasonably be expected to improve the patient's condition.    Criss Alvine, MD 01/16/2024, 11:34 AM      Portions of this note were created using voice recognition software. Minor syntax errors, grammatical content, spelling, or punctuation errors may have occurred unintentionally. Please notify the Thereasa Parkin if the meaning of any statement is unclear.

## 2024-01-16 NOTE — Progress Notes (Signed)
   01/16/24 2040  Charting Type  Charting Type Shift assessment  Safety Check Verification  Has the RN verified the 15 minute safety check completion? Yes  Neurological  Neuro (WDL) WDL  HEENT  HEENT (WDL) WDL  Respiratory  Respiratory (WDL) WDL  Cardiac  Cardiac (WDL) WDL  Vascular  Vascular (WDL) WDL  Integumentary  Integumentary (WDL) WDL  Braden Scale (Ages 8 and up)  Sensory Perceptions 4  Moisture 4  Activity 4  Mobility 4  Nutrition 3  Friction and Shear 3  Braden Scale Score 22  Musculoskeletal  Musculoskeletal (WDL) WDL  Assistive Device None  Gastrointestinal  Gastrointestinal (WDL) X  GI Symptoms Constipation  GU Assessment  Genitourinary (WDL) WDL  Neurological  Level of Consciousness Alert

## 2024-01-16 NOTE — Progress Notes (Signed)
   01/15/24 2215  Psych Admission Type (Psych Patients Only)  Admission Status Voluntary  Psychosocial Assessment  Patient Complaints Anxiety;Decreased concentration  Eye Contact Fair  Facial Expression Flat  Affect Anxious;Preoccupied  Speech Soft  Interaction Cautious;Guarded  Motor Activity Restless  Appearance/Hygiene In scrubs  Behavior Characteristics Cooperative  Mood Anxious;Suspicious;Preoccupied  Aggressive Behavior  Effect No apparent injury  Thought Process  Coherency Circumstantial  Content WDL  Delusions None reported or observed  Perception WDL  Hallucination None reported or observed  Judgment Limited  Confusion WDL  Danger to Self  Current suicidal ideation? Denies  Agreement Not to Harm Self Yes  Description of Agreement Verbal  Danger to Others  Danger to Others None reported or observed
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
## 2024-01-16 NOTE — Progress Notes (Signed)
   01/16/24 1000  Psych Admission Type (Psych Patients Only)  Admission Status Voluntary  Psychosocial Assessment  Patient Complaints Anxiety;Decreased concentration  Eye Contact Fair  Facial Expression Flat  Affect Anxious  Speech Soft  Interaction Cautious;Guarded  Motor Activity Restless  Appearance/Hygiene In scrubs  Behavior Characteristics Cooperative  Mood Preoccupied  Thought Process  Coherency Circumstantial  Content WDL  Delusions None reported or observed  Perception WDL  Hallucination None reported or observed  Judgment Limited  Confusion WDL  Danger to Self  Current suicidal ideation? Denies  Agreement Not to Harm Self Yes  Description of Agreement verbal  Danger to Others  Danger to Others None reported or observed

## 2024-01-16 NOTE — Plan of Care (Signed)
   Problem: Activity: Goal: Interest or engagement in activities will improve Outcome: Progressing   Problem: Coping: Goal: Ability to demonstrate self-control will improve Outcome: Progressing   Problem: Safety: Goal: Periods of time without injury will increase Outcome: Progressing

## 2024-01-16 NOTE — [facility] Suicide Risk Assessment (Signed)
 [facility] Psychiatric Admission Suicide Risk Assessment   Nursing information obtained from:  Patient Demographic factors:  Male, Adolescent or young adult Current Mental Status:  Suicidal ideation indicated by patient, Self-harm thoughts Loss Factors:  Legal issues Historical Factors:  NA Risk Reduction Factors:  Living with another person, especially a relative  Total Time spent with patient: 1 hour Principal Problem: Schizoaffective disorder, bipolar type (HCC) Diagnosis:  Principal Problem:   Schizoaffective disorder, bipolar type (HCC) Active Problems:   Paranoid (HCC)   Chronic insomnia   Obsessive-compulsive behavior  Subjective Data: Rodney Thomas is a 33 y.o. male who has a past medical history of Seasonal allergies. He presented from an outside hospital for psychosis.  Patient complains of paranoia, auditory hallucinations, chronic insomnia, and obsessive-compulsive behaviors.   Continued Clinical Symptoms:  Alcohol Use Disorder Identification Test Final Score (AUDIT): 0 The "Alcohol Use Disorders Identification Test", Guidelines for Use in Primary Care, Second Edition.  World Science writer [facility] [employer]). Score between 0-7:  no or low risk or alcohol related problems. Score between 8-15:  moderate risk of alcohol related problems. Score between 16-19:  high risk of alcohol related problems. Score 20 or above:  warrants further diagnostic evaluation for alcohol dependence and treatment.   CLINICAL FACTORS:   Severe Anxiety and/or Agitation Schizophrenia:   Command hallucinatons Less than 13 years old Paranoid or undifferentiated type More than one psychiatric diagnosis Unstable or Poor Therapeutic Relationship Previous Psychiatric Diagnoses and Treatments   Musculoskeletal: Strength & Muscle Tone: within normal limits Gait & Station: normal Patient leans: N/A  Psychiatric Specialty Exam:  Presentation  General Appearance:  Bizarre  Eye  Contact: Fair  Speech: Pressured  Speech Volume: Increased  Handedness: Right   Mood and Affect  Mood: Anxious; Irritable  Affect: Inappropriate   Thought Process  Thought Processes: Coherent  Descriptions of Associations:Intact  Orientation:Full (Time, Place and Person)  Thought Content:Illogical; Delusions; Paranoid Ideation  History of Schizophrenia/Schizoaffective disorder:Yes  Duration of Psychotic Symptoms:Greater than six months  Hallucinations:Hallucinations: Auditory Description of Auditory Hallucinations: CAH to kill self  Ideas of Reference:Paranoia  Suicidal Thoughts:Suicidal Thoughts: Yes, Passive  Homicidal Thoughts:Homicidal Thoughts: No   Sensorium  Memory: Immediate Good; Recent Good  Judgment: Poor  Insight: Poor   Executive Functions  Concentration: Good  Attention Span: Fair  Recall: Good  Fund of Knowledge: Good  Language: Good   Psychomotor Activity  Psychomotor Activity: Psychomotor Activity: Increased; Restlessness   Assets  Assets: Manufacturing systems engineer; Physical Health; Leisure Time   Sleep  Sleep: Sleep: Poor    Physical Exam: Physical Exam ROS Blood pressure 115/80, pulse 84, temperature 97.7 F (36.5 C), temperature source Oral, resp. rate 18, height 5\' 10"  (1.778 m), weight 68.9 kg, SpO2 100%. Body mass index is 21.81 kg/m.   COGNITIVE FEATURES THAT CONTRIBUTE TO RISK:  Closed-mindedness    SUICIDE RISK:   Moderate:  Frequent suicidal ideation with limited intensity, and duration, some specificity in terms of plans, no associated intent, good self-control, limited dysphoria/symptomatology, some risk factors present, and identifiable protective factors, including available and accessible social support.  PLAN OF CARE: Group therapy, milieu therapy, medication management, social work consult.  I certify that inpatient services furnished can reasonably be expected to improve the  patient's condition.   [name], MD 01/16/2024, 11:26 AM

## 2024-01-17 ENCOUNTER — Encounter (HOSPITAL_COMMUNITY): Payer: Self-pay | Admitting: Psychiatry

## 2024-01-17 DIAGNOSIS — E559 Vitamin D deficiency, unspecified: Secondary | ICD-10-CM

## 2024-01-17 HISTORY — DX: Vitamin D deficiency, unspecified: E55.9

## 2024-01-17 LAB — VITAMIN D 25 HYDROXY (VIT D DEFICIENCY, FRACTURES): Vit D, 25-Hydroxy: 9.67 ng/mL — ABNORMAL LOW (ref 30–100)

## 2024-01-17 LAB — HEMOGLOBIN A1C
Hgb A1c MFr Bld: 5.4 % (ref 4.8–5.6)
Mean Plasma Glucose: 108.28 mg/dL

## 2024-01-17 LAB — RPR: RPR Ser Ql: NONREACTIVE

## 2024-01-17 MED ORDER — RISPERIDONE 2 MG PO TBDP
2.0000 mg | ORAL_TABLET | Freq: Two times a day (BID) | ORAL | Status: DC
  Administered 2024-01-17 – 2024-01-18 (×2): 2 mg via ORAL
  Filled 2024-01-17 (×4): qty 1

## 2024-01-17 MED ORDER — DIVALPROEX SODIUM 500 MG PO DR TAB
500.0000 mg | DELAYED_RELEASE_TABLET | Freq: Three times a day (TID) | ORAL | Status: DC
  Administered 2024-01-17 – 2024-01-18 (×3): 500 mg via ORAL
  Filled 2024-01-17 (×6): qty 1

## 2024-01-17 MED ORDER — TRAZODONE HCL 50 MG PO TABS
50.0000 mg | ORAL_TABLET | Freq: Every day | ORAL | Status: DC
  Administered 2024-01-17 – 2024-01-19 (×3): 50 mg via ORAL
  Filled 2024-01-17 (×9): qty 1

## 2024-01-17 MED ORDER — VITAMIN D (ERGOCALCIFEROL) 1.25 MG (50000 UNIT) PO CAPS
50000.0000 [IU] | ORAL_CAPSULE | ORAL | Status: DC
  Administered 2024-01-17: 50000 [IU] via ORAL
  Filled 2024-01-17 (×3): qty 1

## 2024-01-17 MED ORDER — VITAMIN D3 25 MCG PO TABS
2000.0000 [IU] | ORAL_TABLET | Freq: Two times a day (BID) | ORAL | Status: DC
  Administered 2024-01-18 – 2024-01-23 (×9): 2000 [IU] via ORAL
  Filled 2024-01-17 (×17): qty 2

## 2024-01-17 NOTE — [facility] Group Notes (Signed)
 Adult Psychoeducational Group Note  Date:  01/17/2024 Time:  10:05 AM  Group Topic/Focus:  Goals Group:   The focus of this group is to help patients establish daily goals to achieve during treatment and discuss how the patient can incorporate goal setting into their daily lives to aide in recovery.  Participation Level:  Active  Participation Quality:  Appropriate  Affect:  Appropriate  Cognitive:  Appropriate  Insight: Appropriate  Engagement in Group:  Engaged  Modes of Intervention:  Discussion  Additional Comments:  Pt attended the goals group and remained appropriate and engaged throughout the duration of the group.   Sheran Lawless 01/17/2024, 10:05 AM

## 2024-01-17 NOTE — Plan of Care (Signed)
 Pt presents with anxious expression and preoccupied affect. Reports good sleep and fair appetite, and rates anxiety 7/10 and depression 6/10. Since initial assessment, pt reports lingering thoughts of passive SI. Pt states that this has been greatly lessened since starting his new medication and reports that "this is probably the best I felt in over 10 years." Pt verbally contracts for safety. Denies HI, AVH, and pain, but continues to endorse paranoia and suspiciousness. Interest in activities has improved and pt was observed in the dayroom participating throughout the day. Medication compliant with no adverse reactions. Safety checks maintained at q 15 minutes. Support, encouragement, and reassurance offered to the pt.   Problem: Education: Goal: Emotional status will improve Outcome: Progressing Goal: Mental status will improve Outcome: Progressing Goal: Verbalization of understanding the information provided will improve Outcome: Progressing   Problem: Activity: Goal: Interest or engagement in activities will improve Outcome: Progressing   Problem: Safety: Goal: Periods of time without injury will increase Outcome: Progressing

## 2024-01-17 NOTE — Progress Notes (Signed)
   01/17/24 2100  Psych Admission Type (Psych Patients Only)  Admission Status Voluntary  Psychosocial Assessment  Patient Complaints Anxiety;Worrying  Eye Contact Fair  Facial Expression Anxious  Affect Anxious;Preoccupied  Speech Slow  Interaction Cautious  Motor Activity Slow  Appearance/Hygiene In scrubs  Behavior Characteristics Cooperative  Mood Anxious;Suspicious  Aggressive Behavior  Effect No apparent injury  Thought Process  Coherency Circumstantial  Content Paranoia;Preoccupation  Delusions WDL  Perception WDL  Hallucination None reported or observed  Judgment Impaired  Confusion WDL  Danger to Self  Current suicidal ideation? Denies

## 2024-01-17 NOTE — Plan of Care (Signed)
   Problem: Education: Goal: Emotional status will improve Outcome: Progressing Goal: Mental status will improve Outcome: Progressing   Problem: Activity: Goal: Interest or engagement in activities will improve Outcome: Progressing Goal: Sleeping patterns will improve Outcome: Progressing   Problem: Safety: Goal: Periods of time without injury will increase Outcome: Progressing

## 2024-01-17 NOTE — Group Note (Signed)
 Date:  01/17/2024 Time:  8:50 PM  Group Topic/Focus:  Wrap-Up Group:   The focus of this group is to help patients review their daily goal of treatment and discuss progress on daily workbooks.    Participation Level:  Active  Participation Quality:  Appropriate  Affect:  Appropriate  Cognitive:  Appropriate  Insight: Appropriate  Engagement in Group:  Engaged  Modes of Intervention:  Education and Exploration  Additional Comments:  Patient attended and participated in group tonight.  He reports that he likes that he is intelligent.  Lita Mains [facility] 01/17/2024, 8:50 PM

## 2024-01-17 NOTE — Plan of Care (Signed)
   Problem: Coping: Goal: Ability to verbalize frustrations and anger appropriately will improve Outcome: Progressing Goal: Ability to demonstrate self-control will improve Outcome: Progressing   Problem: Safety: Goal: Periods of time without injury will increase Outcome: Progressing

## 2024-01-17 NOTE — Progress Notes (Signed)
 [facility] MD Progress Note  01/17/2024 12:19 PM Rodney Thomas  MRN:  [id number] Principal Problem: Schizoaffective disorder, bipolar type (HCC) Diagnosis: Principal Problem:   Schizoaffective disorder, bipolar type (HCC) Active Problems:   Paranoid (HCC)   Chronic insomnia   Obsessive-compulsive behavior   Vitamin D deficiency   ID & Admission Information: Rodney Thomas is an 33 y.o. male who  has a past medical history of Chronic insomnia (01/16/2024), Obsessive-compulsive behavior (01/16/2024), Schizoaffective disorder, bipolar type (HCC) (01/16/2024), Seasonal allergies, and Vitamin D deficiency (01/17/2024).  He presented on 01/15/2024  2:10 PM for Schizoaffective disorder, bipolar type (HCC).  He denies ever being treated for this condition despite having active symptoms for years.  Chief Complaint: "I slept again last night!"  Subjective:   Case was discussed in the multidisciplinary team. MAR was reviewed and patient was compliant with medications.   PRN's in last 24 hours: Trazodone  Psychiatric Team made the following recommendations yesterday: Start Risperdal, Depakote, and Ambien  Patient reports that his mood is brighter today because he is hopeful that symptoms will continue to improve.  He continues to endorse auditory hallucinations of 1 voice, but states that it is quieter.  He continues to present as manic with rapid pressured speech, but he is interruptible.  He was due jubilant to have slept for 2 nights in a row as he states it has been years since he got a full nights sleep.  The first night sleep can be attributed to as needed medications for agitation.  Last night the [facility] indicates that he was not given scheduled Ambien but as needed trazodone instead.  He continues to endorse paranoia, which has been an issue for years but he is hopeful that it will get better.  He was eager to aggressively increase medications to try to improve symptoms as fast as possible.  We  discussed increasing Risperdal to 2 mg twice daily today, and continuing Depakote at 500 mg 3 times daily.  Will discontinue Ambien and schedule trazodone as he appeared to sleep okay with it last night.  Past Psychiatric and Medical Medical History:  Past Medical History:  Diagnosis Date   Chronic insomnia 01/16/2024   Obsessive-compulsive behavior 01/16/2024   Schizoaffective disorder, bipolar type (HCC) 01/16/2024   Seasonal allergies    Vitamin D deficiency 01/17/2024    Past Surgical History:  Procedure Laterality Date   APPENDECTOMY     ORIF RADIAL FRACTURE Left 03/17/2014   Procedure: OPEN REDUCTION INTERNAL FIXATION (ORIF) RADIAL FRACTURE;  Surgeon: Eldred Manges, MD;  Location: [facility] OR;  Service: Orthopedics;  Laterality: Left;  Open Reduction Internal Fixation Left Radial Shaft Fracture    Family History(Medical and Psychiatric): See H&P    Social History:  Social History   Substance and Sexual Activity  Alcohol Use No     Social History   Substance and Sexual Activity  Drug Use No    Social History   Socioeconomic History   Marital status: Single    Spouse name: Not on file   Number of children: Not on file   Years of education: Not on file   Highest education level: Not on file  Occupational History   Not on file  Tobacco Use   Smoking status: Never   Smokeless tobacco: Never  Substance and Sexual Activity   Alcohol use: No   Drug use: No   Sexual activity: Not on file  Other Topics Concern   Not on file  Social History Narrative  Not on file   Social Drivers of Health   Financial Resource Strain: Not on file  Food Insecurity: No Food Insecurity (01/15/2024)   Hunger Vital Sign    Worried About Running Out of Food in the Last Year: Never true    Ran Out of Food in the Last Year: Never true  Transportation Needs: No Transportation Needs (01/15/2024)   PRAPARE - Administrator, Civil Service (Medical): No    Lack of Transportation  (Non-Medical): No  Physical Activity: Not on file  Stress: Not on file  Social Connections: Not on file        Current Medications: Current Facility-Administered Medications  Medication Dose Route Frequency Provider Last Rate Last Admin   acetaminophen (TYLENOL) tablet 650 mg  650 mg Oral Q6H PRN Motley-Mangrum, [name], PMHNP       alum & mag hydroxide-simeth (MAALOX/MYLANTA) 200-200-20 MG/5ML suspension 30 mL  30 mL Oral Q4H PRN Motley-Mangrum, [name], PMHNP       haloperidol (HALDOL) tablet 5 mg  5 mg Oral TID PRN Motley-Mangrum, [name], PMHNP       And   diphenhydrAMINE (BENADRYL) capsule 50 mg  50 mg Oral TID PRN Motley-Mangrum, [name], PMHNP       haloperidol lactate (HALDOL) injection 5 mg  5 mg Intramuscular TID PRN Motley-Mangrum, [name], PMHNP       And   diphenhydrAMINE (BENADRYL) injection 50 mg  50 mg Intramuscular TID PRN Motley-Mangrum, [name], PMHNP       And   LORazepam (ATIVAN) injection 2 mg  2 mg Intramuscular TID PRN Motley-Mangrum, [name], PMHNP       haloperidol lactate (HALDOL) injection 10 mg  10 mg Intramuscular TID PRN Motley-Mangrum, [name], PMHNP       And   diphenhydrAMINE (BENADRYL) injection 50 mg  50 mg Intramuscular TID PRN Motley-Mangrum, [name], PMHNP       And   LORazepam (ATIVAN) injection 2 mg  2 mg Intramuscular TID PRN Motley-Mangrum, [name], PMHNP       divalproex (DEPAKOTE) DR tablet 500 mg  500 mg Oral TID [name], MD   500 mg at 01/17/24 1143   hydrOXYzine (ATARAX) tablet 25 mg  25 mg Oral TID PRN Motley-Mangrum, [name], PMHNP   25 mg at 01/16/24 2036   magnesium hydroxide (MILK OF MAGNESIA) suspension 30 mL  30 mL Oral Daily PRN Motley-Mangrum, [name], PMHNP       risperiDONE (RISPERDAL M-TABS) disintegrating tablet 2 mg  2 mg Oral BID [name], MD       traZODone (DESYREL) tablet 50 mg  50 mg Oral QHS PRN Motley-Mangrum, [name], PMHNP   50 mg at 01/16/24 2036   Vitamin D (Ergocalciferol) (DRISDOL)  1.25 MG (50000 UNIT) capsule 50,000 Units  50,000 Units Oral Q7 days [name], MD   50,000 Units at 01/17/24 0906   [START ON 01/18/2024] vitamin D3 (CHOLECALCIFEROL) tablet 2,000 Units  2,000 Units Oral BID [name], MD       zolpidem Remus Loffler) tablet 10 mg  10 mg Oral QHS [name], MD        Lab Results:  Results for orders placed or performed during the hospital encounter of 01/15/24 (from the past 48 hours)  Folate     Status: None   Collection Time: 01/16/24  6:33 PM  Result Value Ref Range   Folate 8.2 >5.9 ng/mL  Comment: Performed at [facility], [address], [place], [place] [address]  Hemoglobin A1c     Status: None   Collection Time: 01/16/24  6:33 PM  Result Value Ref Range   Hgb A1c MFr Bld 5.4 4.8 - 5.6 %    Comment: (NOTE) Pre diabetes:          5.7%-6.4%  Diabetes:              >6.4%  Glycemic control for   <7.0% adults with diabetes    Mean Plasma Glucose 108.28 mg/dL    Comment: Performed at [facility], [address] [place], [place] [address]  Lipid panel     Status: Abnormal   Collection Time: 01/16/24  6:33 PM  Result Value Ref Range   Cholesterol 164 0 - 200 mg/dL   Triglycerides 098 (H) <150 mg/dL   HDL 57 >11 mg/dL   Total CHOL/HDL Ratio 2.9 RATIO   VLDL 35 0 - 40 mg/dL   LDL Cholesterol 72 0 - 99 mg/dL    Comment:        Total Cholesterol/HDL:CHD Risk Coronary Heart Disease Risk Table                     Men   Women  1/2 Average Risk   3.4   3.3  Average Risk       5.0   4.4  2 X Average Risk   9.6   7.1  3 X Average Risk  23.4   11.0        Use the calculated Patient Ratio above and the CHD Risk Table to determine the patient's CHD Risk.        ATP III CLASSIFICATION (LDL):  <100     mg/dL   Optimal  914-782  mg/dL   Near or Above                    Optimal  130-159  mg/dL   Borderline  956-213  mg/dL   High  >086     mg/dL   Very High Performed at [facility],  [address], [place], [place] [address]   RPR     Status: None   Collection Time: 01/16/24  6:33 PM  Result Value Ref Range   RPR Ser Ql NON REACTIVE NON REACTIVE    Comment: Performed at [facility], [address] [place], [place] [address]  TSH     Status: None   Collection Time: 01/16/24  6:33 PM  Result Value Ref Range   TSH 2.767 0.350 - 4.500 uIU/mL    Comment: Performed by a 3rd Generation assay with a functional sensitivity of <=0.01 uIU/mL. Performed at [facility], [address], [place], [place] [address]   Vitamin B12     Status: None   Collection Time: 01/16/24  6:33 PM  Result Value Ref Range   Vitamin B-12 410 180 - 914 pg/mL    Comment: (NOTE) This assay is not validated for testing neonatal or myeloproliferative syndrome specimens for Vitamin B12 levels. Performed at [facility], [address], [place], [place] [address]   VITAMIN D 25 Hydroxy (Vit-D Deficiency, Fractures)     Status: Abnormal   Collection Time: 01/16/24  6:33 PM  Result Value Ref Range   Vit D, 25-Hydroxy 9.67 (L) 30 - 100 ng/mL    Comment: (NOTE) Vitamin  D deficiency has been defined by the Institute of Medicine  and an Endocrine Society practice guideline as a level of serum 25-OH  vitamin D less than 20 ng/mL (1,2). The Endocrine Society went on to  further define vitamin D insufficiency as a level between 21 and 29  ng/mL (2).  1. IOM (Institute of Medicine). 2010. Dietary reference intakes for  calcium and D. [place]: [employer]. 2. Holick MF, Binkley Alb[place], Bischoff-Ferrari HA, et al. Evaluation,  treatment, and prevention of vitamin D deficiency: an Endocrine  Society clinical practice guideline, JCEM. 2011 Jul; 96(7): 1911-30.  Performed at [facility], [address], [place], [place] [address]     Blood Alcohol level:  Lab Results  Component Value Date   ETH <10 01/14/2024    Metabolic  Disorder Labs: Lab Results  Component Value Date   HGBA1C 5.4 01/16/2024   MPG 108.28 01/16/2024   No results found for: "PROLACTIN" Lab Results  Component Value Date   CHOL 164 01/16/2024   TRIG 177 (H) 01/16/2024   HDL 57 01/16/2024   CHOLHDL 2.9 01/16/2024   VLDL 35 01/16/2024   LDLCALC 72 01/16/2024    Physical Findings: AIMS:  , ,  ,  ,    CIWA:    COWS:     Psychiatric Specialty Exam:  Presentation  General Appearance: Disheveled; Bizarre  Eye Contact: Fair  Speech: Pressured  Speech Volume: Increased  Handedness: Right   Mood and Affect  Mood: Euthymic  Affect: Inappropriate   Thought Process  Thought Processes: Coherent  Descriptions of Associations: Intact  Orientation: Full (Time, Place and Person)  Thought Content: Logical  History of Schizophrenia/Schizoaffective disorder: Yes  Duration of Psychotic Symptoms: NA Hallucinations: Hallucinations: Auditory Description of Auditory Hallucinations: CAH to kill self  Ideas of Reference: Percusatory  Suicidal Thoughts: Suicidal Thoughts: No  Homicidal Thoughts: Homicidal Thoughts: No   Sensorium  Memory: Immediate Good; Recent Good  Judgment: Poor  Insight: Fair   Art therapist  Concentration: Good  Attention Span: Good  Recall: Good  Fund of Knowledge: Good  Language: Good   Psychomotor Activity  Psychomotor Activity: Psychomotor Activity: Increased   Assets  Assets: Communication Skills; Social Support; Housing   Sleep  Sleep: Sleep: Good   Musculoskeletal: Strength & Muscle Tone: within normal limits Gait & Station: normal Patient leans: N/A   Physical Exam: General: Sitting comfortably. NAD. HEENT: Normocephalic, atraumatic, MMM, EMOI Lungs: no increased work of breathing noted Heart: no cyanosis Abdomen: Non distended Musculoskeletal: FROM. No obvious deformities Skin: Warm, dry, intact. No rashes noted Neuro: No obvious focal deficits.  Gait  and station are normal  Review of Systems  Constitutional: Negative.   HENT: Negative.    Eyes: Negative.   Respiratory: Negative.    Cardiovascular: Negative.   Gastrointestinal: Negative.   Genitourinary: Negative.   Skin: Negative.   Neurological: Negative.   Psychiatric/Behavioral:  Positive for paranoid delusions, auditory hallucinations.     Blood pressure 117/77, pulse 87, temperature (!) 97.5 F (36.4 C), temperature source Oral, resp. rate 18, height 5\' 10"  (1.778 m), weight 68.9 kg, SpO2 100%. Body mass index is 21.81 kg/m.  ASSESSMENT: Rodney Thomas is an 33 y.o. male who  has a past medical history of Chronic insomnia (01/16/2024), Obsessive-compulsive behavior (01/16/2024), Schizoaffective disorder, bipolar type (HCC) (01/16/2024), Seasonal allergies, and Vitamin D deficiency (01/17/2024).  He presented on 01/15/2024  2:10 PM for Schizoaffective disorder, bipolar type (HCC).  Diagnoses / Active Problems: Patient Active Problem List   Diagnosis Date Noted   Vitamin D deficiency 01/17/2024   Schizoaffective disorder, bipolar type (HCC) 01/16/2024   Chronic insomnia 01/16/2024   Obsessive-compulsive behavior 01/16/2024   Paranoid (HCC) 01/15/2024      PLAN: Safety and Monitoring:  -- Voluntary admission to inpatient psychiatric unit for safety, stabilization and treatment  -- Daily contact with patient to assess and evaluate symptoms and progress in treatment  -- Patient's case to be discussed in multi-disciplinary team meeting  -- Observation Level : q15 minute checks  -- Vital signs:  q12 hours  -- Precautions: suicide, elopement, and assault  2. Psychiatric Diagnoses and Treatment:  Patient Active Problem List   Diagnosis Date Noted   Vitamin D deficiency 01/17/2024   Schizoaffective disorder, bipolar type (HCC) 01/16/2024   Chronic insomnia 01/16/2024   Obsessive-compulsive behavior 01/16/2024   Paranoid (HCC) 01/15/2024     Scheduled Medications:   divalproex  500 mg Oral TID   risperiDONE  2 mg Oral BID   Vitamin D (Ergocalciferol)  50,000 Units Oral Q7 days   [START ON 01/18/2024] cholecalciferol  2,000 Units Oral BID   zolpidem  10 mg Oral QHS    As Needed Medications: acetaminophen, alum & mag hydroxide-simeth, haloperidol **AND** diphenhydrAMINE, haloperidol lactate **AND** diphenhydrAMINE **AND** LORazepam, haloperidol lactate **AND** diphenhydrAMINE **AND** LORazepam, hydrOXYzine, magnesium hydroxide, traZODone    3. Medical Issues Being Addressed:   -- Vitamin D deficiency  Labs reviewed, unremarkable with the exception of: Hypovitaminosis D   4. Discharge Planning:   -- Social work and case management to assist with discharge planning and identification of hospital follow-up needs prior to discharge  -- Estimated LOS: 7 days  -- Discharge Concerns: Need to establish a safety plan; Medication compliance and effectiveness  -- Discharge Goals: Return home with outpatient referrals for mental health follow-up including medication management/psychotherapy  5. Short Term Goals:  Improve ability to identify changes in lifestyle to reduce recurrence of condition, verbalize feelings, disclose and discuss suicidal ideas, demonstrate self-control, identify and develop effective coping behaviors, compliance with prescribed medications, identify triggers associated with substance abuse/mental health issues, participate in unit milieu and in scheduled group therapies   6. Long Term Goals: Improvement in symptoms so the patient is ready for discharge   --The risks/benefits/side-effects/alternatives to the medications above were discussed in detail with the patient and time was given for questions. The patient provided informed consent.   -- Metabolic profile and EKG monitoring obtained while on an atypical antipsychotic and listed in the EHR    Total Time Spent in Direct Patient Care:  I personally spent 35 minutes on the unit in  direct patient care. The direct patient care time included face-to-face time with the patient, reviewing the patient's chart, communicating with other professionals, and coordinating care. Greater than 50% of this time was spent in counseling or coordinating care with the patient regarding goals of hospitalization, psycho-education, and discharge planning needs.      Criss Alvine, MD Psychiatrist  01/17/2024, 12:19 PM   I certify that inpatient services furnished can reasonably be expected to improve the patient's condition.    Portions of this note were created using voice recognition software. Minor syntax errors, grammatical content, spelling, or punctuation errors may have occurred unintentionally. Please notify the Thereasa Parkin if the meaning of any statement is unclear.

## 2024-01-18 ENCOUNTER — Encounter (HOSPITAL_COMMUNITY): Payer: Self-pay

## 2024-01-18 MED ORDER — RISPERIDONE 2 MG PO TBDP
4.0000 mg | ORAL_TABLET | Freq: Every day | ORAL | Status: DC
  Filled 2024-01-18: qty 2

## 2024-01-18 MED ORDER — RISPERIDONE 3 MG PO TABS
6.0000 mg | ORAL_TABLET | Freq: Every day | ORAL | Status: DC
  Administered 2024-01-19 – 2024-01-22 (×4): 6 mg via ORAL
  Filled 2024-01-18 (×6): qty 2

## 2024-01-18 MED ORDER — DIVALPROEX SODIUM ER 500 MG PO TB24
2000.0000 mg | ORAL_TABLET | Freq: Every day | ORAL | Status: DC
  Administered 2024-01-18 – 2024-01-22 (×5): 2000 mg via ORAL
  Filled 2024-01-18 (×8): qty 4

## 2024-01-18 NOTE — [facility] Suicide Risk Assessment (Signed)
 [facility] INPATIENT:  Family/Significant Other Suicide Prevention Education  Suicide Prevention Education:  Contact Attempts: Olander Friedl (mom) [phone], (name of family member/significant other) has been identified by the patient as the family member/significant other with whom the patient will be residing, and identified as the person(s) who will aid the patient in the event of a mental health crisis.    Patient advised CSW to call at 4:30 PM or later, as his mom was at a doctor's appointment.  CSW left a voicemail at 4:30 PM.  CSW called again at 5:25 PM but didn't leave a voicemail this time.   Sarayah Bacchi O Shandi Godfrey 01/18/2024, 5:28 PM

## 2024-01-18 NOTE — Group Note (Unsigned)
 Date:  01/18/2024 Time:  8:39 PM  Group Topic/Focus:  Wrap-Up Group:   The focus of this group is to help patients review their daily goal of treatment and discuss progress on daily workbooks.     Participation Level:  {[facility] PARTICIPATION ZOXWR:60454}  Participation Quality:  {[facility] PARTICIPATION QUALITY:22265}  Affect:  {[facility] AFFECT:22266}  Cognitive:  {[facility] COGNITIVE:22267}  Insight: {[facility] Insight2:20797}  Engagement in Group:  {[facility] ENGAGEMENT IN UJWJX:91478}  Modes of Intervention:  {[facility] MODES OF INTERVENTION:22269}  Additional Comments:  ***  Scot Dock 01/18/2024, 8:39 PM

## 2024-01-18 NOTE — Progress Notes (Signed)
 Patient reports SI but states it has been decreasing since he has been in the hospital and on medications. Patient states SI occurs because he feels like he is being constantly watched by people, though he know its a part of his illness. Patient was noted to be compliant with medications, attending groups and engaged appropriately with staff and peers.   Assess patient for safety, offer medications as prescribed, and engage patient in 1:1 staff talks.   Patient able to contract for safety. Continue to monitor as planned.

## 2024-01-18 NOTE — Group Note (Signed)
 Date:  01/18/2024 Time:  9:16 PM  Group Topic/Focus:  Wrap-Up Group:   The focus of this group is to help patients review their daily goal of treatment and discuss progress on daily workbooks.    Participation Level:  Active  Participation Quality:  Appropriate  Affect:  Appropriate  Cognitive:  Appropriate  Insight: Appropriate  Engagement in Group:  Engaged  Modes of Intervention:  Education and Exploration  Additional Comments:  Patient attended and participated in group tonight.  He reports that his day was better.  He went to the gym for the first time and first time he talk to people in the gym and in the cafeteria.  Lita Mains [facility] 01/18/2024, 9:16 PM

## 2024-01-18 NOTE — Group Note (Signed)
 LCSW Group Therapy Note   Group Date: 01/18/2024 Start Time: 1300 End Time: 1400  Participation:  patient was present and actively participated in the conversation.      Type of Therapy:  Group Therapy  Topic:  "Healing From Within: Understanding Our Past, Building Our Future"  Objective: To help participants understand the impact of early experiences on mental and physical health, with a focus on Adverse Childhood Experiences (ACEs), and to explore ways to build resilience and healing. Group Goals: Understand ACEs and Their Impact: Learn how childhood experiences shape mental and physical health. Build Resilience: Develop strategies for overcoming challenges and creating positive change. Promote Healing: Recognize the value of support and the possibility of healing through therapy and self-care. Summary: In today's session, we discussed how early experiences, especially ACEs, impact mental and physical health. We explored the effects of stress, abuse, and neglect on brain development and well-being. The group focused on resilience, understanding that healing and positive change are possible with support and self-awareness. Therapeutic Modalities Used: Psychoeducation: Sharing information about ACEs and their effects. Cognitive Behavioral Therapy (CBT): Helping reframe negative thought patterns. Trauma-Informed Therapy: Creating a safe, supportive space for healing.   Alla Feeling, LCSWA 01/18/2024  2:41 PM

## 2024-01-18 NOTE — BH IP Treatment Plan (Signed)
 Interdisciplinary Treatment and Diagnostic Plan Update  01/18/2024 Time of Session: 1125 Rodney Thomas MRN: [id number]  Principal Diagnosis: Schizoaffective disorder, bipolar type [facility])  Secondary Diagnoses: Principal Problem:   Schizoaffective disorder, bipolar type (HCC) Active Problems:   Paranoid (HCC)   Chronic insomnia   Obsessive-compulsive behavior   Vitamin D deficiency   Current Medications:  Current Facility-Administered Medications  Medication Dose Route Frequency Provider Last Rate Last Admin   acetaminophen (TYLENOL) tablet 650 mg  650 mg Oral Q6H PRN Motley-Mangrum, [name], PMHNP   650 mg at 01/17/24 2229   alum & mag hydroxide-simeth (MAALOX/MYLANTA) 200-200-20 MG/5ML suspension 30 mL  30 mL Oral Q4H PRN Motley-Mangrum, [name], PMHNP       haloperidol (HALDOL) tablet 5 mg  5 mg Oral TID PRN Motley-Mangrum, [name], PMHNP       And   diphenhydrAMINE (BENADRYL) capsule 50 mg  50 mg Oral TID PRN Motley-Mangrum, [name], PMHNP       haloperidol lactate (HALDOL) injection 5 mg  5 mg Intramuscular TID PRN Motley-Mangrum, [name], PMHNP       And   diphenhydrAMINE (BENADRYL) injection 50 mg  50 mg Intramuscular TID PRN Motley-Mangrum, [name], PMHNP       And   LORazepam (ATIVAN) injection 2 mg  2 mg Intramuscular TID PRN Motley-Mangrum, [name], PMHNP       haloperidol lactate (HALDOL) injection 10 mg  10 mg Intramuscular TID PRN Motley-Mangrum, [name], PMHNP       And   diphenhydrAMINE (BENADRYL) injection 50 mg  50 mg Intramuscular TID PRN Motley-Mangrum, [name], PMHNP       And   LORazepam (ATIVAN) injection 2 mg  2 mg Intramuscular TID PRN Motley-Mangrum, [name], PMHNP       divalproex (DEPAKOTE ER) 24 hr tablet 2,000 mg  2,000 mg Oral QHS [name], Delight Ovens, MD       hydrOXYzine (ATARAX) tablet 25 mg  25 mg Oral TID PRN Motley-Mangrum, [name], PMHNP   25 mg at 01/17/24 2033   magnesium hydroxide (MILK OF MAGNESIA) suspension 30 mL  30 mL Oral  Daily PRN Motley-Mangrum, [name], PMHNP       [START ON 01/19/2024] risperiDONE (RISPERDAL M-TABS) disintegrating tablet 4 mg  4 mg Oral QHS [name], Delight Ovens, MD       [START ON 01/19/2024] risperiDONE (RISPERDAL) tablet 6 mg  6 mg Oral QHS [name], [name], MD       traZODone (DESYREL) tablet 50 mg  50 mg Oral QHS PRN Motley-Mangrum, [name], PMHNP   50 mg at 01/16/24 2036   traZODone (DESYREL) tablet 50 mg  50 mg Oral QHS [name], MD   50 mg at 01/17/24 2039   Vitamin D (Ergocalciferol) (DRISDOL) 1.25 MG (50000 UNIT) capsule 50,000 Units  50,000 Units Oral Q7 days [name], MD   50,000 Units at 01/17/24 6962   vitamin D3 (CHOLECALCIFEROL) tablet 2,000 Units  2,000 Units Oral BID [name], MD   2,000 Units at 01/18/24 9528   PTA Medications: Medications Prior to Admission  Medication Sig Dispense Refill Last Dose/Taking   predniSONE (DELTASONE) 50 MG tablet Take 1 tablet (50 mg total) by mouth daily. (Patient not taking: Reported on 01/14/2024) 5 tablet 0    traMADol (ULTRAM) 50 MG tablet Take 1 tablet (50 mg total) by mouth every 6 (six) hours as needed. (Patient not taking: Reported on 01/14/2024) 15 tablet 0  Patient Stressors: Legal issue   Marital or family conflict    Patient Strengths: Ability for insight  Printmaker for treatment/growth  Physical Health  Supportive family/friends   Treatment Modalities: Medication Management, Group therapy, Case management,  1 to 1 session with clinician, Psychoeducation, Recreational therapy.   Physician Treatment Plan for Primary Diagnosis: Schizoaffective disorder, bipolar type (HCC) Long Term Goal(s):     Short Term Goals:    Medication Management: Evaluate patient's response, side effects, and tolerance of medication regimen.  Therapeutic Interventions: 1 to 1 sessions, Unit Group sessions and Medication administration.  Evaluation of Outcomes: Not Progressing  Physician  Treatment Plan for Secondary Diagnosis: Principal Problem:   Schizoaffective disorder, bipolar type (HCC) Active Problems:   Paranoid (HCC)   Chronic insomnia   Obsessive-compulsive behavior   Vitamin D deficiency  Long Term Goal(s):     Short Term Goals:       Medication Management: Evaluate patient's response, side effects, and tolerance of medication regimen.  Therapeutic Interventions: 1 to 1 sessions, Unit Group sessions and Medication administration.  Evaluation of Outcomes: Not Progressing   RN Treatment Plan for Primary Diagnosis: Schizoaffective disorder, bipolar type (HCC) Long Term Goal(s): Knowledge of disease and therapeutic regimen to maintain health will improve  Short Term Goals: Ability to demonstrate self-control, Ability to disclose and discuss suicidal ideas, Ability to identify and develop effective coping behaviors will improve, and Compliance with prescribed medications will improve  Medication Management: RN will administer medications as ordered by provider, will assess and evaluate patient's response and provide education to patient for prescribed medication. RN will report any adverse and/or side effects to prescribing provider.  Therapeutic Interventions: 1 on 1 counseling sessions, Psychoeducation, Medication administration, Evaluate responses to treatment, Monitor vital signs and CBGs as ordered, Perform/monitor CIWA, COWS, AIMS and Fall Risk screenings as ordered, Perform wound care treatments as ordered.  Evaluation of Outcomes: Not Progressing   LCSW Treatment Plan for Primary Diagnosis: Schizoaffective disorder, bipolar type (HCC) Long Term Goal(s): Safe transition to appropriate next level of care at discharge, Engage patient in therapeutic group addressing interpersonal concerns.  Short Term Goals: Engage patient in aftercare planning with referrals and resources, Increase social support, Increase ability to appropriately verbalize feelings,  Increase emotional regulation, Facilitate acceptance of mental health diagnosis and concerns, and Increase skills for wellness and recovery  Therapeutic Interventions: Assess for all discharge needs, 1 to 1 time with Social worker, Explore available resources and support systems, Assess for adequacy in community support network, Educate family and significant other(s) on suicide prevention, Complete Psychosocial Assessment, Interpersonal group therapy.  Evaluation of Outcomes: Not Progressing   Progress in Treatment: Attending groups: Yes. Participating in groups: Yes. Taking medication as prescribed: Yes. Toleration medication: Yes. Family/Significant other contact made: No, will contact:  CSW to contact Pt's mother Patient understands diagnosis: Yes. Discussing patient identified problems/goals with staff: Yes. Medical problems stabilized or resolved: Yes. Denies suicidal/homicidal ideation: Yes. Issues/concerns per patient self-inventory: Yes. Other: N/a  New problem(s) identified: No, Describe:  n/a  New Short Term/Long Term Goal(s): medication stabilization, elimination of SI thoughts, development of comprehensive mental wellness plan.    Patient Goals: "To remove all fear of things"  Discharge Plan or Barriers: Patient recently admitted. CSW will continue to follow and assess for appropriate referrals and possible discharge planning.    Reason for Continuation of Hospitalization: Delusions  Medication stabilization Other; describe mood stabilization, discharge planning  Estimated Length of Stay:  Last 3  [place] Suicide Severity Risk Score: Flowsheet Row Ad[facility] (Current) [facility] from [facility]14/2024 in [facility]  C-SSRS RISK CATEGORY Moderate Risk High Risk       Last [facility] 2/9 Scores:     No data to display          Scribe for Treatment Team: Jacinta Shoe,  Alexander Mt 01/18/2024 2:32 PM

## 2024-01-18 NOTE — Progress Notes (Signed)
 Recreation Therapy Notes  INPATIENT RECREATION THERAPY ASSESSMENT  Patient Details Name: Rodney Thomas MRN: [id number] DOB: February 24, 1991 Today's Date: 01/18/2024       Information Obtained From: Patient  Able to Participate in Assessment/Interview: Yes  Patient Presentation: Alert  Reason for Admission (Per Patient): Other (Comments) ("fear of being watched")  Patient Stressors: Other (Comment) ("being watched")  Coping Skills:   Isolation, Self-Injury, Music, Talk, Avoidance, Hot Bath/Shower  Leisure Interests (2+):  Community - Other (Comment), Individual - Other (Comment) (Eating; Going to dog park to watch the animals)  Frequency of Recreation/Participation: Other (Comment) (Eat-Daily; Dog parks- Weekly)  Awareness of Community Resources:  Yes  Community Resources:  [place], [place], Research scientist (physical sciences), Public affairs consultant  Current Use: No  If no, Barriers?: Other (Comment) (fear of people)  Expressed Interest in [employer] Information: No  [place]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

## 2024-01-18 NOTE — Progress Notes (Signed)
 [facility] MD Progress Note  01/18/2024 11:43 AM Rodney Thomas  MRN:  [id number] Subjective:   33 year old African-American male, single, unemployed, lives with his mother.  Known history of schizoaffective disorder bipolar type.  Hospitalized on account of worsening psychosis and mania.  Preoccupied with delusional belief.  Reports auditory hallucinations commanding him to end his life.  Recently incarcerated on account of communicating threat.  He has a pending legal charge. Routine labs are essentially normal.  No alcohol or any psychoactive substance on board.  I assumed care of this patient today.  Chart was reviewed.  Patient was discussed at multidisciplinary team meeting.  Nursing staff reports that patient remains fixated on his delusions.  He slept for just about 4 hours last night.  He is eating his meals.  No challenging behavior on the unit.  No PRNs required.  I met with the patient for the first time today.  He reports a strong family history of mental illness.  States that his mother is "super bipolar".  Patient has had symptoms since the age of 33 years of age.  States that his paranoia has affected his ability to maintain a job.  He gets fired most of the time because he woke up to people accusing them of talking about him.  Patient states that he did not take treatment in the past.  This is his fourth psychiatric admission.  States that he used to isolate himself from people as he was scheduled to do something to him.  He had episodes of not eating because he felt they wanted to poison him.  Patient states that he feels better with this regimen.  He has noticed that he is relatively calmer.  He reports drowsiness from his daytime medications.  No stiffness, no drooling, no falls.  No current suicidal thoughts.  No violent thoughts towards others or to property.  No possibility of William.  No possibility of thought.    Principal Problem: Schizoaffective disorder, bipolar type  (HCC) Diagnosis: Principal Problem:   Schizoaffective disorder, bipolar type (HCC) Active Problems:   Paranoid (HCC)   Chronic insomnia   Obsessive-compulsive behavior   Vitamin D deficiency  Total Time spent with patient: 20 minutes  Past Psychiatric History:  See H&P  Past Medical History:  Past Medical History:  Diagnosis Date   Chronic insomnia 01/16/2024   Obsessive-compulsive behavior 01/16/2024   Schizoaffective disorder, bipolar type (HCC) 01/16/2024   Seasonal allergies    Vitamin D deficiency 01/17/2024    Past Surgical History:  Procedure Laterality Date   APPENDECTOMY     ORIF RADIAL FRACTURE Left 03/17/2014   Procedure: OPEN REDUCTION INTERNAL FIXATION (ORIF) RADIAL FRACTURE;  Surgeon: Eldred Manges, MD;  Location: [facility] OR;  Service: Orthopedics;  Laterality: Left;  Open Reduction Internal Fixation Left Radial Shaft Fracture   Family History: History reviewed. No pertinent family history. Family Psychiatric  History:  See H&P  Social History:  Social History   Substance and Sexual Activity  Alcohol Use No     Social History   Substance and Sexual Activity  Drug Use No    Social History   Socioeconomic History   Marital status: Single    Spouse name: Not on file   Number of children: Not on file   Years of education: Not on file   Highest education level: Not on file  Occupational History   Not on file  Tobacco Use   Smoking status: Never   Smokeless tobacco: Never  Substance and Sexual Activity   Alcohol use: No   Drug use: No   Sexual activity: Not on file  Other Topics Concern   Not on file  Social History Narrative   Not on file   Social Drivers of Health   Financial Resource Strain: Not on file  Food Insecurity: No Food Insecurity (01/15/2024)   Hunger Vital Sign    Worried About Running Out of Food in the Last Year: Never true    Ran Out of Food in the Last Year: Never true  Transportation Needs: No Transportation Needs (01/15/2024)    PRAPARE - Administrator, Civil Service (Medical): No    Lack of Transportation (Non-Medical): No  Physical Activity: Not on file  Stress: Not on file  Social Connections: Not on file   Additional Social History:    Current Medications: Current Facility-Administered Medications  Medication Dose Route Frequency Provider Last Rate Last Admin   acetaminophen (TYLENOL) tablet 650 mg  650 mg Oral Q6H PRN Motley-Mangrum, [name], PMHNP   650 mg at 01/17/24 2229   alum & mag hydroxide-simeth (MAALOX/MYLANTA) 200-200-20 MG/5ML suspension 30 mL  30 mL Oral Q4H PRN Motley-Mangrum, [name], PMHNP       haloperidol (HALDOL) tablet 5 mg  5 mg Oral TID PRN Motley-Mangrum, [name], PMHNP       And   diphenhydrAMINE (BENADRYL) capsule 50 mg  50 mg Oral TID PRN Motley-Mangrum, [name], PMHNP       haloperidol lactate (HALDOL) injection 5 mg  5 mg Intramuscular TID PRN Motley-Mangrum, [name], PMHNP       And   diphenhydrAMINE (BENADRYL) injection 50 mg  50 mg Intramuscular TID PRN Motley-Mangrum, [name], PMHNP       And   LORazepam (ATIVAN) injection 2 mg  2 mg Intramuscular TID PRN Motley-Mangrum, [name], PMHNP       haloperidol lactate (HALDOL) injection 10 mg  10 mg Intramuscular TID PRN Motley-Mangrum, [name], PMHNP       And   diphenhydrAMINE (BENADRYL) injection 50 mg  50 mg Intramuscular TID PRN Motley-Mangrum, [name], PMHNP       And   LORazepam (ATIVAN) injection 2 mg  2 mg Intramuscular TID PRN Motley-Mangrum, [name], PMHNP       divalproex (DEPAKOTE) DR tablet 500 mg  500 mg Oral TID [name], MD   500 mg at 01/18/24 0756   hydrOXYzine (ATARAX) tablet 25 mg  25 mg Oral TID PRN Motley-Mangrum, [name], PMHNP   25 mg at 01/17/24 2033   magnesium hydroxide (MILK OF MAGNESIA) suspension 30 mL  30 mL Oral Daily PRN Motley-Mangrum, [name], PMHNP       risperiDONE (RISPERDAL M-TABS) disintegrating tablet 2 mg  2 mg Oral BID [name], MD   2 mg at  01/18/24 0756   traZODone (DESYREL) tablet 50 mg  50 mg Oral QHS PRN Motley-Mangrum, [name], PMHNP   50 mg at 01/16/24 2036   traZODone (DESYREL) tablet 50 mg  50 mg Oral QHS [name], MD   50 mg at 01/17/24 2039   Vitamin D (Ergocalciferol) (DRISDOL) 1.25 MG (50000 UNIT) capsule 50,000 Units  50,000 Units Oral Q7 days [name], MD   50,000 Units at 01/17/24 9811   vitamin D3 (CHOLECALCIFEROL) tablet 2,000 Units  2,000 Units Oral BID [name], MD   2,000 Units at 01/18/24 0756    Lab  Results:  Results for orders placed or performed during the hospital encounter of 01/15/24 (from the past 48 hours)  Folate     Status: None   Collection Time: 01/16/24  6:33 PM  Result Value Ref Range   Folate 8.2 >5.9 ng/mL    Comment: Performed at [facility], [address], [place], [place] [address]  Hemoglobin A1c     Status: None   Collection Time: 01/16/24  6:33 PM  Result Value Ref Range   Hgb A1c MFr Bld 5.4 4.8 - 5.6 %    Comment: (NOTE) Pre diabetes:          5.7%-6.4%  Diabetes:              >6.4%  Glycemic control for   <7.0% adults with diabetes    Mean Plasma Glucose 108.28 mg/dL    Comment: Performed at [facility], [address], [place], [place] [address]  Lipid panel     Status: Abnormal   Collection Time: 01/16/24  6:33 PM  Result Value Ref Range   Cholesterol 164 0 - 200 mg/dL   Triglycerides 098 (H) <150 mg/dL   HDL 57 >11 mg/dL   Total CHOL/HDL Ratio 2.9 RATIO   VLDL 35 0 - 40 mg/dL   LDL Cholesterol 72 0 - 99 mg/dL    Comment:        Total Cholesterol/HDL:CHD Risk Coronary Heart Disease Risk Table                     Men   Women  1/2 Average Risk   3.4   3.3  Average Risk       5.0   4.4  2 X Average Risk   9.6   7.1  3 X Average Risk  23.4   11.0        Use the calculated Patient Ratio above and the CHD Risk Table to determine the patient's CHD Risk.        ATP III CLASSIFICATION (LDL):  <100     mg/dL    Optimal  914-782  mg/dL   Near or Above                    Optimal  130-159  mg/dL   Borderline  956-213  mg/dL   High  >086     mg/dL   Very High Performed at [facility], [address], [place], [place] [address]   RPR     Status: None   Collection Time: 01/16/24  6:33 PM  Result Value Ref Range   RPR Ser Ql NON REACTIVE NON REACTIVE    Comment: Performed at [facility], [address] [place], [place] [address]  TSH     Status: None   Collection Time: 01/16/24  6:33 PM  Result Value Ref Range   TSH 2.767 0.350 - 4.500 uIU/mL    Comment: Performed by a 3rd Generation assay with a functional sensitivity of <=0.01 uIU/mL. Performed at [facility], [address], [place], [place] [address]   Vitamin B12     Status: None   Collection Time: 01/16/24  6:33 PM  Result Value Ref Range   Vitamin B-12 410 180 - 914 pg/mL    Comment: (NOTE) This assay is not validated for testing neonatal or myeloproliferative syndrome specimens for Vitamin B12 levels. Performed at [facility], [address] [place], [place] [address]   VITAMIN D 25 Hydroxy (Vit-D Deficiency, Fractures)     Status: Abnormal   Collection Time: 01/16/24  6:33 PM  Result Value Ref Range   Vit D, 25-Hydroxy 9.67 (L) 30 - 100 ng/mL    Comment: (NOTE) Vitamin D deficiency has been defined by the Institute of Medicine  and an Endocrine Society practice guideline as a level of serum 25-OH  vitamin D less than 20 ng/mL (1,2). The Endocrine Society went on to  further define vitamin D insufficiency as a level between 21 and 29  ng/mL (2).  1. IOM (Institute of Medicine). 2010. Dietary reference intakes for  calcium and D. [place]: [employer]. 2. Holick MF, Binkley Talpa, Bischoff-Ferrari HA, et al. Evaluation,  treatment, and prevention of vitamin D deficiency: an Endocrine  Society clinical practice guideline, JCEM. 2011 Jul;  96(7): 1911-30.  Performed at [facility], [address] [place], [place] [address]     Blood Alcohol level:  Lab Results  Component Value Date   ETH <10 01/14/2024    Metabolic Disorder Labs: Lab Results  Component Value Date   HGBA1C 5.4 01/16/2024   MPG 108.28 01/16/2024   No results found for: "PROLACTIN" Lab Results  Component Value Date   CHOL 164 01/16/2024   TRIG 177 (H) 01/16/2024   HDL 57 01/16/2024   CHOLHDL 2.9 01/16/2024   VLDL 35 01/16/2024   LDLCALC 72 01/16/2024    Physical Findings: AIMS:  , ,  ,  ,    CIWA:    COWS:     Musculoskeletal: Strength & Muscle Tone: within normal limits Gait & Station: normal Patient leans: N/A  Psychiatric Specialty Exam:  Presentation  General Appearance:  Slim build, not in any distress, moderate rapport.  No EPS. Eye Contact: Fair  Speech: Spontaneous, pressured but not loud.  Mood and Affect  Mood: Dysphoric and irritable.    Affect: Mood congruent. Thought Process  Thought Processes: Increased speed of thought.  Goal directed.  Descriptions of Associations:Intact  Orientation:Full (Time, Place and Person)  Thought Content: Persecutory and paranoid delusions.  No current suicidal thoughts.  No homicidal thoughts.  No thoughts of violence.  Hallucinations: Auditory hallucination   Sensorium  Memory: Immediate Good; Recent Good  Judgment: Fair.  Insight: Good.  Executive Functions  Concentration: Good  Attention Span: Good  Recall: Good  Fund of Knowledge: Good  Language: Good   Psychomotor Activity  Slightly increased psychomotor activity  Physical Exam: Physical Exam ROS Blood pressure 116/76, pulse 75, temperature 97.7 F (36.5 C), temperature source Oral, resp. rate 14, height 5\' 10"  (1.778 m), weight 68.9 kg, SpO2 100%. Body mass index is 21.81 kg/m.   Treatment Plan Summary: Patient has a genetic loading for schizoaffective.  He had suffered  from mood symptoms and psychotic symptoms since the age of 30.  His illness has affected his ability to keep a job and get along with people.  He is tolerating combination of Depakote, risperidone well we will make further adjustments as below and evaluate him further.  1.  Increase risperidone to 6 mg at bedtime only. 2.  Switch Depakote to extended release form 2000 mg at Bedtime. 3.  Continue to monitor mood, behavior and interaction with others. 4.  Continue to encourage unit groups and therapeutic activities.    Georgiann Cocker, MD 01/18/2024, 11:43 AM

## 2024-01-18 NOTE — Group Note (Unsigned)
 Date:  01/18/2024 Time:  8:57 PM  Group Topic/Focus:  Wrap-Up Group:   The focus of this group is to help patients review their daily goal of treatment and discuss progress on daily workbooks.     Participation Level:  {BHH PARTICIPATION WUJWJ:19147}  Participation Quality:  {BHH PARTICIPATION QUALITY:22265}  Affect:  {BHH AFFECT:22266}  Cognitive:  {BHH COGNITIVE:22267}  Insight: {BHH Insight2:20797}  Engagement in Group:  {BHH ENGAGEMENT IN WGNFA:21308}  Modes of Intervention:  {BHH MODES OF INTERVENTION:22269}  Additional Comments:  ***  Scot Dock 01/18/2024, 8:57 PM

## 2024-01-18 NOTE — Group Note (Signed)
 Recreation Therapy Group Note   Group Topic:Health and Wellness  Group Date: 01/18/2024 Start Time: 1023 End Time: 1045 Facilitators: Kniyah Khun-McCall, LRT,CTRS Location: [address] Dayroom   Group Topic: Wellness  Goal Area(s) Addresses:  Patient will define components of whole wellness. Patient will verbalize benefit of whole wellness.  Intervention: Music  Activity: Exercise. LRT instructed patients they would take turns leading the group in the exercises of their choosing. Patients were encouraged to give their best but if they needed to take a break or get water to do so. Patients were also encouraged to give their best effort throughout group session.   Education: Wellness, Building control surveyor.   Education Outcome: Acknowledges education/In group clarification offered/Needs additional education.   Affect/Mood: N/A   Participation Level: Did not attend    Clinical Observations/Individualized Feedback:      Plan: Continue to engage patient in RT group sessions 2-3x/week.   Aubriegh Minch-McCall, LRT,CTRS 01/18/2024 1:08 PM

## 2024-01-18 NOTE — Progress Notes (Signed)
   01/18/24 0700  15 Minute Checks  Location Cafeteria  Visual Appearance Calm  Behavior Composed  Sleep (Behavioral Health Patients Only)  Calculate sleep? (Click Yes once per 24 hr at 0600 safety check) Yes  Documented sleep last 24 hours 4.25

## 2024-01-19 ENCOUNTER — Other Ambulatory Visit (HOSPITAL_COMMUNITY): Payer: Self-pay

## 2024-01-19 NOTE — Progress Notes (Signed)
   01/18/24 2200  Psych Admission Type (Psych Patients Only)  Admission Status Voluntary  Psychosocial Assessment  Patient Complaints Anxiety  Eye Contact Fair  Facial Expression Anxious  Affect Anxious  Speech Slow  Interaction Cautious;Forwards little  Motor Activity Slow  Appearance/Hygiene Unremarkable  Behavior Characteristics Cooperative  Mood Pleasant;Anxious;Suspicious  Thought Process  Coherency Circumstantial  Content Paranoia;Preoccupation  Delusions WDL  Perception WDL  Hallucination None reported or observed  Judgment Impaired  Confusion WDL  Danger to Self  Current suicidal ideation? Denies  Danger to Others  Danger to Others None reported or observed   On assessment, patient presented with suspiciousness and mild anxiety.  Patient is observed in the milieu interacting with peers.  Patient reports, "I finally came out talking to people today, I've never done that before."  Administered PRN Hydroxyzine and Trazodone per [facility] per patient request.  Patient is safe on the unit with q15 minute safety checks.

## 2024-01-19 NOTE — Progress Notes (Signed)
   01/19/24 0545  15 Minute Checks  Location Bedroom  Visual Appearance Calm  Behavior Sleeping  Sleep (Behavioral Health Patients Only)  Calculate sleep? (Click Yes once per 24 hr at 0600 safety check) Yes  Documented sleep last 24 hours 7.25

## 2024-01-19 NOTE — Progress Notes (Signed)
 [facility] MD Progress Note  01/19/2024 8:25 PM Rodney Thomas  MRN:  [id number] Subjective:   33 year old African-American male, single, unemployed, lives with his mother.  Known history of schizoaffective disorder bipolar type.  Hospitalized on account of worsening psychosis and mania.  Preoccupied with delusional belief.  Reports auditory hallucinations commanding him to end his life.  Recently incarcerated on account of communicating threat.  He has a pending legal charge. Routine labs are essentially normal.  No alcohol or any psychoactive substance on board.  Chart reviewed today.  Patient discussed at multidisciplinary team meeting.  Nursing staff reports that patient tolerated recent adjustments made.  He has been adherent with his medications.  He slept for 7.25 hours.  He is more relaxed though still paranoid.  He is coming out in the milieu more.  Social worker obtained collateral from her family.  Collateral indicates extensive paranoia.  Seen today.  Patient has tolerated recent adjustments made to his medication.  He tells me that he slept well last night.  States that his symptoms are getting less each day.  States that he will rate most of his symptoms at 2/10.  Suspiciousness and paranoia has not changed much.  He feels that when people talk to make references to him.  He is not experiencing any form of hallucination.  Patient does not have any thoughts of violence towards others or to himself.  He is eating enough food and drinking enough fluids.  No worries about getting poisoned.  Supportive approach today.  Have encouraged him to keep ventilating his feelings to staff.  I have reassured him that the medications were gradually kick in.  Principal Problem: Schizoaffective disorder, bipolar type (HCC) Diagnosis: Principal Problem:   Schizoaffective disorder, bipolar type (HCC) Active Problems:   Paranoid (HCC)   Chronic insomnia   Obsessive-compulsive behavior   Vitamin D  deficiency  Total Time spent with patient: 20 minutes  Past Psychiatric History:  See H&P  Past Medical History:  Past Medical History:  Diagnosis Date   Chronic insomnia 01/16/2024   Obsessive-compulsive behavior 01/16/2024   Schizoaffective disorder, bipolar type (HCC) 01/16/2024   Seasonal allergies    Vitamin D deficiency 01/17/2024    Past Surgical History:  Procedure Laterality Date   APPENDECTOMY     ORIF RADIAL FRACTURE Left 03/17/2014   Procedure: OPEN REDUCTION INTERNAL FIXATION (ORIF) RADIAL FRACTURE;  Surgeon: Eldred Manges, MD;  Location: [facility] OR;  Service: Orthopedics;  Laterality: Left;  Open Reduction Internal Fixation Left Radial Shaft Fracture   Family History: History reviewed. No pertinent family history. Family Psychiatric  History:  See H&P  Social History:  Social History   Substance and Sexual Activity  Alcohol Use No     Social History   Substance and Sexual Activity  Drug Use No    Social History   Socioeconomic History   Marital status: Single    Spouse name: Not on file   Number of children: Not on file   Years of education: Not on file   Highest education level: Not on file  Occupational History   Not on file  Tobacco Use   Smoking status: Never   Smokeless tobacco: Never  Substance and Sexual Activity   Alcohol use: No   Drug use: No   Sexual activity: Not on file  Other Topics Concern   Not on file  Social History Narrative   Not on file   Social Drivers of Health   Financial Resource Strain:  Not on file  Food Insecurity: No Food Insecurity (01/15/2024)   Hunger Vital Sign    Worried About Running Out of Food in the Last Year: Never true    Ran Out of Food in the Last Year: Never true  Transportation Needs: No Transportation Needs (01/15/2024)   PRAPARE - Administrator, Civil Service (Medical): No    Lack of Transportation (Non-Medical): No  Physical Activity: Not on file  Stress: Not on file  Social  Connections: Not on file   Additional Social History:    Current Medications: Current Facility-Administered Medications  Medication Dose Route Frequency Provider Last Rate Last Admin   acetaminophen (TYLENOL) tablet 650 mg  650 mg Oral Q6H PRN Motley-Mangrum, [name], PMHNP   650 mg at 01/17/24 2229   alum & mag hydroxide-simeth (MAALOX/MYLANTA) 200-200-20 MG/5ML suspension 30 mL  30 mL Oral Q4H PRN Motley-Mangrum, [name], PMHNP       haloperidol (HALDOL) tablet 5 mg  5 mg Oral TID PRN Motley-Mangrum, [name], PMHNP       And   diphenhydrAMINE (BENADRYL) capsule 50 mg  50 mg Oral TID PRN Motley-Mangrum, [name], PMHNP       haloperidol lactate (HALDOL) injection 5 mg  5 mg Intramuscular TID PRN Motley-Mangrum, [name], PMHNP       And   diphenhydrAMINE (BENADRYL) injection 50 mg  50 mg Intramuscular TID PRN Motley-Mangrum, [name], PMHNP       And   LORazepam (ATIVAN) injection 2 mg  2 mg Intramuscular TID PRN Motley-Mangrum, [name], PMHNP       haloperidol lactate (HALDOL) injection 10 mg  10 mg Intramuscular TID PRN Motley-Mangrum, [name], PMHNP       And   diphenhydrAMINE (BENADRYL) injection 50 mg  50 mg Intramuscular TID PRN Motley-Mangrum, [name], PMHNP       And   LORazepam (ATIVAN) injection 2 mg  2 mg Intramuscular TID PRN Motley-Mangrum, [name], PMHNP       divalproex (DEPAKOTE ER) 24 hr tablet 2,000 mg  2,000 mg Oral QHS Lura Falor A, MD   2,000 mg at 01/18/24 2041   hydrOXYzine (ATARAX) tablet 25 mg  25 mg Oral TID PRN Motley-Mangrum, [name], PMHNP   25 mg at 01/18/24 2042   magnesium hydroxide (MILK OF MAGNESIA) suspension 30 mL  30 mL Oral Daily PRN Motley-Mangrum, [name], PMHNP       risperiDONE (RISPERDAL M-TABS) disintegrating tablet 4 mg  4 mg Oral QHS Jayliah Benett A, MD       risperiDONE (RISPERDAL) tablet 6 mg  6 mg Oral QHS Vash Quezada A, MD       traZODone (DESYREL) tablet 50 mg  50 mg Oral QHS PRN Motley-Mangrum, [name], PMHNP    50 mg at 01/18/24 2154   traZODone (DESYREL) tablet 50 mg  50 mg Oral QHS [name], MD   50 mg at 01/18/24 2042   Vitamin D (Ergocalciferol) (DRISDOL) 1.25 MG (50000 UNIT) capsule 50,000 Units  50,000 Units Oral Q7 days [name], MD   50,000 Units at 01/17/24 4098   vitamin D3 (CHOLECALCIFEROL) tablet 2,000 Units  2,000 Units Oral BID [name], MD   2,000 Units at 01/19/24 1820    Lab Results:  No results found for this or any previous visit (from the past 48 hours).   Blood Alcohol level:  Lab Results  Component Value Date   ETH <  10 01/14/2024    Metabolic Disorder Labs: Lab Results  Component Value Date   HGBA1C 5.4 01/16/2024   MPG 108.28 01/16/2024   No results found for: "PROLACTIN" Lab Results  Component Value Date   CHOL 164 01/16/2024   TRIG 177 (H) 01/16/2024   HDL 57 01/16/2024   CHOLHDL 2.9 01/16/2024   VLDL 35 01/16/2024   LDLCALC 72 01/16/2024    Physical Findings: AIMS:  , ,  ,  ,    CIWA:    COWS:     Musculoskeletal: Strength & Muscle Tone: within normal limits Gait & Station: normal Patient leans: N/A  Psychiatric Specialty Exam:  Presentation  General Appearance:  Slim build, not in any distress, moderate rapport.  No EPS. Eye Contact: Fair  Speech: Spontaneous, normalizing rate, tone and volume.  Mood and Affect  Mood: Less irritable.  Affect: Mood congruent.  Thought Process  Thought Processes: Increased speed of thought.  Goal directed.  Descriptions of Associations:Intact  Orientation:Full (Time, Place and Person)  Thought Content: Persecutory and paranoid delusions.  No negative ruminations.  No guilty ruminations.  No current suicidal thoughts.  No homicidal thoughts.  No thoughts of violence.  Hallucinations: No hallucination today.  Sensorium  Memory: Immediate Good; Recent Good  Judgment: Fair.  Insight: Good.  Executive Functions  Concentration: Good  Attention  Span: Good  Recall: Good  Fund of Knowledge: Good  Language: Good   Psychomotor Activity  Normalizing psychomotor activity  Physical Exam: Physical Exam ROS Blood pressure 126/83, pulse 95, temperature (!) 97.5 F (36.4 C), temperature source Oral, resp. rate 14, height 5\' 10"  (1.778 m), weight 68.9 kg, SpO2 99%. Body mass index is 21.81 kg/m.   Treatment Plan Summary: Patient has tolerated recent adjustments made.  He is gradually responding appropriately to treatment.  No dangerousness.  We will keep his medicine the same and evaluate him further.  1.  Risperidone 6 mg at bedtime only. 2.  Depakote ER 2000 mg at bedtime. 3.  Continue to monitor mood, behavior and interaction with others. 4.  Continue to encourage unit groups and therapeutic activities. 5.  Social worker will coordinate discharge and aftercare planning.    Georgiann Cocker, MD 01/19/2024, 8:25 PM

## 2024-01-19 NOTE — [facility] Group Notes (Signed)
 Adult Psychoeducational Group Note  Date:  01/19/2024 Time:  4:15 PM  Group Topic/Focus:  Goals Group:   The focus of this group is to help patients establish daily goals to achieve during treatment and discuss how the patient can incorporate goal setting into their daily lives to aide in recovery. Orientation:   The focus of this group is to educate the patient on the purpose and policies of crisis stabilization and provide a format to answer questions about their admission.  The group details unit policies and expectations of patients while admitted.  Participation Level:  Active  Participation Quality:  Appropriate  Affect:  Appropriate  Cognitive:  Appropriate  Insight: Appropriate  Engagement in Group:  Engaged  Modes of Intervention:  Discussion  Additional Comments:  Pt attended the goals group and remained appropriate and engaged throughout the duration of the group.   Sheran Lawless 01/19/2024, 4:15 PM

## 2024-01-19 NOTE — Progress Notes (Signed)
   01/19/24 2105  Psych Admission Type (Psych Patients Only)  Admission Status Voluntary  Psychosocial Assessment  Patient Complaints Anxiety;Irritability  Eye Contact Fair  Facial Expression Anxious  Affect Anxious  Speech Slow  Interaction Cautious;Forwards little  Motor Activity Slow  Appearance/Hygiene Unremarkable  Behavior Characteristics Cooperative  Mood Anxious  Thought Process  Coherency Circumstantial  Content Paranoia;Preoccupation  Delusions WDL  Perception WDL  Hallucination None reported or observed  Judgment Impaired  Confusion WDL  Danger to Self  Current suicidal ideation? Denies  Danger to Others  Danger to Others None reported or observed
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
## 2024-01-19 NOTE — Group Note (Signed)
 Date:  01/19/2024 Time:  8:48 PM  Group Topic/Focus:  Wrap-Up Group:   The focus of this group is to help patients review their daily goal of treatment and discuss progress on daily workbooks.    Participation Level:  Active  Participation Quality:  Appropriate  Affect:  Appropriate  Cognitive:  Appropriate  Insight: Appropriate  Engagement in Group:  Engaged  Modes of Intervention:  Education and Exploration  Additional Comments:  Patient attended and participated in group tonight. He went outside and talk to more people today.  Lita Mains [facility] 01/19/2024, 8:48 PM

## 2024-01-19 NOTE — Plan of Care (Signed)
   Problem: Education: Goal: Mental status will improve Outcome: Progressing   Problem: Activity: Goal: Interest or engagement in activities will improve Outcome: Progressing

## 2024-01-19 NOTE — Group Note (Signed)
 Recreation Therapy Group Note   Group Topic:Animal Assisted Therapy   Group Date: 01/19/2024 Start Time: 0945 End Time: 1030 Facilitators: Chrisanne Loose-McCall, LRT,CTRS Location: [address] Dayroom   Animal-Assisted Activity (AAA) Program Checklist/Progress Notes Patient Eligibility Criteria Checklist & Daily Group note for Rec Tx Intervention  AAA/T Program Assumption of Risk Form signed by Patient/ or Parent Legal Guardian Yes  Patient is free of allergies or severe asthma Yes  Patient reports no fear of animals Yes  Patient reports no history of cruelty to animals Yes  Patient understands his/her participation is voluntary Yes  Patient washes hands before animal contact Yes  Patient washes hands after animal contact Yes  Education: Hand Washing, Appropriate Animal Interaction   Education Outcome: Acknowledges education.    Affect/Mood: Flat   Participation Level: Moderate   Participation Quality: Independent   Behavior: On-looking   Speech/Thought Process: None   Insight: None   Judgement: None   Modes of Intervention: Teaching laboratory technician   Patient Response to Interventions:  Receptive   Education Outcome:  In group clarification offered    Clinical Observations/Individualized Feedback: Pt was quiet and observant. Pt engaged with Dixie when he entered group. Pt had no further interaction with her and observed peers.     Plan: Continue to engage patient in RT group sessions 2-3x/week.   Abbegail Matuska-McCall, LRT,CTRS  01/19/2024 1:55 PM

## 2024-01-19 NOTE — Plan of Care (Signed)
   Problem: Education: Goal: Emotional status will improve Outcome: Progressing Goal: Mental status will improve Outcome: Progressing

## 2024-01-20 ENCOUNTER — Inpatient Hospital Stay (HOSPITAL_COMMUNITY): Payer: Self-pay

## 2024-01-20 ENCOUNTER — Encounter (HOSPITAL_COMMUNITY): Payer: Self-pay | Admitting: Psychiatry

## 2024-01-20 DIAGNOSIS — F250 Schizoaffective disorder, bipolar type: Secondary | ICD-10-CM | POA: Diagnosis not present

## 2024-01-20 LAB — CBC WITH DIFFERENTIAL/PLATELET
Abs Immature Granulocytes: 0.35 10*3/uL — ABNORMAL HIGH (ref 0.00–0.07)
Basophils Absolute: 0 10*3/uL (ref 0.0–0.1)
Basophils Relative: 0 %
Eosinophils Absolute: 0.2 10*3/uL (ref 0.0–0.5)
Eosinophils Relative: 2 %
HCT: 44.4 % (ref 39.0–52.0)
Hemoglobin: 14.4 g/dL (ref 13.0–17.0)
Immature Granulocytes: 2 %
Lymphocytes Relative: 12 %
Lymphs Abs: 1.7 10*3/uL (ref 0.7–4.0)
MCH: 28.1 pg (ref 26.0–34.0)
MCHC: 32.4 g/dL (ref 30.0–36.0)
MCV: 86.5 fL (ref 80.0–100.0)
Monocytes Absolute: 0.9 10*3/uL (ref 0.1–1.0)
Monocytes Relative: 7 %
Neutro Abs: 11.2 10*3/uL — ABNORMAL HIGH (ref 1.7–7.7)
Neutrophils Relative %: 77 %
Platelets: 198 10*3/uL (ref 150–400)
RBC: 5.13 MIL/uL (ref 4.22–5.81)
RDW: 16 % — ABNORMAL HIGH (ref 11.5–15.5)
WBC: 14.5 10*3/uL — ABNORMAL HIGH (ref 4.0–10.5)
nRBC: 0 % (ref 0.0–0.2)

## 2024-01-20 LAB — COMPREHENSIVE METABOLIC PANEL
ALT: 109 U/L — ABNORMAL HIGH (ref 0–44)
AST: 71 U/L — ABNORMAL HIGH (ref 15–41)
Albumin: 4.2 g/dL (ref 3.5–5.0)
Alkaline Phosphatase: 52 U/L (ref 38–126)
Anion gap: 13 (ref 5–15)
BUN: 10 mg/dL (ref 6–20)
CO2: 25 mmol/L (ref 22–32)
Calcium: 9.4 mg/dL (ref 8.9–10.3)
Chloride: 101 mmol/L (ref 98–111)
Creatinine, Ser: 0.83 mg/dL (ref 0.61–1.24)
GFR, Estimated: >60 mL/min (ref >60)
Glucose, Bld: 97 mg/dL (ref 70–99)
Potassium: 3.8 mmol/L (ref 3.5–5.1)
Sodium: 139 mmol/L (ref 135–145)
Total Bilirubin: 0.5 mg/dL (ref 0.0–1.2)
Total Protein: 7.3 g/dL (ref 6.5–8.1)

## 2024-01-20 LAB — URINALYSIS, ROUTINE W REFLEX MICROSCOPIC
Bilirubin Urine: NEGATIVE
Glucose, UA: NEGATIVE mg/dL
Hgb urine dipstick: NEGATIVE
Ketones, ur: NEGATIVE mg/dL
Leukocytes,Ua: NEGATIVE
Nitrite: NEGATIVE
Protein, ur: NEGATIVE mg/dL
Specific Gravity, Urine: >1.046 — ABNORMAL HIGH (ref 1.005–1.030)
pH: 5 (ref 5.0–8.0)

## 2024-01-20 LAB — PROTIME-INR
INR: 0.9 (ref 0.8–1.2)
Prothrombin Time: 12.3 s (ref 11.4–15.2)

## 2024-01-20 LAB — LIPASE, BLOOD: Lipase: 27 U/L (ref 11–51)

## 2024-01-20 MED ORDER — ONDANSETRON HCL 4 MG/2ML IJ SOLN
4.0000 mg | Freq: Once | INTRAMUSCULAR | Status: AC
  Administered 2024-01-20: 4 mg via INTRAVENOUS
  Filled 2024-01-20: qty 2

## 2024-01-20 MED ORDER — MORPHINE SULFATE (PF) 4 MG/ML IV SOLN
4.0000 mg | Freq: Once | INTRAVENOUS | Status: AC
  Administered 2024-01-20: 4 mg via INTRAVENOUS
  Filled 2024-01-20: qty 1

## 2024-01-20 MED ORDER — ONDANSETRON HCL 4 MG PO TABS: 4.0000 mg | ORAL_TABLET | Freq: Three times a day (TID) | ORAL | Status: DC | PRN

## 2024-01-20 MED ORDER — SODIUM CHLORIDE 0.9 % IV BOLUS
1000.0000 mL | Freq: Once | INTRAVENOUS | Status: AC
  Administered 2024-01-20: 1000 mL via INTRAVENOUS

## 2024-01-20 MED ORDER — KETOROLAC TROMETHAMINE 15 MG/ML IJ SOLN
15.0000 mg | Freq: Once | INTRAMUSCULAR | Status: AC
  Administered 2024-01-20: 15 mg via INTRAVENOUS
  Filled 2024-01-20: qty 1

## 2024-01-20 MED ORDER — AMOXICILLIN-POT CLAVULANATE 875-125 MG PO TABS
1.0000 | ORAL_TABLET | Freq: Once | ORAL | Status: AC
  Administered 2024-01-20: 1 via ORAL
  Filled 2024-01-20: qty 1

## 2024-01-20 MED ORDER — AMOXICILLIN-POT CLAVULANATE 875-125 MG PO TABS
1.0000 | ORAL_TABLET | Freq: Two times a day (BID) | ORAL | Status: DC
  Administered 2024-01-20 – 2024-01-23 (×6): 1 via ORAL
  Filled 2024-01-20 (×9): qty 1

## 2024-01-20 MED ORDER — HYOSCYAMINE SULFATE 0.125 MG SL SUBL
0.2500 mg | SUBLINGUAL_TABLET | Freq: Four times a day (QID) | SUBLINGUAL | Status: DC | PRN
  Administered 2024-01-20 – 2024-01-21 (×2): 0.25 mg via SUBLINGUAL
  Filled 2024-01-20 (×2): qty 2

## 2024-01-20 MED ORDER — AMOXICILLIN-POT CLAVULANATE 875-125 MG PO TABS: 1.0000 | ORAL_TABLET | Freq: Two times a day (BID) | ORAL | 0 refills | Status: DC

## 2024-01-20 MED ORDER — HYOSCYAMINE SULFATE 0.125 MG SL SUBL: 0.1250 mg | SUBLINGUAL_TABLET | SUBLINGUAL | 0 refills | Status: DC | PRN

## 2024-01-20 MED ORDER — ONDANSETRON 4 MG PO TBDP: 4.0000 mg | ORAL_TABLET | Freq: Three times a day (TID) | ORAL | 0 refills | Status: DC | PRN

## 2024-01-20 MED ORDER — HYOSCYAMINE SULFATE 0.125 MG/ML PO SOLN: 0.2500 mg | Freq: Four times a day (QID) | ORAL | Status: DC | PRN

## 2024-01-20 MED ORDER — IOHEXOL 300 MG/ML  SOLN
100.0000 mL | Freq: Once | INTRAMUSCULAR | Status: AC | PRN
  Administered 2024-01-20: 100 mL via INTRAVENOUS

## 2024-01-20 MED ORDER — ALUM & MAG HYDROXIDE-SIMETH 200-200-20 MG/5ML PO SUSP
30.0000 mL | Freq: Once | ORAL | Status: AC
  Administered 2024-01-20: 30 mL via ORAL
  Filled 2024-01-20: qty 30

## 2024-01-20 MED ORDER — DROPERIDOL 2.5 MG/ML IJ SOLN
2.5000 mg | Freq: Once | INTRAMUSCULAR | Status: AC
  Administered 2024-01-20: 2.5 mg via INTRAVENOUS
  Filled 2024-01-20: qty 2

## 2024-01-20 NOTE — ED Notes (Signed)
 Patient has been in and out of the restroom and I have been unable to get the EKG on patient.

## 2024-01-20 NOTE — Progress Notes (Signed)
   01/20/24 2230  Psych Admission Type (Psych Patients Only)  Admission Status Voluntary  Psychosocial Assessment  Patient Complaints Anxiety;Irritability  Eye Contact Fair  Facial Expression Anxious  Affect Anxious  Speech Slow  Interaction Guarded;Cautious  Motor Activity Slow  Appearance/Hygiene Unremarkable  Behavior Characteristics Guarded;Cooperative  Mood Anxious  Thought Process  Coherency Circumstantial  Content Preoccupation  Delusions None reported or observed  Perception WDL  Hallucination None reported or observed  Judgment Impaired  Confusion None  Danger to Self  Current suicidal ideation? Denies  Danger to Others  Danger to Others None reported or observed
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
## 2024-01-20 NOTE — Progress Notes (Signed)
   01/20/24 0549  15 Minute Checks  Location Bedroom  Visual Appearance Calm  Behavior Sleeping  Sleep (Behavioral Health Patients Only)  Calculate sleep? (Click Yes once per 24 hr at 0600 safety check) Yes  Documented sleep last 24 hours 6.25

## 2024-01-20 NOTE — Discharge Instructions (Signed)
 Your CT scan shows colitis.  Please take the medications as prescribed and follow-up with the GI doctor at the number provided.  Please return to the ER for worsening symptoms.

## 2024-01-20 NOTE — Group Note (Signed)
 Date:  01/20/2024 Time:  8:23 PM  Group Topic/Focus:  Wrap-Up Group:   The focus of this group is to help patients review their daily goal of treatment and discuss progress on daily workbooks.    Additional Comments:  Pt was encouraged, but opted out of attending wrap up group this evening.   Chrisandra Netters 01/20/2024, 8:23 PM

## 2024-01-20 NOTE — Progress Notes (Signed)
 During this shift, patient was observed exhibiting moderate to high anxiety related to being unable to visit or contact his daughter for a year.  Patient's anxiety later resulted in patient presenting with a complaint of being awakened due to his heart beating fast.  Patient reports he has panic attacks.  Patient's current HR is 81.  Administered PRN x 2 Hydroxyzine per MAR per patient request.  Administered PRN Trazodone per [facility] per patient request.

## 2024-01-20 NOTE — Progress Notes (Signed)
 [facility] MD Progress Note  01/20/2024 8:22 PM Rodney Thomas  MRN:  [id number] Subjective:   33 year old African-American male, single, unemployed, lives with his mother.  Known history of schizoaffective disorder bipolar type.  Hospitalized on account of worsening psychosis and mania.  Preoccupied with delusional belief.  Reports auditory hallucinations commanding him to end his life.  Recently incarcerated on account of communicating threat.  He has a pending legal charge. Routine labs are essentially normal.  No alcohol or any psychoactive substance on board.  Chart reviewed today.  Patient discussed at multidisciplinary team meeting.  Nursing staff reports that he has been complaining of pain.  He did not sleep too well last night.  He has been adherent with his medicine.  Seen today.  Patient tells me that he still in a lot of pain.  He describes the pain as stabbing around his suprapubic area.  He is moving his bowels but did not look to see the consistency of his bowels.  He is passing flatus.  He is not experiencing any abdominal distention.  No dysuria or urinary frequency.  No associated vomiting.  No associated fever.  He is not endorsing any mental health concerns at this time.  He is more focused on his pain.  We have agreed to send him to the emergency room.  Report from the emergency room reviewed.  He was diagnosed with colitis and started on antibiotics.  He was also given pain medication.  He seems to be better with treatment.   Principal Problem: Schizoaffective disorder, bipolar type (HCC) Diagnosis: Principal Problem:   Schizoaffective disorder, bipolar type (HCC) Active Problems:   Paranoid (HCC)   Chronic insomnia   Obsessive-compulsive behavior   Vitamin D deficiency  Total Time spent with patient: 20 minutes  Past Psychiatric History:  See H&P  Past Medical History:  Past Medical History:  Diagnosis Date   Chronic insomnia 01/16/2024   Obsessive-compulsive  behavior 01/16/2024   Schizoaffective disorder, bipolar type (HCC) 01/16/2024   Seasonal allergies    Vitamin D deficiency 01/17/2024    Past Surgical History:  Procedure Laterality Date   APPENDECTOMY     ORIF RADIAL FRACTURE Left 03/17/2014   Procedure: OPEN REDUCTION INTERNAL FIXATION (ORIF) RADIAL FRACTURE;  Surgeon: Eldred Manges, MD;  Location: [facility] OR;  Service: Orthopedics;  Laterality: Left;  Open Reduction Internal Fixation Left Radial Shaft Fracture   Family History: History reviewed. No pertinent family history. Family Psychiatric  History:  See H&P  Social History:  Social History   Substance and Sexual Activity  Alcohol Use No     Social History   Substance and Sexual Activity  Drug Use No    Social History   Socioeconomic History   Marital status: Single    Spouse name: Not on file   Number of children: Not on file   Years of education: Not on file   Highest education level: Not on file  Occupational History   Not on file  Tobacco Use   Smoking status: Never   Smokeless tobacco: Never  Substance and Sexual Activity   Alcohol use: No   Drug use: No   Sexual activity: Not on file  Other Topics Concern   Not on file  Social History Narrative   Not on file   Social Drivers of Health   Financial Resource Strain: Not on file  Food Insecurity: No Food Insecurity (01/15/2024)   Hunger Vital Sign    Worried About Running Out of  Food in the Last Year: Never true    Ran Out of Food in the Last Year: Never true  Transportation Needs: No Transportation Needs (01/15/2024)   PRAPARE - Administrator, Civil Service (Medical): No    Lack of Transportation (Non-Medical): No  Physical Activity: Not on file  Stress: Not on file  Social Connections: Not on file   Additional Social History:    Current Medications: Current Facility-Administered Medications  Medication Dose Route Frequency Provider Last Rate Last Admin   acetaminophen (TYLENOL) tablet  650 mg  650 mg Oral Q6H PRN Motley-Mangrum, [name], PMHNP   650 mg at 01/17/24 2229   alum & mag hydroxide-simeth (MAALOX/MYLANTA) 200-200-20 MG/5ML suspension 30 mL  30 mL Oral Q4H PRN Motley-Mangrum, [name], PMHNP   30 mL at 01/20/24 0643   amoxicillin-clavulanate (AUGMENTIN) 875-125 MG per tablet 1 tablet  1 tablet Oral Q12H Sindy Guadeloupe, NP       haloperidol (HALDOL) tablet 5 mg  5 mg Oral TID PRN Motley-Mangrum, [name], PMHNP       And   diphenhydrAMINE (BENADRYL) capsule 50 mg  50 mg Oral TID PRN Motley-Mangrum, [name], PMHNP       haloperidol lactate (HALDOL) injection 5 mg  5 mg Intramuscular TID PRN Motley-Mangrum, [name], PMHNP       And   diphenhydrAMINE (BENADRYL) injection 50 mg  50 mg Intramuscular TID PRN Motley-Mangrum, [name], PMHNP       And   LORazepam (ATIVAN) injection 2 mg  2 mg Intramuscular TID PRN Motley-Mangrum, [name], PMHNP       haloperidol lactate (HALDOL) injection 10 mg  10 mg Intramuscular TID PRN Motley-Mangrum, [name], PMHNP       And   diphenhydrAMINE (BENADRYL) injection 50 mg  50 mg Intramuscular TID PRN Motley-Mangrum, [name], PMHNP       And   LORazepam (ATIVAN) injection 2 mg  2 mg Intramuscular TID PRN Motley-Mangrum, [name], PMHNP       divalproex (DEPAKOTE ER) 24 hr tablet 2,000 mg  2,000 mg Oral QHS Kyndel Egger A, MD   2,000 mg at 01/19/24 2101   hydrOXYzine (ATARAX) tablet 25 mg  25 mg Oral TID PRN Motley-Mangrum, [name], PMHNP   25 mg at 01/20/24 0027   hyoscyamine (LEVSIN) 0.125 MG/ML solution 0.25 mg  0.25 mg Oral Q6H PRN Sindy Guadeloupe, NP       magnesium hydroxide (MILK OF MAGNESIA) suspension 30 mL  30 mL Oral Daily PRN Motley-Mangrum, [name], PMHNP       ondansetron (ZOFRAN) tablet 4 mg  4 mg Oral Q8H PRN Sindy Guadeloupe, NP       risperiDONE (RISPERDAL) tablet 6 mg  6 mg Oral QHS Omaya Nieland A, MD   6 mg at 01/19/24 2103   traZODone (DESYREL) tablet 50 mg  50 mg Oral QHS PRN Motley-Mangrum, [name], PMHNP    50 mg at 01/19/24 2157   traZODone (DESYREL) tablet 50 mg  50 mg Oral QHS [name], MD   50 mg at 01/19/24 2103   Vitamin D (Ergocalciferol) (DRISDOL) 1.25 MG (50000 UNIT) capsule 50,000 Units  50,000 Units Oral Q7 days [name], MD   50,000 Units at 01/17/24 2202   vitamin D3 (CHOLECALCIFEROL) tablet 2,000 Units  2,000 Units Oral BID [name], MD   2,000 Units at 01/20/24 5427    Lab Results:  Results for orders placed  or performed during the hospital encounter of 01/15/24 (from the past 48 hours)  CBC with Differential     Status: Abnormal   Collection Time: 01/20/24  1:23 PM  Result Value Ref Range   WBC 14.5 (H) 4.0 - 10.5 K/uL   RBC 5.13 4.22 - 5.81 MIL/uL   Hemoglobin 14.4 13.0 - 17.0 g/dL   HCT 16.1 09.6 - 04.5 %   MCV 86.5 80.0 - 100.0 fL   MCH 28.1 26.0 - 34.0 pg   MCHC 32.4 30.0 - 36.0 g/dL   RDW 40.9 (H) 81.1 - 91.4 %   Platelets 198 150 - 400 K/uL   nRBC 0.0 0.0 - 0.2 %   Neutrophils Relative % 77 %   Neutro Abs 11.2 (H) 1.7 - 7.7 K/uL   Lymphocytes Relative 12 %   Lymphs Abs 1.7 0.7 - 4.0 K/uL   Monocytes Relative 7 %   Monocytes Absolute 0.9 0.1 - 1.0 K/uL   Eosinophils Relative 2 %   Eosinophils Absolute 0.2 0.0 - 0.5 K/uL   Basophils Relative 0 %   Basophils Absolute 0.0 0.0 - 0.1 K/uL   Immature Granulocytes 2 %   Abs Immature Granulocytes 0.35 (H) 0.00 - 0.07 K/uL    Comment: Performed at [facility], [address] [place], [place] [address]  Comprehensive metabolic panel     Status: Abnormal   Collection Time: 01/20/24  1:23 PM  Result Value Ref Range   Sodium 139 135 - 145 mmol/L   Potassium 3.8 3.5 - 5.1 mmol/L   Chloride 101 98 - 111 mmol/L   CO2 25 22 - 32 mmol/L   Glucose, Bld 97 70 - 99 mg/dL    Comment: Glucose reference range applies only to samples taken after fasting for at least 8 hours.   BUN 10 6 - 20 mg/dL   Creatinine, Ser 6.21 0.61 - 1.24 mg/dL   Calcium 9.4 8.9 - 30.8 mg/dL   Total Protein 7.3  6.5 - 8.1 g/dL   Albumin 4.2 3.5 - 5.0 g/dL   AST 71 (H) 15 - 41 U/L   ALT 109 (H) 0 - 44 U/L   Alkaline Phosphatase 52 38 - 126 U/L   Total Bilirubin 0.5 0.0 - 1.2 mg/dL   GFR, Estimated >65 >78 mL/min    Comment: (NOTE) Calculated using the CKD-EPI Creatinine Equation (2021)    Anion gap 13 5 - 15    Comment: Performed at [facility], [address] [place], [place] [address]  Lipase, blood     Status: None   Collection Time: 01/20/24  1:23 PM  Result Value Ref Range   Lipase 27 11 - 51 U/L    Comment: Performed at [facility], [address], [place], [place] [address]  Protime-INR     Status: None   Collection Time: 01/20/24  1:57 PM  Result Value Ref Range   Prothrombin Time 12.3 11.4 - 15.2 seconds   INR 0.9 0.8 - 1.2    Comment: (NOTE) INR goal varies based on device and disease states. Performed at [facility], [address], [place], [place] [address]   Urinalysis, Routine w reflex microscopic -Urine, Clean Catch     Status: Abnormal   Collection Time: 01/20/24  4:35 PM  Result Value Ref Range   Color, Urine STRAW (A) YELLOW   APPearance CLEAR CLEAR   Specific Gravity, Urine >1.046 (H) 1.005 - 1.030  pH 5.0 5.0 - 8.0   Glucose, UA NEGATIVE NEGATIVE mg/dL   Hgb urine dipstick NEGATIVE NEGATIVE   Bilirubin Urine NEGATIVE NEGATIVE   Ketones, ur NEGATIVE NEGATIVE mg/dL   Protein, ur NEGATIVE NEGATIVE mg/dL   Nitrite NEGATIVE NEGATIVE   Leukocytes,Ua NEGATIVE NEGATIVE    Comment: Performed at [facility], [address], [place], [place] [address]     Blood Alcohol level:  Lab Results  Component Value Date   ETH <10 01/14/2024    Metabolic Disorder Labs: Lab Results  Component Value Date   HGBA1C 5.4 01/16/2024   MPG 108.28 01/16/2024   No results found for: "PROLACTIN" Lab Results  Component Value Date   CHOL 164 01/16/2024   TRIG 177 (H) 01/16/2024   HDL 57 01/16/2024    CHOLHDL 2.9 01/16/2024   VLDL 35 01/16/2024   LDLCALC 72 01/16/2024    Physical Findings: AIMS:  , ,  ,  ,    CIWA:    COWS:     Musculoskeletal: Strength & Muscle Tone: within normal limits Gait & Station: normal Patient leans: N/A  Psychiatric Specialty Exam:  Presentation  General Appearance:  Slim build, in mild distress, moderate rapport.  No EPS.  Eye Contact: Good.  Speech: Spontaneous, not pressured or loud.  Mood and Affect  Mood: Mildly dysphoric.  Affect: Restricted and mood congruent.  Thought Process  Thought Processes: Normalizing speed of thought.  Goal directed.  Descriptions of Associations:Intact  Orientation:Full (Time, Place and Person)  Thought Content: Preoccupied with physical pain.  No current suicidal thoughts.  No homicidal thoughts.  No thoughts of violence.  Hallucinations: No hallucination today.  Sensorium  Memory: Immediate Good; Recent Good  Judgment: Fair.  Insight: Good.  Executive Functions  Concentration: Good  Attention Span: Good  Recall: Good  Fund of Knowledge: Good  Language: Good   Psychomotor Activity  Normalizing psychomotor activity  Physical Exam: Physical Exam ROS Blood pressure 136/79, pulse 92, temperature 97.8 F (36.6 C), temperature source Oral, resp. rate 16, height 5\' 10"  (1.778 m), weight 72.6 kg, SpO2 100%. Body mass index is 22.96 kg/m.   Treatment Plan Summary: Patient is tolerating his psychotropic medications well.  He was diagnosed with colitis and did with antibiotics and analgesics.  He has better.  We will keep his psychotropic medication the same and evaluate him further.  1.  Risperidone 6 mg at bedtime only. 2.  Depakote ER 2000 mg at bedtime. 3.  Continue to monitor mood, behavior and interaction with others. 4.  Continue to encourage unit groups and therapeutic activities. 5.  Social worker will coordinate discharge and aftercare planning.    Georgiann Cocker, MD 01/20/2024, 8:22 PM

## 2024-01-20 NOTE — Progress Notes (Signed)
 Note: Patient reports severe abdominal pain this morning.  Mylanta was given at [phone] with poor effect.  Patient continues to report same complaint.  Vital signs: 98.3, 20,101, 125/80, 100%RA.  MD made aware.  Patient sent out to [facility] per MD.  Report called to ED charge nurse.

## 2024-01-20 NOTE — Plan of Care (Signed)
   Problem: Education: Goal: Emotional status will improve Outcome: Progressing Goal: Mental status will improve Outcome: Progressing Goal: Verbalization of understanding the information provided will improve Outcome: Progressing

## 2024-01-20 NOTE — ED Triage Notes (Signed)
 Pt co of stomach pain middle of stomach near belly button says it started 2 days ago. Says he was spitting up blood last night. Threw up for 2 days and had bad nausea. Diarrhea. No fever. No headache.

## 2024-01-20 NOTE — ED Notes (Signed)
 Pt NPO until MD says otherwise

## 2024-01-20 NOTE — ED Provider Notes (Signed)
 [place] EMERGENCY DEPARTMENT AT [facility] Provider Note   CSN: [id number] Arrival date & time: 01/20/24  1239     History  Chief Complaint  Patient presents with   Abdominal Pain    Rodney Thomas is a 33 y.o. male.  33 year old male with past medical history of schizoaffective disorder and OCD presents emergency department today with abdominal pain.  Patient states he has been having abdominal pain now past 48 hours.  States has had multiple episodes of vomiting.  He reports that last few episodes have had some blood in the vomit.  The patient states that this is mostly when he "clears his nose".  He denies any history of abdominal surgeries.  He states that the abdominal pain is diffuse.  He reports that he has been having normal bowel movements.  Denies any blood in stool or dark stools.  He denies taking any blood thinner medications.   Abdominal Pain Associated symptoms: nausea and vomiting        Home Medications Prior to Admission medications   Medication Sig Start Date End Date Taking? Authorizing Provider  amoxicillin-clavulanate (AUGMENTIN) 875-125 MG tablet Take 1 tablet by mouth every 12 (twelve) hours. 01/20/24  Yes Durwin Glaze, MD  hyoscyamine (LEVSIN/SL) 0.125 MG SL tablet Place 1 tablet (0.125 mg total) under the tongue every 4 (four) hours as needed. 01/20/24  Yes Durwin Glaze, MD  ondansetron (ZOFRAN-ODT) 4 MG disintegrating tablet Take 1 tablet (4 mg total) by mouth every 8 (eight) hours as needed for nausea or vomiting. 01/20/24  Yes Durwin Glaze, MD  predniSONE (DELTASONE) 50 MG tablet Take 1 tablet (50 mg total) by mouth daily. Patient not taking: Reported on 01/14/2024 12/26/15   Charlestine Night, PA-C  traMADol (ULTRAM) 50 MG tablet Take 1 tablet (50 mg total) by mouth every 6 (six) hours as needed. Patient not taking: Reported on 01/14/2024 12/26/15   Charlestine Night, PA-C      Allergies    Patient has no known allergies.    Review  of Systems   Review of Systems  Gastrointestinal:  Positive for abdominal pain, nausea and vomiting.  All other systems reviewed and are negative.   Physical Exam Updated Vital Signs BP (!) 141/95 (BP Location: Left Arm)   Pulse 81   Temp 97.8 F (36.6 C) (Oral)   Resp 18   Ht 5\' 10"  (1.778 m)   Wt 72.6 kg   SpO2 100%   BMI 22.96 kg/m  Physical Exam Vitals and nursing note reviewed.   Gen: Appears uncomfortable, walking around the room during assessment Eyes: PERRL, EOMI HEENT: no oropharyngeal swelling Neck: trachea midline Resp: clear to auscultation bilaterally Card: RRR, no murmurs, rubs, or gallops Abd: Diffusely tender with maximal tenderness over the periumbilical region, no guarding or rebound Extremities: no calf tenderness, no edema Vascular: 2+ radial pulses bilaterally, 2+ DP pulses bilaterally Skin: no rashes Psyc: acting appropriately   ED Results / Procedures / Treatments   Labs (all labs ordered are listed, but only abnormal results are displayed) Labs Reviewed  LIPID PANEL - Abnormal; Notable for the following components:      Result Value   Triglycerides 177 (*)    All other components within normal limits  VITAMIN D 25 HYDROXY (VIT D DEFICIENCY, FRACTURES) - Abnormal; Notable for the following components:   Vit D, 25-Hydroxy 9.67 (*)    All other components within normal limits  CBC WITH DIFFERENTIAL/PLATELET - Abnormal; Notable for  the following components:   WBC 14.5 (*)    RDW 16.0 (*)    Neutro Abs 11.2 (*)    Abs Immature Granulocytes 0.35 (*)    All other components within normal limits  COMPREHENSIVE METABOLIC PANEL - Abnormal; Notable for the following components:   AST 71 (*)    ALT 109 (*)    All other components within normal limits  URINALYSIS, ROUTINE W REFLEX MICROSCOPIC - Abnormal; Notable for the following components:   Color, Urine STRAW (*)    Specific Gravity, Urine >1.046 (*)    All other components within normal limits   FOLATE  HEMOGLOBIN A1C  RPR  TSH  VITAMIN B12  LIPASE, BLOOD  PROTIME-INR    EKG EKG Interpretation Date/Time:  Wednesday January 20 2024 14:32:00 EST Ventricular Rate:  89 PR Interval:  115 QRS Duration:  86 QT Interval:  351 QTC Calculation: 427 R Axis:   77  Text Interpretation: Sinus rhythm Borderline short PR interval LVH by voltage ST elev, probable normal early repol pattern Confirmed by Beckey Downing [phone]) on 01/20/2024 2:38:03 PM  Radiology CT ABDOMEN PELVIS W CONTRAST Result Date: 01/20/2024 CLINICAL DATA:  Periumbilical abdominal pain with nausea, vomiting, diarrhea for the past 2 days. EXAM: CT ABDOMEN AND PELVIS WITH CONTRAST TECHNIQUE: Multidetector CT imaging of the abdomen and pelvis was performed using the standard protocol following bolus administration of intravenous contrast. RADIATION DOSE REDUCTION: This exam was performed according to the departmental dose-optimization program which includes automated exposure control, adjustment of the mA and/or kV according to patient size and/or use of iterative reconstruction technique. CONTRAST:  OMNIPAQUE IOHEXOL 300 MG/ML  SOLN COMPARISON:  None Available. FINDINGS: Lower chest: No acute abnormality. Hepatobiliary: No focal liver abnormality is seen. No gallstones, gallbladder wall thickening, or biliary dilatation. Pancreas: Unremarkable. No pancreatic ductal dilatation or surrounding inflammatory changes. Spleen: Normal in size without focal abnormality. Adrenals/Urinary Tract: The adrenal glands are unremarkable. Punctate calculus in the midpole of the right kidney. No renal mass or hydronephrosis. The bladder is decompressed. Stomach/Bowel: The stomach is within normal limits. There are a few borderline dilated loops of small bowel in the left abdomen with fecalization of small bowel contents. No transition point. Fluid filled colon with mild wall thickening of descending and sigmoid colon. Prior appendectomy.  Vascular/Lymphatic: No significant vascular findings are present. No enlarged abdominal or pelvic lymph nodes. Reproductive: Prostate is unremarkable. Other: Trace free fluid in the pelvis, likely reactive. No pneumoperitoneum. Musculoskeletal: No acute or significant osseous findings. IMPRESSION: 1. Fluid filled colon with mild wall thickening of descending and sigmoid colon, consistent with infectious or inflammatory colitis. 2. A few borderline dilated loops of small bowel in the left abdomen with fecalization of small bowel contents, suggestive of focal ileus/slow transit. No transition point to suggest obstruction. 3. Nonobstructive right nephrolithiasis. Electronically Signed   By: Obie Dredge M.D.   On: 01/20/2024 16:13    Procedures Procedures    Medications Ordered in ED Medications  acetaminophen (TYLENOL) tablet 650 mg (650 mg Oral Given 01/17/24 2229)  alum & mag hydroxide-simeth (MAALOX/MYLANTA) 200-200-20 MG/5ML suspension 30 mL (30 mLs Oral Given 01/20/24 0643)  magnesium hydroxide (MILK OF MAGNESIA) suspension 30 mL (has no administration in time range)  haloperidol (HALDOL) tablet 5 mg (has no administration in time range)    And  diphenhydrAMINE (BENADRYL) capsule 50 mg (has no administration in time range)  haloperidol lactate (HALDOL) injection 5 mg (has no administration in time range)  And  diphenhydrAMINE (BENADRYL) injection 50 mg (has no administration in time range)    And  LORazepam (ATIVAN) injection 2 mg (has no administration in time range)  haloperidol lactate (HALDOL) injection 10 mg (has no administration in time range)    And  diphenhydrAMINE (BENADRYL) injection 50 mg (has no administration in time range)    And  LORazepam (ATIVAN) injection 2 mg (has no administration in time range)  hydrOXYzine (ATARAX) tablet 25 mg (25 mg Oral Given 01/20/24 0027)  traZODone (DESYREL) tablet 50 mg (50 mg Oral Given 01/19/24 2157)  Vitamin D (Ergocalciferol) (DRISDOL)  1.25 MG (50000 UNIT) capsule 50,000 Units (50,000 Units Oral Given 01/17/24 0906)  cholecalciferol (VITAMIN D3) 25 MCG (1000 UNIT) tablet 2,000 Units (2,000 Units Oral Not Given 01/20/24 1601)  traZODone (DESYREL) tablet 50 mg (50 mg Oral Given 01/19/24 2103)  divalproex (DEPAKOTE ER) 24 hr tablet 2,000 mg (2,000 mg Oral Given 01/19/24 2101)  risperiDONE (RISPERDAL) tablet 6 mg (6 mg Oral Given 01/19/24 2103)  morphine (PF) 4 MG/ML injection 4 mg (4 mg Intravenous Given 01/20/24 1322)  ondansetron (ZOFRAN) injection 4 mg (4 mg Intravenous Given 01/20/24 1322)  alum & mag hydroxide-simeth (MAALOX/MYLANTA) 200-200-20 MG/5ML suspension 30 mL (30 mLs Oral Given 01/20/24 1315)  iohexol (OMNIPAQUE) 300 MG/ML solution 100 mL (100 mLs Intravenous Contrast Given 01/20/24 1452)  droperidol (INAPSINE) 2.5 MG/ML injection 2.5 mg (2.5 mg Intravenous Given 01/20/24 1447)  sodium chloride 0.9 % bolus 1,000 mL (0 mLs Intravenous Stopped 01/20/24 1620)  ketorolac (TORADOL) 15 MG/ML injection 15 mg (15 mg Intravenous Given 01/20/24 1618)  amoxicillin-clavulanate (AUGMENTIN) 875-125 MG per tablet 1 tablet (1 tablet Oral Given 01/20/24 1635)    ED Course/ Medical Decision Making/ A&P                                 Medical Decision Making 33 year old male with past medical history of obesity and schizoaffective disorder presenting to emergency department today with abdominal pain and vomiting.  The patient reports that he has noticed a small amount of blood intermittently but states this mostly when he "clears his nose.  I do not appreciate any active epistaxis here.  72 some congestion and irritation.  I will further evaluate him here with basic labs to evaluate for anemia.  Will obtain a CT scan of his abdomen to evaluate for appendicitis, diverticulitis, colitis, or other intra-abdominal pathology.  I will give patient morphine Zofran for symptoms.  Will obtain EKG in the event that he requires further antiemetics.  Will  reevaluate for ultimate disposition.  He is mildly tachycardic here but otherwise vital signs are stable with reassuring blood pressure on arrival.  The patient CT scan shows colitis.  He is tolerating p.o. on reevaluation.  There was concern for possible ileus but the patient does report some loose stools this is not clinically applicable.  He is given Augmentin.  He is tolerating fluids.  His vital signs are stable.  I think he is stable for discharge.  Amount and/or Complexity of Data Reviewed Labs: ordered. Radiology: ordered.  Risk OTC drugs. Prescription drug management.           Final Clinical Impression(s) / ED Diagnoses Final diagnoses:  Colitis    Rx / DC Orders ED Discharge Orders          Ordered    amoxicillin-clavulanate (AUGMENTIN) 875-125 MG tablet  Every 12 hours  01/20/24 1647    ondansetron (ZOFRAN-ODT) 4 MG disintegrating tablet  Every 8 hours PRN        01/20/24 1647    hyoscyamine (LEVSIN/SL) 0.125 MG SL tablet  Every 4 hours PRN        01/20/24 1647              Durwin Glaze, MD 01/20/24 [phone]

## 2024-01-21 NOTE — [facility] Group Notes (Signed)
 Adult Psychoeducational Group Note  Date:  01/21/2024 Time:  8:34 PM  Group Topic/Focus:  Wrap-Up Group:   The focus of this group is to help patients review their daily goal of treatment and discuss progress on daily workbooks.  Participation Level:  Active  Participation Quality:  Appropriate  Affect:  Appropriate  Cognitive:  Appropriate  Insight: Appropriate  Engagement in Group:  Engaged  Modes of Intervention:  Discussion  Additional Comments:  Pt attended group.  Joselyn Arrow 01/21/2024, 8:34 PM

## 2024-01-21 NOTE — Progress Notes (Signed)
   01/21/24 1000  Psych Admission Type (Psych Patients Only)  Admission Status Voluntary  Psychosocial Assessment  Patient Complaints Anxiety  Eye Contact Fair  Facial Expression Anxious  Affect Anxious  Speech Slow  Interaction Cautious  Motor Activity Slow  Appearance/Hygiene Unremarkable  Behavior Characteristics Cooperative  Mood Anxious  Thought Process  Coherency Circumstantial  Content Preoccupation  Delusions None reported or observed  Perception WDL  Hallucination None reported or observed  Judgment Impaired  Confusion None  Danger to Self  Current suicidal ideation? Denies  Danger to Others  Danger to Others None reported or observed

## 2024-01-21 NOTE — Progress Notes (Signed)
 [facility] MD Progress Note  01/21/2024 11:06 AM Rodney Thomas  MRN:  [id number] Subjective:   33 year old African-American male, single, unemployed, lives with his mother.  Known history of schizoaffective disorder bipolar type.  Hospitalized on account of worsening psychosis and mania.  Preoccupied with delusional belief.  Reports auditory hallucinations commanding him to end his life.  Recently incarcerated on account of communicating threat.  He has a pending legal charge. Routine labs are essentially normal.  No alcohol or any psychoactive substance on board.  Chart reviewed today.  Patient discussed at multidisciplinary team meeting.  Nursing staff reports that patient slept for 10 hours last night.  He was slightly gregarious when he came back from the emergency room.  No challenging behavior.  No PRNs required.  He continues to endorse ideas of reference.  No observed response to internal stimuli.  Seen today.  Patient is pleased with the medical treatment that he got.  States that he is no longer having abdominal pain.  He feels a lot better mentally.  States that all the symptoms that he came to the hospital with have mostly resolved.  Feelings that people talk about him or make reference to him as the only symptom that is still there.  No feelings that people are able to read his mind.  No feelings of being controlled by an external force.  No suicidal thoughts.  No homicidal thoughts.  No thoughts of violence.  No side effects from his medications. Patient states that he has been communicating with his mother.  I have encouraged him to ask his mother to come visit him here. Encouraged to keep ventilating his feelings to staff.   Principal Problem: Schizoaffective disorder, bipolar type (HCC) Diagnosis: Principal Problem:   Schizoaffective disorder, bipolar type (HCC) Active Problems:   Paranoid (HCC)   Chronic insomnia   Obsessive-compulsive behavior   Vitamin D deficiency  Total  Time spent with patient: 20 minutes  Past Psychiatric History:  See H&P  Past Medical History:  Past Medical History:  Diagnosis Date   Chronic insomnia 01/16/2024   Obsessive-compulsive behavior 01/16/2024   Schizoaffective disorder, bipolar type (HCC) 01/16/2024   Seasonal allergies    Vitamin D deficiency 01/17/2024    Past Surgical History:  Procedure Laterality Date   APPENDECTOMY     ORIF RADIAL FRACTURE Left 03/17/2014   Procedure: OPEN REDUCTION INTERNAL FIXATION (ORIF) RADIAL FRACTURE;  Surgeon: Eldred Manges, MD;  Location: [facility] OR;  Service: Orthopedics;  Laterality: Left;  Open Reduction Internal Fixation Left Radial Shaft Fracture   Family History: History reviewed. No pertinent family history. Family Psychiatric  History:  See H&P  Social History:  Social History   Substance and Sexual Activity  Alcohol Use No     Social History   Substance and Sexual Activity  Drug Use No    Social History   Socioeconomic History   Marital status: Single    Spouse name: Not on file   Number of children: Not on file   Years of education: Not on file   Highest education level: Not on file  Occupational History   Not on file  Tobacco Use   Smoking status: Never   Smokeless tobacco: Never  Substance and Sexual Activity   Alcohol use: No   Drug use: No   Sexual activity: Not on file  Other Topics Concern   Not on file  Social History Narrative   Not on file   Social Drivers of Health  Financial Resource Strain: Not on file  Food Insecurity: No Food Insecurity (01/15/2024)   Hunger Vital Sign    Worried About Running Out of Food in the Last Year: Never true    Ran Out of Food in the Last Year: Never true  Transportation Needs: No Transportation Needs (01/15/2024)   PRAPARE - Administrator, Civil Service (Medical): No    Lack of Transportation (Non-Medical): No  Physical Activity: Not on file  Stress: Not on file  Social Connections: Not on file    Additional Social History:    Current Medications: Current Facility-Administered Medications  Medication Dose Route Frequency Provider Last Rate Last Admin   acetaminophen (TYLENOL) tablet 650 mg  650 mg Oral Q6H PRN Motley-Mangrum, [name], PMHNP   650 mg at 01/17/24 2229   alum & mag hydroxide-simeth (MAALOX/MYLANTA) 200-200-20 MG/5ML suspension 30 mL  30 mL Oral Q4H PRN Motley-Mangrum, [name], PMHNP   30 mL at 01/20/24 0643   amoxicillin-clavulanate (AUGMENTIN) 875-125 MG per tablet 1 tablet  1 tablet Oral Q12H Sindy Guadeloupe, NP   1 tablet at 01/21/24 4098   haloperidol (HALDOL) tablet 5 mg  5 mg Oral TID PRN Motley-Mangrum, [name], PMHNP       And   diphenhydrAMINE (BENADRYL) capsule 50 mg  50 mg Oral TID PRN Motley-Mangrum, [name], PMHNP       haloperidol lactate (HALDOL) injection 5 mg  5 mg Intramuscular TID PRN Motley-Mangrum, [name], PMHNP       And   diphenhydrAMINE (BENADRYL) injection 50 mg  50 mg Intramuscular TID PRN Motley-Mangrum, [name], PMHNP       And   LORazepam (ATIVAN) injection 2 mg  2 mg Intramuscular TID PRN Motley-Mangrum, [name], PMHNP       haloperidol lactate (HALDOL) injection 10 mg  10 mg Intramuscular TID PRN Motley-Mangrum, [name], PMHNP       And   diphenhydrAMINE (BENADRYL) injection 50 mg  50 mg Intramuscular TID PRN Motley-Mangrum, [name], PMHNP       And   LORazepam (ATIVAN) injection 2 mg  2 mg Intramuscular TID PRN Motley-Mangrum, [name], PMHNP       divalproex (DEPAKOTE ER) 24 hr tablet 2,000 mg  2,000 mg Oral QHS Mekhai Venuto A, MD   2,000 mg at 01/20/24 2112   hydrOXYzine (ATARAX) tablet 25 mg  25 mg Oral TID PRN Motley-Mangrum, [name], PMHNP   25 mg at 01/20/24 0027   hyoscyamine (LEVSIN SL) SL tablet 0.25 mg  0.25 mg Sublingual Q6H PRN Sindy Guadeloupe, NP   0.25 mg at 01/20/24 2114   magnesium hydroxide (MILK OF MAGNESIA) suspension 30 mL  30 mL Oral Daily PRN Motley-Mangrum, [name], PMHNP       ondansetron (ZOFRAN)  tablet 4 mg  4 mg Oral Q8H PRN Sindy Guadeloupe, NP       risperiDONE (RISPERDAL) tablet 6 mg  6 mg Oral QHS Oleva Koo A, MD   6 mg at 01/20/24 2113   traZODone (DESYREL) tablet 50 mg  50 mg Oral QHS PRN Motley-Mangrum, [name], PMHNP   50 mg at 01/19/24 2157   traZODone (DESYREL) tablet 50 mg  50 mg Oral QHS [name], MD   50 mg at 01/19/24 2103   Vitamin D (Ergocalciferol) (DRISDOL) 1.25 MG (50000 UNIT) capsule 50,000 Units  50,000 Units Oral Q7 days [name], MD   50,000 Units at 01/17/24 [phone]  vitamin D3 (CHOLECALCIFEROL) tablet 2,000 Units  2,000 Units Oral BID [name], MD   2,000 Units at 01/21/24 [phone]    Lab Results:  Results for orders placed or performed during the hospital encounter of 01/15/24 (from the past 48 hours)  CBC with Differential     Status: Abnormal   Collection Time: 01/20/24  1:23 PM  Result Value Ref Range   WBC 14.5 (H) 4.0 - 10.5 K/uL   RBC 5.13 4.22 - 5.81 MIL/uL   Hemoglobin 14.4 13.0 - 17.0 g/dL   HCT 65.7 84.6 - 96.2 %   MCV 86.5 80.0 - 100.0 fL   MCH 28.1 26.0 - 34.0 pg   MCHC 32.4 30.0 - 36.0 g/dL   RDW 95.2 (H) 84.1 - 32.4 %   Platelets 198 150 - 400 K/uL   nRBC 0.0 0.0 - 0.2 %   Neutrophils Relative % 77 %   Neutro Abs 11.2 (H) 1.7 - 7.7 K/uL   Lymphocytes Relative 12 %   Lymphs Abs 1.7 0.7 - 4.0 K/uL   Monocytes Relative 7 %   Monocytes Absolute 0.9 0.1 - 1.0 K/uL   Eosinophils Relative 2 %   Eosinophils Absolute 0.2 0.0 - 0.5 K/uL   Basophils Relative 0 %   Basophils Absolute 0.0 0.0 - 0.1 K/uL   Immature Granulocytes 2 %   Abs Immature Granulocytes 0.35 (H) 0.00 - 0.07 K/uL    Comment: Performed at [facility], [address], [place], [place] [address]  Comprehensive metabolic panel     Status: Abnormal   Collection Time: 01/20/24  1:23 PM  Result Value Ref Range   Sodium 139 135 - 145 mmol/L   Potassium 3.8 3.5 - 5.1 mmol/L   Chloride 101 98 - 111 mmol/L   CO2 25 22 - 32 mmol/L    Glucose, Bld 97 70 - 99 mg/dL    Comment: Glucose reference range applies only to samples taken after fasting for at least 8 hours.   BUN 10 6 - 20 mg/dL   Creatinine, Ser 7.25 0.61 - 1.24 mg/dL   Calcium 9.4 8.9 - 36.6 mg/dL   Total Protein 7.3 6.5 - 8.1 g/dL   Albumin 4.2 3.5 - 5.0 g/dL   AST 71 (H) 15 - 41 U/L   ALT 109 (H) 0 - 44 U/L   Alkaline Phosphatase 52 38 - 126 U/L   Total Bilirubin 0.5 0.0 - 1.2 mg/dL   GFR, Estimated >44 >03 mL/min    Comment: (NOTE) Calculated using the CKD-EPI Creatinine Equation (2021)    Anion gap 13 5 - 15    Comment: Performed at [facility], [address], [place], [place] [address]  Lipase, blood     Status: None   Collection Time: 01/20/24  1:23 PM  Result Value Ref Range   Lipase 27 11 - 51 U/L    Comment: Performed at [facility], [address], [place], [place] [address]  Protime-INR     Status: None   Collection Time: 01/20/24  1:57 PM  Result Value Ref Range   Prothrombin Time 12.3 11.4 - 15.2 seconds   INR 0.9 0.8 - 1.2    Comment: (NOTE) INR goal varies based on device and disease states. Performed at [facility], [address] [place], [place] [address]   Urinalysis, Routine w reflex microscopic -Urine, Clean Catch     Status: Abnormal   Collection Time: 01/20/24  4:35 PM  Result Value Ref Range   Color, Urine STRAW (A) YELLOW   APPearance CLEAR CLEAR   Specific Gravity, Urine >1.046 (H) 1.005 - 1.030   pH 5.0 5.0 - 8.0   Glucose, UA NEGATIVE NEGATIVE mg/dL   Hgb urine dipstick NEGATIVE NEGATIVE   Bilirubin Urine NEGATIVE NEGATIVE   Ketones, ur NEGATIVE NEGATIVE mg/dL   Protein, ur NEGATIVE NEGATIVE mg/dL   Nitrite NEGATIVE NEGATIVE   Leukocytes,Ua NEGATIVE NEGATIVE    Comment: Performed at [facility], [address] [place], [place] [address]     Blood Alcohol level:  Lab Results  Component Value Date   ETH <10 01/14/2024     Metabolic Disorder Labs: Lab Results  Component Value Date   HGBA1C 5.4 01/16/2024   MPG 108.28 01/16/2024   No results found for: "PROLACTIN" Lab Results  Component Value Date   CHOL 164 01/16/2024   TRIG 177 (H) 01/16/2024   HDL 57 01/16/2024   CHOLHDL 2.9 01/16/2024   VLDL 35 01/16/2024   LDLCALC 72 01/16/2024    Physical Findings: AIMS:  , ,  ,  ,    CIWA:    COWS:     Musculoskeletal: Strength & Muscle Tone: within normal limits Gait & Station: normal Patient leans: N/A  Psychiatric Specialty Exam:  Presentation  General Appearance:  Slim build, not in any distress, in group prior to this encounter, appropriate behavior.  No EPS.  Eye Contact: Good.  Speech: Spontaneous, normal rate, tone and volume.  Mood and Affect  Mood: Substance abuse and objectively better.  Affect: Restricted and mood congruent.  Thought Process  Thought Processes: Normalizing speed of thought.  Goal directed.  Descriptions of Associations:Intact  Orientation:Full (Time, Place and Person)  Thought Content: Ideas of reference.  No delusional preoccupation.  No negative rumination.  No suicidal thoughts.  No homicidal thoughts.  No thoughts of violence.  No obsessions.  Hallucinations: No hallucination in any modality.  Sensorium  Memory: Fair.  Judgment: Better.  Insight: Good.  Executive Functions  Concentration: Good  Attention Span: Good  Recall: Good  Fund of Knowledge: Good  Language: Good   Psychomotor Activity  Normalizing psychomotor activity  Physical Exam: Physical Exam ROS Blood pressure 100/65, pulse (!) 101, temperature 98 F (36.7 C), temperature source Oral, resp. rate 16, height 5\' 10"  (1.778 m), weight 72.6 kg, SpO2 99%. Body mass index is 22.96 kg/m.   Treatment Plan Summary: Patient slept well last night.  He is better today.  Ideas of reference as residual target symptom.  There is no dangerousness.  We will keep  his medicine the same, his mother will visit and give [place] feedback.  We will continue to evaluate and hopefully get him home by weekend.  1.  Risperidone 6 mg at bedtime only. 2.  Depakote ER 2000 mg at bedtime. 3.  Continue to monitor mood, behavior and interaction with others. 4.  Continue to encourage unit groups and therapeutic activities. 5.  Social worker will coordinate discharge and aftercare planning. 6.  Depakote level, CMP and CBC on Saturday morning.    Georgiann Cocker, MD 01/21/2024, 11:06 AM

## 2024-01-21 NOTE — Group Note (Signed)
 Recreation Therapy Group Note   Group Topic:Self-Esteem  Group Date: 01/21/2024 Start Time: 0945 End Time: 1035 Facilitators: Julicia Krieger-McCall, LRT,CTRS Location: [address] Dayroom   Group Topic: Self-Esteem  Goal Area(s) Addresses:  Patient will successfully identify positive attributes about themselves.  Patient will identify healthy ways to increase self-esteem. Patient will acknowledge benefit(s) of improved self-esteem.   Intervention: Sheet with blank mirror, markers  Activity: Patients were asked to define self esteem. LRT and patients then discussed the differences between negative and positive self esteem. LRT then gave patients a picture of a blank mirror. Patients were to identify the positive qualities about themselves as they saw it in the mirror. Patients could express these qualities through words or drawings.  Education:  Self-Esteem, Discharge Planning  Education Outcome: Acknowledges education/In group clarification offered/Needs additional education   Affect/Mood: Appropriate   Participation Level: Engaged   Participation Quality: Independent   Behavior: Appropriate   Speech/Thought Process: Focused   Insight: Good   Judgement: Good   Modes of Intervention: Art and Worksheet   Patient Response to Interventions:  Engaged   Education Outcome:  In group clarification offered    Clinical Observations/Individualized Feedback: Pt was quiet in the beginning of group but opened as group went on. Pt very vocal and descriptive of his qualities and the struggles he has in other people seeing those qualities. Pt expressed some of his positive qualities as strong, kind, quiet, intelligent, smart, good learner, good friend, athletic, loves to eat, energetic, positive and loves God/Jesus. Pt stated "it's was hard for people to see those qualities because they are blocked by my paranoia". Pt also stated "nobody gives me a chance to see those qualities". Pt went  on to say "if people gave a chance, they would see I can be a hell of a friend". Pt was engaged and interacted well with peers throughout group session.      Plan: Continue to engage patient in RT group sessions 2-3x/week.   Sonia Bromell-McCall, LRT,CTRS 01/21/2024 11:44 AM

## 2024-01-21 NOTE — Progress Notes (Signed)
   01/21/24 1000  Psych Admission Type (Psych Patients Only)  Admission Status Voluntary  Psychosocial Assessment  Patient Complaints Anxiety  Eye Contact Fair  Facial Expression Anxious  Affect Anxious  Speech Slow  Interaction Cautious  Motor Activity Slow  Appearance/Hygiene Unremarkable  Behavior Characteristics Cooperative  Mood Anxious  Thought Process  Coherency Circumstantial  Content Preoccupation  Delusions None reported or observed  Perception WDL  Hallucination None reported or observed  Judgment Impaired  Confusion None  Danger to Self  Current suicidal ideation? Denies  Danger to Others  Danger to Others None reported or observed   Dar Note: Patient presents calm and appropriate.  Antibiotic therapy continues.  No adverse reaction noted.  Fluid intake encouraged.  Patient safety maintained.

## 2024-01-21 NOTE — Progress Notes (Signed)
 Collateral contact - Styles Fambro (mother) [phone]  Mom said that a few days ago, patient called her and shared suicidal ideations. She mentioned that a nurse called her yesterday and informed her that he was taken to [facility] due to an infection in his stomach.  Mom said that patient told her he is taking new medications for just 4 days, and she feels that he needs more time before being discharged from the hospital.  She said that she didn't want patient to know she wants him to stay longer in the hospital.  Mom said she told patient that he can return to her home when he feels better.  She said that he is scared to go outside, and she hopes he will overcome it, and be able to go with them to a restaurant.  Mom said that she visited him on Sunday.  She also said that she can't visit him today because she doesn't drive due to medical issues (she takes a lot of medications).   Read Drivers, LCSWA 01/21/2024

## 2024-01-21 NOTE — [facility] Group Notes (Signed)
 Patient attended the [facility] group.

## 2024-01-21 NOTE — Plan of Care (Signed)
   Problem: Education: Goal: Emotional status will improve Outcome: Progressing   Problem: Activity: Goal: Interest or engagement in activities will improve Outcome: Progressing

## 2024-01-22 ENCOUNTER — Encounter (HOSPITAL_COMMUNITY): Payer: Self-pay

## 2024-01-22 DIAGNOSIS — F250 Schizoaffective disorder, bipolar type: Secondary | ICD-10-CM | POA: Diagnosis not present

## 2024-01-22 NOTE — [facility] Group Notes (Signed)
 Spirituality Group: Focus on relationships, empowerment, and meaning making  Group dynamics informed by Myriam Jacobson and Rogerian therapeutic approaches.  Observation: Rodney Thomas was an active participant for the full group time. He made a good effort at engaging and expressing what he finds challenging and reflecting when invited to locate areas for gratitude. He seemed eager to engage in relationship building and coping with isolation.  Anneth Brunell L. Sophronia Simas, M.Div [phone]

## 2024-01-22 NOTE — Progress Notes (Signed)
 [facility] MD Progress Note  01/22/2024 8:06 PM Rodney Thomas  MRN:  [id number] Subjective:   33 year old African-American male, single, unemployed, lives with his mother.  Known history of schizoaffective disorder bipolar type.  Hospitalized on account of worsening psychosis and mania.  Preoccupied with delusional belief.  Reports auditory hallucinations commanding him to end his life.  Recently incarcerated on account of communicating threat.  He has a pending legal charge. Routine labs are essentially normal.  No alcohol or any psychoactive substance on board.  Chart reviewed today.  Patient discussed at multidisciplinary team meeting.  Staff reports that patient slept well last night.  He has been taking his medicine as recommended.  No challenging behavior.  He continues to endorse ideas of reference.  Social worker spoke with patient's mother.  Mother was hoping for more hospital days.  No concerns about dangerousness.  Seen today.  Patient tells me that he has continued to make progress.  States that he feels like the unit is becoming chaotic with new patients coming in.  States that he feels like he is ready to go back home as most of his symptoms are completely resolved.  He has insight into his ideas of reference.  He does not have any rageful thoughts towards anybody or to himself.  States that he is tolerating his medicines well.  He is not experiencing any hallucinations.  He is not experiencing any passivity phenomenon.  Patient plans to call his mother and convince his mother that his well enough to come back home. Encouraged to keep ventilating his feelings to staff.   Principal Problem: Schizoaffective disorder, bipolar type (HCC) Diagnosis: Principal Problem:   Schizoaffective disorder, bipolar type (HCC) Active Problems:   Paranoid (HCC)   Chronic insomnia   Obsessive-compulsive behavior   Vitamin D deficiency  Total Time spent with patient: 20 minutes  Past Psychiatric  History:  See H&P  Past Medical History:  Past Medical History:  Diagnosis Date   Chronic insomnia 01/16/2024   Obsessive-compulsive behavior 01/16/2024   Schizoaffective disorder, bipolar type (HCC) 01/16/2024   Seasonal allergies    Vitamin D deficiency 01/17/2024    Past Surgical History:  Procedure Laterality Date   APPENDECTOMY     ORIF RADIAL FRACTURE Left 03/17/2014   Procedure: OPEN REDUCTION INTERNAL FIXATION (ORIF) RADIAL FRACTURE;  Surgeon: Eldred Manges, MD;  Location: [facility] OR;  Service: Orthopedics;  Laterality: Left;  Open Reduction Internal Fixation Left Radial Shaft Fracture   Family History: History reviewed. No pertinent family history. Family Psychiatric  History:  See H&P  Social History:  Social History   Substance and Sexual Activity  Alcohol Use No     Social History   Substance and Sexual Activity  Drug Use No    Social History   Socioeconomic History   Marital status: Single    Spouse name: Not on file   Number of children: Not on file   Years of education: Not on file   Highest education level: Not on file  Occupational History   Not on file  Tobacco Use   Smoking status: Never   Smokeless tobacco: Never  Substance and Sexual Activity   Alcohol use: No   Drug use: No   Sexual activity: Not on file  Other Topics Concern   Not on file  Social History Narrative   Not on file   Social Drivers of Health   Financial Resource Strain: Not on file  Food Insecurity: No Food Insecurity (01/22/2024)  Hunger Vital Sign    Worried About Running Out of Food in the Last Year: Never true    Ran Out of Food in the Last Year: Never true  Transportation Needs: No Transportation Needs (01/22/2024)   PRAPARE - Administrator, Civil Service (Medical): No    Lack of Transportation (Non-Medical): No  Physical Activity: Not on file  Stress: Not on file  Social Connections: Not on file   Additional Social History:    Current  Medications: Current Facility-Administered Medications  Medication Dose Route Frequency Provider Last Rate Last Admin   acetaminophen (TYLENOL) tablet 650 mg  650 mg Oral Q6H PRN Motley-Mangrum, [name], PMHNP   650 mg at 01/17/24 2229   alum & mag hydroxide-simeth (MAALOX/MYLANTA) 200-200-20 MG/5ML suspension 30 mL  30 mL Oral Q4H PRN Motley-Mangrum, [name], PMHNP   30 mL at 01/20/24 0643   amoxicillin-clavulanate (AUGMENTIN) 875-125 MG per tablet 1 tablet  1 tablet Oral Q12H Sindy Guadeloupe, NP   1 tablet at 01/22/24 6644   haloperidol (HALDOL) tablet 5 mg  5 mg Oral TID PRN Motley-Mangrum, [name], PMHNP       And   diphenhydrAMINE (BENADRYL) capsule 50 mg  50 mg Oral TID PRN Motley-Mangrum, [name], PMHNP       haloperidol lactate (HALDOL) injection 5 mg  5 mg Intramuscular TID PRN Motley-Mangrum, [name], PMHNP       And   diphenhydrAMINE (BENADRYL) injection 50 mg  50 mg Intramuscular TID PRN Motley-Mangrum, [name], PMHNP       And   LORazepam (ATIVAN) injection 2 mg  2 mg Intramuscular TID PRN Motley-Mangrum, [name], PMHNP       haloperidol lactate (HALDOL) injection 10 mg  10 mg Intramuscular TID PRN Motley-Mangrum, [name], PMHNP       And   diphenhydrAMINE (BENADRYL) injection 50 mg  50 mg Intramuscular TID PRN Motley-Mangrum, [name], PMHNP       And   LORazepam (ATIVAN) injection 2 mg  2 mg Intramuscular TID PRN Motley-Mangrum, [name], PMHNP       divalproex (DEPAKOTE ER) 24 hr tablet 2,000 mg  2,000 mg Oral QHS Tenae Graziosi A, MD   2,000 mg at 01/21/24 2033   hydrOXYzine (ATARAX) tablet 25 mg  25 mg Oral TID PRN Motley-Mangrum, [name], PMHNP   25 mg at 01/20/24 0027   hyoscyamine (LEVSIN SL) SL tablet 0.25 mg  0.25 mg Sublingual Q6H PRN Sindy Guadeloupe, NP   0.25 mg at 01/21/24 2034   magnesium hydroxide (MILK OF MAGNESIA) suspension 30 mL  30 mL Oral Daily PRN Motley-Mangrum, [name], PMHNP       ondansetron (ZOFRAN) tablet 4 mg  4 mg Oral Q8H PRN Sindy Guadeloupe,  NP       risperiDONE (RISPERDAL) tablet 6 mg  6 mg Oral QHS Leighann Amadon A, MD   6 mg at 01/21/24 2033   traZODone (DESYREL) tablet 50 mg  50 mg Oral QHS PRN Motley-Mangrum, [name], PMHNP   50 mg at 01/19/24 2157   traZODone (DESYREL) tablet 50 mg  50 mg Oral QHS [name], MD   50 mg at 01/19/24 2103   Vitamin D (Ergocalciferol) (DRISDOL) 1.25 MG (50000 UNIT) capsule 50,000 Units  50,000 Units Oral Q7 days [name], MD   50,000 Units at 01/17/24 0347   vitamin D3 (CHOLECALCIFEROL) tablet 2,000 Units  2,000 Units Oral BID [name], MD  2,000 Units at 01/22/24 1632    Lab Results:  No results found for this or any previous visit (from the past 48 hours).    Blood Alcohol level:  Lab Results  Component Value Date   ETH <10 01/14/2024    Metabolic Disorder Labs: Lab Results  Component Value Date   HGBA1C 5.4 01/16/2024   MPG 108.28 01/16/2024   No results found for: "PROLACTIN" Lab Results  Component Value Date   CHOL 164 01/16/2024   TRIG 177 (H) 01/16/2024   HDL 57 01/16/2024   CHOLHDL 2.9 01/16/2024   VLDL 35 01/16/2024   LDLCALC 72 01/16/2024    Physical Findings: AIMS:  , ,  ,  ,    CIWA:    COWS:     Musculoskeletal: Strength & Muscle Tone: within normal limits Gait & Station: normal Patient leans: N/A  Psychiatric Specialty Exam:  Presentation  General Appearance:  Slim build, not in any distress, good relatedness, appropriate behavior.  No EPS.  Eye Contact: Good.  Speech: Spontaneous, normal rate, tone and volume.  Mood and Affect  Mood: Euthymic.  Affect: Mood congruent.  Thought Process  Thought Processes: Normal speed of thought.  Goal directed.  Descriptions of Associations:Intact  Orientation:Full (Time, Place and Person)  Thought Content: Ideas of reference.  No delusional preoccupation.  No negative rumination.  No guilty ruminations.  No suicidal thoughts.  No homicidal thoughts.  No thoughts of  violence.  No obsessions.  Hallucinations: No hallucination in any modality.  Sensorium  Memory: Good.  Judgment: Good.  Insight: Good.  Executive Functions  Concentration: Good  Attention Span: Good  Recall: Good  Fund of Knowledge: Good  Language: Good   Psychomotor Activity  Normalizing psychomotor activity  Physical Exam: Physical Exam ROS Blood pressure 118/71, pulse 90, temperature (!) 97.3 F (36.3 C), temperature source Oral, resp. rate 16, height 5\' 10"  (1.778 m), weight 72.6 kg, SpO2 100%. Body mass index is 22.96 kg/m.   Treatment Plan Summary: Patient has responded well to treatment.  His symptoms are mostly resolved.  Residual ideas of reference.  No dangerousness.  Patient is insightful and plans to stay on his medicine.  We are finalizing aftercare.  Hopeful discharge this weekend.  1.  Risperidone 6 mg at bedtime only. 2.  Depakote ER 2000 mg at bedtime. 3.  Continue to monitor mood, behavior and interaction with others. 4.  Continue to encourage unit groups and therapeutic activities. 5.  Social worker will coordinate discharge and aftercare planning.   Georgiann Cocker, MD 01/22/2024, 8:06 PM

## 2024-01-22 NOTE — BHH Group Notes (Signed)
 Adult Psychoeducational Group Note  Date:  01/22/2024 Time:  9:40 AM  Group Topic/Focus:  Goals Group:   The focus of this group is to help patients establish daily goals to achieve during treatment and discuss how the patient can incorporate goal setting into their daily lives to aide in recovery. Orientation:   The focus of this group is to educate the patient on the purpose and policies of crisis stabilization and provide a format to answer questions about their admission.  The group details unit policies and expectations of patients while admitted.  Participation Level:  Active  Participation Quality:  Appropriate  Affect:  Appropriate  Cognitive:  Appropriate  Insight: Appropriate  Engagement in Group:  Engaged  Modes of Intervention:  Discussion  Additional Comments:  Pt shared goals with the group  Brezlyn Manrique L Cheree Ditto 01/22/2024, 9:40 AM

## 2024-01-22 NOTE — Progress Notes (Signed)
 Patient was very hesitant to take his bedtime medications. He stated that he believed his medications gave him colitis and he was scared to take the big Depakote pills because he did not want to start feeling sick again. Pt educated on medications and encouraged to eat a snack with med to prevent GI upset.   Pt was able to take all of his bedtime medications but refused his bedtime Trazodone.

## 2024-01-22 NOTE — BH IP Treatment Plan (Signed)
 Interdisciplinary Treatment and Diagnostic Plan Update  01/22/2024 Time of Session: 12:00 PM - UPDATE Ioannis Schuh MRN: [id number]  Principal Diagnosis: Schizoaffective disorder, bipolar type (HCC)  Secondary Diagnoses: Principal Problem:   Schizoaffective disorder, bipolar type (HCC) Active Problems:   Paranoid (HCC)   Chronic insomnia   Obsessive-compulsive behavior   Vitamin D deficiency   Current Medications:  Current Facility-Administered Medications  Medication Dose Route Frequency Provider Last Rate Last Admin   acetaminophen (TYLENOL) tablet 650 mg  650 mg Oral Q6H PRN Motley-Mangrum, [name], PMHNP   650 mg at 01/17/24 2229   alum & mag hydroxide-simeth (MAALOX/MYLANTA) 200-200-20 MG/5ML suspension 30 mL  30 mL Oral Q4H PRN Motley-Mangrum, [name], PMHNP   30 mL at 01/20/24 0643   amoxicillin-clavulanate (AUGMENTIN) 875-125 MG per tablet 1 tablet  1 tablet Oral Q12H Sindy Guadeloupe, NP   1 tablet at 01/22/24 4098   haloperidol (HALDOL) tablet 5 mg  5 mg Oral TID PRN Motley-Mangrum, Geralynn Ochs A, PMHNP       And   diphenhydrAMINE (BENADRYL) capsule 50 mg  50 mg Oral TID PRN Motley-Mangrum, [name], PMHNP       haloperidol lactate (HALDOL) injection 5 mg  5 mg Intramuscular TID PRN Motley-Mangrum, [name], PMHNP       And   diphenhydrAMINE (BENADRYL) injection 50 mg  50 mg Intramuscular TID PRN Motley-Mangrum, [name], PMHNP       And   LORazepam (ATIVAN) injection 2 mg  2 mg Intramuscular TID PRN Motley-Mangrum, [name], PMHNP       haloperidol lactate (HALDOL) injection 10 mg  10 mg Intramuscular TID PRN Motley-Mangrum, [name], PMHNP       And   diphenhydrAMINE (BENADRYL) injection 50 mg  50 mg Intramuscular TID PRN Motley-Mangrum, [name], PMHNP       And   LORazepam (ATIVAN) injection 2 mg  2 mg Intramuscular TID PRN Motley-Mangrum, [name], PMHNP       divalproex (DEPAKOTE ER) 24 hr tablet 2,000 mg  2,000 mg Oral QHS [name], [name], MD   2,000 mg at 01/21/24  2033   hydrOXYzine (ATARAX) tablet 25 mg  25 mg Oral TID PRN Motley-Mangrum, [name], PMHNP   25 mg at 01/20/24 0027   hyoscyamine (LEVSIN SL) SL tablet 0.25 mg  0.25 mg Sublingual Q6H PRN Sindy Guadeloupe, NP   0.25 mg at 01/21/24 2034   magnesium hydroxide (MILK OF MAGNESIA) suspension 30 mL  30 mL Oral Daily PRN Motley-Mangrum, [name], PMHNP       ondansetron (ZOFRAN) tablet 4 mg  4 mg Oral Q8H PRN Sindy Guadeloupe, NP       risperiDONE (RISPERDAL) tablet 6 mg  6 mg Oral QHS [name], [name], MD   6 mg at 01/21/24 2033   traZODone (DESYREL) tablet 50 mg  50 mg Oral QHS PRN Motley-Mangrum, [name], PMHNP   50 mg at 01/19/24 2157   traZODone (DESYREL) tablet 50 mg  50 mg Oral QHS [name], MD   50 mg at 01/19/24 2103   Vitamin D (Ergocalciferol) (DRISDOL) 1.25 MG (50000 UNIT) capsule 50,000 Units  50,000 Units Oral Q7 days [name], MD   50,000 Units at 01/17/24 1191   vitamin D3 (CHOLECALCIFEROL) tablet 2,000 Units  2,000 Units Oral BID [name], MD   2,000 Units at 01/22/24 1632   PTA Medications: Medications Prior to Admission  Medication Sig Dispense Refill Last Dose/Taking  predniSONE (DELTASONE) 50 MG tablet Take 1 tablet (50 mg total) by mouth daily. (Patient not taking: Reported on 01/14/2024) 5 tablet 0    traMADol (ULTRAM) 50 MG tablet Take 1 tablet (50 mg total) by mouth every 6 (six) hours as needed. (Patient not taking: Reported on 01/14/2024) 15 tablet 0     Patient Stressors: Legal issue   Marital or family conflict    Patient Strengths: Ability for insight  Printmaker for treatment/growth  Physical Health  Supportive family/friends   Treatment Modalities: Medication Management, Group therapy, Case management,  1 to 1 session with clinician, Psychoeducation, Recreational therapy.   Physician Treatment Plan for Primary Diagnosis: Schizoaffective disorder, bipolar type (HCC) Long Term Goal(s):     Short Term Goals:     Medication Management: Evaluate patient's response, side effects, and tolerance of medication regimen.  Therapeutic Interventions: 1 to 1 sessions, Unit Group sessions and Medication administration.  Evaluation of Outcomes: Progressing  Physician Treatment Plan for Secondary Diagnosis: Principal Problem:   Schizoaffective disorder, bipolar type (HCC) Active Problems:   Paranoid (HCC)   Chronic insomnia   Obsessive-compulsive behavior   Vitamin D deficiency  Long Term Goal(s):     Short Term Goals:       Medication Management: Evaluate patient's response, side effects, and tolerance of medication regimen.  Therapeutic Interventions: 1 to 1 sessions, Unit Group sessions and Medication administration.  Evaluation of Outcomes: Progressing   RN Treatment Plan for Primary Diagnosis: Schizoaffective disorder, bipolar type (HCC) Long Term Goal(s): Knowledge of disease and therapeutic regimen to maintain health will improve  Short Term Goals: Ability to remain free from injury will improve, Ability to verbalize frustration and anger appropriately will improve, Ability to participate in decision making will improve, Ability to verbalize feelings will improve, Ability to identify and develop effective coping behaviors will improve, and Compliance with prescribed medications will improve  Medication Management: RN will administer medications as ordered by provider, will assess and evaluate patient's response and provide education to patient for prescribed medication. RN will report any adverse and/or side effects to prescribing provider.  Therapeutic Interventions: 1 on 1 counseling sessions, Psychoeducation, Medication administration, Evaluate responses to treatment, Monitor vital signs and CBGs as ordered, Perform/monitor CIWA, COWS, AIMS and Fall Risk screenings as ordered, Perform wound care treatments as ordered.  Evaluation of Outcomes: Progressing   LCSW Treatment Plan for Primary  Diagnosis: Schizoaffective disorder, bipolar type (HCC) Long Term Goal(s): Safe transition to appropriate next level of care at discharge, Engage patient in therapeutic group addressing interpersonal concerns.  Short Term Goals: Engage patient in aftercare planning with referrals and resources, Increase ability to appropriately verbalize feelings, Facilitate acceptance of mental health diagnosis and concerns, and Identify triggers associated with mental health/substance abuse issues  Therapeutic Interventions: Assess for all discharge needs, 1 to 1 time with Social worker, Explore available resources and support systems, Assess for adequacy in community support network, Educate family and significant other(s) on suicide prevention, Complete Psychosocial Assessment, Interpersonal group therapy.  Evaluation of Outcomes: Progressing   Progress in Treatment: Attending groups: Yes. Participating in groups: Yes. Taking medication as prescribed: Yes. Toleration medication: Yes. Family/Significant other contact made: No, will contact:  mother Naol Ontiveros [phone] Patient understands diagnosis: Yes. Discussing patient identified problems/goals with staff: Yes. Medical problems stabilized or resolved: Yes. Denies suicidal/homicidal ideation: Yes. Issues/concerns per patient self-inventory: Yes.   New problem(s) identified: No, Describe:  n/a   New Short Term/Long Term Goal(s): medication stabilization,  elimination of SI thoughts, development of comprehensive mental wellness plan.     Patient Goals: "To remove all fear of things"   Discharge Plan or Barriers: Patient recently admitted. CSW will continue to follow and assess for appropriate referrals and possible discharge planning.     Reason for Continuation of Hospitalization: Delusions  Medication stabilization Other; describe mood stabilization, discharge planning   Estimated Length of Stay:  1 - 2 days  Last 3 [place] Suicide  Severity Risk Score: Flowsheet Row ED to Hosp-Admission (Current) [facility] from [facility]14[facility]2024 in [facility] at [facility]  C-SSRS RISK CATEGORY No Risk High Risk       Last [facility] 2/9 Scores:     No data to display          Scribe for Treatment Team: Nyra Jabs 01/22/2024 8:34 PM

## 2024-01-22 NOTE — Progress Notes (Signed)
   01/22/24 2035  Psych Admission Type (Psych Patients Only)  Admission Status Voluntary  Psychosocial Assessment  Patient Complaints Worrying  Eye Contact Fair  Facial Expression Anxious  Affect Appropriate to circumstance;Anxious  Speech Logical/coherent  Interaction Assertive;Cautious  Motor Activity Other (Comment) (wnl)  Appearance/Hygiene Unremarkable  Behavior Characteristics Cooperative  Mood Anxious;Pleasant  Thought Process  Coherency WDL  Content Preoccupation  Delusions None reported or observed  Perception WDL  Hallucination None reported or observed  Judgment Poor  Confusion None  Danger to Self  Current suicidal ideation? Denies

## 2024-01-22 NOTE — Group Note (Signed)
 Recreation Therapy Group Note   Group Topic:Communication  Group Date: 01/22/2024 Start Time: 1006 End Time: 1020 Facilitators: Maude Hettich-McCall, LRT,CTRS Location: [address] Dayroom   Group Topic: Communication, Team Building, Problem Solving  Goal Area(s) Addresses:  Patient will effectively work with peer towards shared goal.  Patient will identify skills used to make activity successful.  Patient will identify how skills used during activity can be applied to reach post d/c goals.   Intervention: STEM Activity- Glass blower/designer  Activity: Tallest [employer]. In teams of 5-6, patients were given 11 craft pipe cleaners. Using the materials provided, patients were instructed to compete again the opposing team(s) to build the tallest free-standing structure from floor level. The activity was timed; difficulty increased by Clinical research associate as Production designer, theatre/television/film continued.  Systematically resources were removed with additional directions for example, placing one arm behind their back, working in silence, and shape stipulations. LRT facilitated post-activity discussion reviewing team processes and necessary communication skills involved in completion. Patients were encouraged to reflect how the skills utilized, or not utilized, in this activity can be incorporated to positively impact support systems post discharge.  Education: Pharmacist, community, Scientist, physiological, Discharge Planning   Education Outcome: Acknowledges education/In group clarification offered/Needs additional education.   Affect/Mood: Appropriate   Participation Level: Engaged   Participation Quality: Independent   Behavior: Appropriate   Speech/Thought Process: Focused   Insight: Good   Judgement: Good   Modes of Intervention: STEM Activity   Patient Response to Interventions:  Engaged   Education Outcome:  In group clarification offered    Clinical Observations/Individualized Feedback: Pt ended up working  by himself due to no assistance from peers. Pt was able to construct the tower on his own. Pt was bright, social and engaged throughout activity.     Plan: Continue to engage patient in RT group sessions 2-3x/week.   Nayanna Seaborn-McCall, LRT,CTRS 01/22/2024 12:46 PM

## 2024-01-23 DIAGNOSIS — F250 Schizoaffective disorder, bipolar type: Secondary | ICD-10-CM | POA: Diagnosis not present

## 2024-01-23 DIAGNOSIS — F429 Obsessive-compulsive disorder, unspecified: Secondary | ICD-10-CM

## 2024-01-23 DIAGNOSIS — E559 Vitamin D deficiency, unspecified: Secondary | ICD-10-CM

## 2024-01-23 DIAGNOSIS — G4709 Other insomnia: Secondary | ICD-10-CM

## 2024-01-23 LAB — COMPREHENSIVE METABOLIC PANEL
ALT: 51 U/L — ABNORMAL HIGH (ref 0–44)
AST: 24 U/L (ref 15–41)
Albumin: 3.7 g/dL (ref 3.5–5.0)
Alkaline Phosphatase: 50 U/L (ref 38–126)
Anion gap: 8 (ref 5–15)
BUN: 12 mg/dL (ref 6–20)
CO2: 28 mmol/L (ref 22–32)
Calcium: 9.4 mg/dL (ref 8.9–10.3)
Chloride: 104 mmol/L (ref 98–111)
Creatinine, Ser: 0.79 mg/dL (ref 0.61–1.24)
GFR, Estimated: >60 mL/min (ref >60)
Glucose, Bld: 72 mg/dL (ref 70–99)
Potassium: 4 mmol/L (ref 3.5–5.1)
Sodium: 140 mmol/L (ref 135–145)
Total Bilirubin: 0.4 mg/dL (ref 0.0–1.2)
Total Protein: 6.5 g/dL (ref 6.5–8.1)

## 2024-01-23 LAB — CBC WITH DIFFERENTIAL/PLATELET
Abs Immature Granulocytes: 0.13 10*3/uL — ABNORMAL HIGH (ref 0.00–0.07)
Basophils Absolute: 0 10*3/uL (ref 0.0–0.1)
Basophils Relative: 1 %
Eosinophils Absolute: 0.5 10*3/uL (ref 0.0–0.5)
Eosinophils Relative: 8 %
HCT: 44.3 % (ref 39.0–52.0)
Hemoglobin: 14 g/dL (ref 13.0–17.0)
Immature Granulocytes: 2 %
Lymphocytes Relative: 40 %
Lymphs Abs: 2.5 10*3/uL (ref 0.7–4.0)
MCH: 27.8 pg (ref 26.0–34.0)
MCHC: 31.6 g/dL (ref 30.0–36.0)
MCV: 87.9 fL (ref 80.0–100.0)
Monocytes Absolute: 0.4 10*3/uL (ref 0.1–1.0)
Monocytes Relative: 6 %
Neutro Abs: 2.7 10*3/uL (ref 1.7–7.7)
Neutrophils Relative %: 43 %
Platelets: 200 10*3/uL (ref 150–400)
RBC: 5.04 MIL/uL (ref 4.22–5.81)
RDW: 16 % — ABNORMAL HIGH (ref 11.5–15.5)
WBC: 6.3 10*3/uL (ref 4.0–10.5)
nRBC: 0 % (ref 0.0–0.2)

## 2024-01-23 LAB — VALPROIC ACID LEVEL: Valproic Acid Lvl: 105 ug/mL — ABNORMAL HIGH (ref 50.0–100.0)

## 2024-01-23 MED ORDER — RISPERIDONE 3 MG PO TABS
6.0000 mg | ORAL_TABLET | Freq: Every day | ORAL | 0 refills | Status: DC
Start: 2024-01-23 — End: 2024-02-11
  Filled 2024-02-01: qty 60, 30d supply, fill #0

## 2024-01-23 MED ORDER — AMOXICILLIN-POT CLAVULANATE 875-125 MG PO TABS: 1.0000 | ORAL_TABLET | Freq: Two times a day (BID) | ORAL | 0 refills | Status: AC

## 2024-01-23 MED ORDER — DIVALPROEX SODIUM ER 500 MG PO TB24
2000.0000 mg | ORAL_TABLET | Freq: Every day | ORAL | 0 refills | Status: DC
Start: 2024-01-23 — End: 2024-02-11
  Filled 2024-02-01: qty 120, 30d supply, fill #0

## 2024-01-23 MED ORDER — TRAZODONE HCL 50 MG PO TABS
50.0000 mg | ORAL_TABLET | Freq: Every day | ORAL | 0 refills | Status: DC
  Filled 2024-02-01: qty 30, 30d supply, fill #0

## 2024-01-23 NOTE — Progress Notes (Signed)
  [facility] Adult Case Management Discharge Plan :  Will you be returning to the same living situation after discharge:  Yes,  Patient is going home to his mother At discharge, do you have transportation home?: Yes,  Patient will be taking a [employer] Do you have the ability to pay for your medications: Yes,  Patient share that he has insurance  Release of information consent forms completed and in the chart;  Patient's signature needed at discharge.  Patient to Follow up at:  Follow-up Information     Willis Modena, MD. Schedule an appointment as soon as possible for a visit .   Specialty: Gastroenterology Contact information: [address] [place] [address] [phone]         [facility] Go on 02/01/2024.   Why: You have a hospital follow up appointment, to obtain therapy and medication management services on 02/01/24 at 2:30 pm. Contact information: [address] [place] [address] [phone]                 Next level of care provider has access to [facility] Link:no  Safety Planning and Suicide Prevention discussed: Yes,  CSW called and spoke to mother  01/19/2024     Has patient been referred to the Quitline?: Patient refused referral for treatment  Patient has been referred for addiction treatment: Patient refused referral for treatment; referral information given to patient at discharge.  Janene Yousuf O Salome Cozby, LCSWA 01/23/2024, 9:22 AM

## 2024-01-23 NOTE — BHH Suicide Risk Assessment (Signed)
 [facility] Discharge Suicide Risk Assessment   Principal Problem: Schizoaffective disorder, bipolar type (HCC) Discharge Diagnoses: Principal Problem:   Schizoaffective disorder, bipolar type (HCC) Active Problems:   Paranoid (HCC)   Chronic insomnia   Obsessive-compulsive behavior   Vitamin D deficiency   Total Time spent with patient: 20 minutes  Musculoskeletal: Strength & Muscle Tone: within normal limits Gait & Station: normal Patient leans: N/A  Psychiatric Specialty Exam  Presentation  General Appearance:  Slim build, casually dressed, engage politely.  No EPS.   Eye Contact: Good.   Speech: Spontaneous, normal rate, tone and volume.   Mood and Affect  Mood: Euthymic.   Affect: Full range and mood congruent.   Thought Process  Thought Processes: Normal speed of thought.  Goal directed.   Descriptions of Associations:Intact   Orientation:Full (Time, Place and Person)   Thought Content: No delusional theme.  No negative rumination.  No guilty ruminations.  Patient is future oriented.  No suicidal thoughts.  No homicidal thoughts.  No thoughts of violence.  No obsessions.   Hallucinations: No hallucination in any modality.   Sensorium  Memory: Good.   Judgment: Good.   Insight: Good.   Executive Functions  Concentration: Good   Attention Span: Good   Recall: Good   Fund of Knowledge: Good   Language: Good     Psychomotor Activity  Normalizing psychomotor activity  Physical Exam: Physical Exam ROS Blood pressure 122/76, pulse 93, temperature 97.7 F (36.5 C), temperature source Oral, resp. rate 16, height 5\' 10"  (1.778 m), weight 72.6 kg, SpO2 100%. Body mass index is 22.96 kg/m.  Mental Status Per Nursing Assessment::   On Admission:  Suicidal ideation indicated by patient, Self-harm thoughts  Demographic Factors:  Male and Unemployed  Loss Factors: Decrease in vocational status  Historical Factors: Family history of  mental illness or substance abuse  Risk Reduction Factors:   Sense of responsibility to family, Living with another person, especially a relative, Positive social support, Positive therapeutic relationship, and Positive coping skills or problem solving skills  Continued Clinical Symptoms:  Mania and psychosis has completely resolved.    Cognitive Features That Contribute To Risk:  None    Suicide Risk:  Minimal: Patient is not endorsing any suicidal thoughts.  He is not endorsing any homicidal thoughts.  He is not endorsing any thoughts of violence.  Modifiable risk factor targeted during this admission as psychosis and mania.  He is on an antipsychotic medication and a mood stabilizer.  He has responded well to treatment.  There are no new stressors.  No substance use. At this point in time, he is not a danger to himself or anyone else.  He is stable for care at the lower setting.   Follow-up Information     Willis Modena, MD. Schedule an appointment as soon as possible for a visit .   Specialty: Gastroenterology Contact information: [address] [place] [address] [phone]         [facility] Go on 02/01/2024.   Why: You have a hospital follow up appointment, to obtain therapy and medication management services on 02/01/24 at 2:30 pm. Contact information: [address] [place] [address] [phone]                 Plan Of Care/Follow-up recommendations:  See discharge summary.  Georgiann Cocker, MD 01/23/2024, 9:55 AM

## 2024-01-23 NOTE — Progress Notes (Signed)
 Patient discharged to home via cab. Denies, SI/HI, AVH at time of discharge. All belongings in locker returned. Discussed appointments, medications, patient verbalized understanding.

## 2024-01-23 NOTE — Plan of Care (Signed)
  Problem: Education: Goal: Knowledge of [facility] Education information/materials will improve Outcome: Adequate for Discharge Goal: Emotional status will improve Outcome: Adequate for Discharge Goal: Mental status will improve Outcome: Adequate for Discharge Goal: Verbalization of understanding the information provided will improve Outcome: Adequate for Discharge   Problem: Activity: Goal: Interest or engagement in activities will improve Outcome: Adequate for Discharge Goal: Sleeping patterns will improve Outcome: Adequate for Discharge   Problem: Coping: Goal: Ability to verbalize frustrations and anger appropriately will improve Outcome: Adequate for Discharge Goal: Ability to demonstrate self-control will improve Outcome: Adequate for Discharge   Problem: Health Behavior/Discharge Planning: Goal: Identification of resources available to assist in meeting health care needs will improve Outcome: Adequate for Discharge Goal: Compliance with treatment plan for underlying cause of condition will improve Outcome: Adequate for Discharge   Problem: Physical Regulation: Goal: Ability to maintain clinical measurements within normal limits will improve Outcome: Adequate for Discharge   Problem: Safety: Goal: Periods of time without injury will increase Outcome: Adequate for Discharge   Problem: Activity: Goal: Will identify at least one activity in which they can participate Outcome: Adequate for Discharge   Problem: Coping: Goal: Ability to identify and develop effective coping behavior will improve Outcome: Adequate for Discharge Goal: Ability to interact with others will improve Outcome: Adequate for Discharge Goal: Demonstration of participation in decision-making regarding own care will improve Outcome: Adequate for Discharge Goal: Ability to use eye contact when communicating with others will improve Outcome: Adequate for Discharge   Problem: Health  Behavior/Discharge Planning: Goal: Identification of resources available to assist in meeting health care needs will improve Outcome: Adequate for Discharge   Problem: Self-Concept: Goal: Will verbalize positive feelings about self Outcome: Adequate for Discharge   Problem: Education: Goal: Ability to state activities that reduce stress will improve Outcome: Adequate for Discharge   Problem: Coping: Goal: Ability to identify and develop effective coping behavior will improve Outcome: Adequate for Discharge   Problem: Self-Concept: Goal: Ability to identify factors that promote anxiety will improve Outcome: Adequate for Discharge Goal: Level of anxiety will decrease Outcome: Adequate for Discharge Goal: Ability to modify response to factors that promote anxiety will improve Outcome: Adequate for Discharge   Problem: Activity: Goal: Will verbalize the importance of balancing activity with adequate rest periods Outcome: Adequate for Discharge   Problem: Education: Goal: Will be free of psychotic symptoms Outcome: Adequate for Discharge Goal: Knowledge of the prescribed therapeutic regimen will improve Outcome: Adequate for Discharge   Problem: Coping: Goal: Coping ability will improve Outcome: Adequate for Discharge Goal: Will verbalize feelings Outcome: Adequate for Discharge   Problem: Health Behavior/Discharge Planning: Goal: Compliance with prescribed medication regimen will improve Outcome: Adequate for Discharge   Problem: Nutritional: Goal: Ability to achieve adequate nutritional intake will improve Outcome: Adequate for Discharge   Problem: Role Relationship: Goal: Ability to communicate needs accurately will improve Outcome: Adequate for Discharge Goal: Ability to interact with others will improve Outcome: Adequate for Discharge   Problem: Safety: Goal: Ability to redirect hostility and anger into socially appropriate behaviors will improve Outcome:  Adequate for Discharge Goal: Ability to remain free from injury will improve Outcome: Adequate for Discharge   Problem: Self-Care: Goal: Ability to participate in self-care as condition permits will improve Outcome: Adequate for Discharge   Problem: Self-Concept: Goal: Will verbalize positive feelings about self Outcome: Adequate for Discharge

## 2024-01-23 NOTE — Plan of Care (Signed)
   Problem: Education: Goal: Emotional status will improve Outcome: Progressing Goal: Mental status will improve Outcome: Progressing   Problem: Activity: Goal: Interest or engagement in activities will improve Outcome: Progressing

## 2024-01-23 NOTE — Discharge Summary (Signed)
 Physician Discharge Summary Note  Patient:  Rodney Thomas is an 33 y.o., male MRN:  [id number] DOB:  03-02-1991 Patient phone:  [phone] (home)  Patient address:   [address] [place] [address],  Total Time spent with patient: 45 minutes  Date of Admission:  01/15/2024 Date of Discharge: 01/23/2024  Reason for Admission:   33 year old African-American male, single, unemployed, lives with his mother.  Known history of schizoaffective disorder bipolar type.  Hospitalized on account of worsening psychosis and mania.  Preoccupied with delusional belief.  Reports auditory hallucinations commanding him to end his life.  Recently incarcerated on account of communicating threat.  He has a pending legal charge. Routine labs are essentially normal.  No alcohol or any psychoactive substance on board.  Principal Problem: Schizoaffective disorder, bipolar type [facility]) Discharge Diagnoses: Principal Problem:   Schizoaffective disorder, bipolar type (HCC) Active Problems:   Paranoid (HCC)   Chronic insomnia   Obsessive-compulsive behavior   Vitamin D deficiency   Past Psychiatric History:  As per old notes. He  has a past medical history of Chronic insomnia (01/16/2024), Obsessive-compulsive behavior (01/16/2024), Schizoaffective disorder, bipolar type (HCC) (01/16/2024), and Seasonal allergies.  The patient tells me that he has presented to the ED numerous times with complaints of anxiety.  He states that he has been to "all the hospitals in [facility]".  He also states that he has been to multiple hospitals in [place] as well as in another city.  He states that he has never seen a psychiatrist or therapist outside of the hospital, and states that he is never taken psychiatric medicine.  He tells me that he has had suicidal ideation, but is not able to give me a clear history of attempted suicide.   Past Medical History:  Past Medical History:  Diagnosis Date   Chronic  insomnia 01/16/2024   Obsessive-compulsive behavior 01/16/2024   Schizoaffective disorder, bipolar type (HCC) 01/16/2024   Seasonal allergies    Vitamin D deficiency 01/17/2024    Past Surgical History:  Procedure Laterality Date   APPENDECTOMY     ORIF RADIAL FRACTURE Left 03/17/2014   Procedure: OPEN REDUCTION INTERNAL FIXATION (ORIF) RADIAL FRACTURE;  Surgeon: Eldred Manges, MD;  Location: [facility] OR;  Service: Orthopedics;  Laterality: Left;  Open Reduction Internal Fixation Left Radial Shaft Fracture   Family History: History reviewed. No pertinent family history. Family Psychiatric  History:  As per old notes. Family Psychiatric & Medical History: He reports that his mother has depression and that his brother has OCD like behavior, and is a "raging alcoholic".   Social History:  Social History   Substance and Sexual Activity  Alcohol Use No     Social History   Substance and Sexual Activity  Drug Use No    Social History   Socioeconomic History   Marital status: Single    Spouse name: Not on file   Number of children: Not on file   Years of education: Not on file   Highest education level: Not on file  Occupational History   Not on file  Tobacco Use   Smoking status: Never   Smokeless tobacco: Never  Substance and Sexual Activity   Alcohol use: No   Drug use: No   Sexual activity: Not on file  Other Topics Concern   Not on file  Social History Narrative   Not on file   Social Drivers of Health   Financial Resource Strain: Not on file  Food Insecurity:  No Food Insecurity (01/22/2024)   Hunger Vital Sign    Worried About Running Out of Food in the Last Year: Never true    Ran Out of Food in the Last Year: Never true  Transportation Needs: No Transportation Needs (01/22/2024)   PRAPARE - Administrator, Civil Service (Medical): No    Lack of Transportation (Non-Medical): No  Physical Activity: Not on file  Stress: Not on file  Social Connections:  Not on file    Hospital Course:   Patient was admitted on suicide precautions.  He was started on risperidone and Depakote.  His doses were gradually titrated.  Patient tolerated the titration well and responded well to treatment.  Insomnia resolved.  Sleep-wake cycle became well-regulated.  Overwhelming anxiety resolved as the medication kicked him.  Persecutory and paranoid delusions gradually reduced and transitioned into ideas of reference.  Patient has insight into his residual symptoms.  He has been able to interact with other people.  He did not exhibit any aggressive behavior towards his peers.  He did not require any restraints or emergency psychiatric medications during his hospital stay.  Patient experienced abdominal pain which was severe to the point that he had to go to the emergency room.  He was treated with antibiotics and responded well to treatment.  His mother has been supportive throughout his hospital stay.  At interview today.  Patient tells me that he feels good.  He feels ready to go back home.  He is not endorsing any hallucinations.  He is not endorsing any delusions.  He is excited about the prospects of feeling free to interact at home as he has been very reclusive in the past.  He is not endorsing any rageful thoughts towards himself or anyone else.  He is tolerating his medicines without any adverse effects.  Nursing staff reports that he has been appropriate on the unit.  No challenging behavior.  He slept well last night.  No observed response to internal stimuli.  Team agrees that patient is back to his baseline.  Team agrees with discharge today.   Physical Findings: AIMS:  , ,  ,  ,    CIWA:    COWS:     Musculoskeletal: Strength & Muscle Tone: within normal limits Gait & Station: normal Patient leans: N/A   Psychiatric Specialty Exam:  Presentation  General Appearance:  Slim build, casually dressed, engage politely.  No EPS.   Eye Contact: Good.    Speech: Spontaneous, normal rate, tone and volume.   Mood and Affect  Mood: Euthymic.   Affect: Full range and mood congruent.   Thought Process  Thought Processes: Normal speed of thought.  Goal directed.   Descriptions of Associations:Intact   Orientation:Full (Time, Place and Person)   Thought Content: No delusional theme.  No negative rumination.  No guilty ruminations.  Patient is future oriented.  No suicidal thoughts.  No homicidal thoughts.  No thoughts of violence.  No obsessions.   Hallucinations: No hallucination in any modality.   Sensorium  Memory: Good.   Judgment: Good.   Insight: Good.   Executive Functions  Concentration: Good   Attention Span: Good   Recall: Good   Fund of Knowledge: Good   Language: Good     Psychomotor Activity  Normalizing psychomotor activity   Physical Exam: Physical Exam ROS Blood pressure 122/76, pulse 93, temperature 97.7 F (36.5 C), temperature source Oral, resp. rate 16, height 5\' 10"  (1.778 m), weight  72.6 kg, SpO2 100%. Body mass index is 22.96 kg/m.   Social History   Tobacco Use  Smoking Status Never  Smokeless Tobacco Never   Tobacco Cessation:  N/A, patient does not currently use tobacco products   Blood Alcohol level:  Lab Results  Component Value Date   ETH <10 01/14/2024    Metabolic Disorder Labs:  Lab Results  Component Value Date   HGBA1C 5.4 01/16/2024   MPG 108.28 01/16/2024   No results found for: "PROLACTIN" Lab Results  Component Value Date   CHOL 164 01/16/2024   TRIG 177 (H) 01/16/2024   HDL 57 01/16/2024   CHOLHDL 2.9 01/16/2024   VLDL 35 01/16/2024   LDLCALC 72 01/16/2024    See Psychiatric Specialty Exam and Suicide Risk Assessment completed by Attending Physician prior to discharge.  Discharge destination:  Home  Is patient on multiple antipsychotic therapies at discharge:  No   Has Patient had three or more failed trials of antipsychotic  monotherapy by history:  No  Recommended Plan for Multiple Antipsychotic Therapies: NA  Discharge Instructions     Diet - low sodium heart healthy   Complete by: As directed    Increase activity slowly   Complete by: As directed       Allergies as of 01/23/2024   No Known Allergies      Medication List     STOP taking these medications    predniSONE 50 MG tablet Commonly known as: DELTASONE   traMADol 50 MG tablet Commonly known as: ULTRAM       TAKE these medications      Indication  amoxicillin-clavulanate 875-125 MG tablet Commonly known as: AUGMENTIN Take 1 tablet by mouth every 12 (twelve) hours for 4 days.  Indication: Diverticulitis of the Gastrointestinal Tract   divalproex 500 MG 24 hr tablet Commonly known as: DEPAKOTE ER Take 4 tablets (2,000 mg total) by mouth at bedtime.  Indication: MIXED BIPOLAR AFFECTIVE DISORDER   hyoscyamine 0.125 MG SL tablet Commonly known as: Levsin/SL Place 1 tablet (0.125 mg total) under the tongue every 4 (four) hours as needed.    ondansetron 4 MG disintegrating tablet Commonly known as: ZOFRAN-ODT Take 1 tablet (4 mg total) by mouth every 8 (eight) hours as needed for nausea or vomiting.    risperiDONE 3 MG tablet Commonly known as: RISPERDAL Take 2 tablets (6 mg total) by mouth at bedtime.  Indication: Schizophrenia   traZODone 50 MG tablet Commonly known as: DESYREL Take 1 tablet (50 mg total) by mouth at bedtime.  Indication: Trouble Sleeping        Follow-up Information     Willis Modena, MD. Schedule an appointment as soon as possible for a visit .   Specialty: Gastroenterology Contact information: [address] [place] [address] [phone]         [facility] Go on 02/01/2024.   Why: You have a hospital follow up appointment, to obtain therapy and medication management services on 02/01/24 at 2:30 pm. Contact information: [address] [place]  [place] [address] [phone]                 Follow-up recommendations:   Patient will stay on his current regimen and follow up as recommended.  No restrictions with respect to diet or activity.  Signed: Georgiann Cocker, MD 01/23/2024, 10:00 AM

## 2024-02-01 ENCOUNTER — Other Ambulatory Visit: Payer: Self-pay

## 2024-02-02 ENCOUNTER — Other Ambulatory Visit: Payer: Self-pay

## 2024-02-03 ENCOUNTER — Other Ambulatory Visit: Payer: Self-pay

## 2024-02-03 ENCOUNTER — Encounter (HOSPITAL_COMMUNITY): Payer: Self-pay | Admitting: Emergency Medicine

## 2024-02-03 ENCOUNTER — Emergency Department (HOSPITAL_COMMUNITY)
Admission: EM | Admit: 2024-02-03 | Discharge: 2024-02-03 | Disposition: A | Discharge status: Home / Self Care | Payer: Self-pay | Attending: Emergency Medicine | Admitting: Emergency Medicine

## 2024-02-03 DIAGNOSIS — D696 Thrombocytopenia, unspecified: Secondary | ICD-10-CM | POA: Insufficient documentation

## 2024-02-03 DIAGNOSIS — R0789 Other chest pain: Secondary | ICD-10-CM

## 2024-02-03 DIAGNOSIS — F419 Anxiety disorder, unspecified: Secondary | ICD-10-CM | POA: Insufficient documentation

## 2024-02-03 DIAGNOSIS — R079 Chest pain, unspecified: Secondary | ICD-10-CM | POA: Insufficient documentation

## 2024-02-03 LAB — CBC WITH DIFFERENTIAL/PLATELET
Abs Immature Granulocytes: 0.1 10*3/uL — ABNORMAL HIGH (ref 0.00–0.07)
Basophils Absolute: 0.1 10*3/uL (ref 0.0–0.1)
Basophils Relative: 1 %
Eosinophils Absolute: 0.5 10*3/uL (ref 0.0–0.5)
Eosinophils Relative: 6 %
HCT: 41.9 % (ref 39.0–52.0)
Hemoglobin: 13.7 g/dL (ref 13.0–17.0)
Immature Granulocytes: 1 %
Lymphocytes Relative: 30 %
Lymphs Abs: 2.6 10*3/uL (ref 0.7–4.0)
MCH: 28 pg (ref 26.0–34.0)
MCHC: 32.7 g/dL (ref 30.0–36.0)
MCV: 85.7 fL (ref 80.0–100.0)
Monocytes Absolute: 0.8 10*3/uL (ref 0.1–1.0)
Monocytes Relative: 9 %
Neutro Abs: 4.7 10*3/uL (ref 1.7–7.7)
Neutrophils Relative %: 53 %
Platelets: 149 10*3/uL — ABNORMAL LOW (ref 150–400)
RBC: 4.89 MIL/uL (ref 4.22–5.81)
RDW: 15.9 % — ABNORMAL HIGH (ref 11.5–15.5)
WBC: 8.8 10*3/uL (ref 4.0–10.5)
nRBC: 0 % (ref 0.0–0.2)

## 2024-02-03 LAB — BASIC METABOLIC PANEL
Anion gap: 10 (ref 5–15)
BUN: 19 mg/dL (ref 6–20)
CO2: 24 mmol/L (ref 22–32)
Calcium: 8.9 mg/dL (ref 8.9–10.3)
Chloride: 104 mmol/L (ref 98–111)
Creatinine, Ser: 1.09 mg/dL (ref 0.61–1.24)
GFR, Estimated: >60 mL/min (ref >60)
Glucose, Bld: 117 mg/dL — ABNORMAL HIGH (ref 70–99)
Potassium: 3.9 mmol/L (ref 3.5–5.1)
Sodium: 138 mmol/L (ref 135–145)

## 2024-02-03 LAB — TROPONIN I (HIGH SENSITIVITY): Troponin I (High Sensitivity): 3 ng/L (ref <18)

## 2024-02-03 NOTE — Discharge Instructions (Addendum)
 Your blood work was reassuring.  No concerning findings on your workup.  You are able to stay in the emergency department until the effects of the trazodone wore off.  Follow-up with your primary care provider.  Return for any emergent symptoms.

## 2024-02-03 NOTE — ED Triage Notes (Signed)
 BIBA Per EMS: Pt coming from home w/ c/o being up x 48 hours. Hx schizophrenia. Took trazodone for the 1st time today. States it made him anxious. VSS

## 2024-02-03 NOTE — ED Provider Notes (Signed)
 Patient received in signout.  Pending metabolization to freedom from trazodone. Physical Exam  BP 128/78   Pulse 78   Temp 97.8 F (36.6 C)   Resp 18   SpO2 100%     Procedures  Procedures  ED Course / MDM   Clinical Course as of 02/03/24 1320  Wed Feb 03, 2024  0454 mtf [AA]    Clinical Course User Index [AA] Marita Kansas, PA-C   Medical Decision Making Amount and/or Complexity of Data Reviewed Labs: ordered.   On reevaluation patient is alert and oriented and is ambulating without difficulty.  Vital signs are stable.  He is stable for discharge.    Marita [place], PA-C 02/03/24 1321    Elayne Snare K, DO 02/03/24 1321

## 2024-02-03 NOTE — ED Provider Notes (Signed)
 [place] EMERGENCY DEPARTMENT AT [facility] Provider Note   CSN: [id number] Arrival date & time: 02/03/24  0209     History  Chief Complaint  Patient presents with   Medical Clearance    Rodney Thomas is a 33 y.o. male with history of schizophrenia bipolar type who presents with concern for anxiety and left-sided chest pressure after taking trazodone for the first time at bedtime..  Patient was recently admitted to [facility] and discharged on 01/23/2024 after admission for worsening psychosis and mania, present initiated during this hospitalization per patient.  Patient endorses that he was feeling anxious but is currently tired and only concerned with his chest pain.  States that medication makes him very sleepy.  Asking to lie down.    HPI     Home Medications Prior to Admission medications   Medication Sig Start Date End Date Taking? Authorizing Provider  divalproex (DEPAKOTE ER) 500 MG 24 hr tablet Take 4 tablets (2,000 mg total) by mouth at bedtime. 01/23/24   [name], Delight Ovens, MD  hyoscyamine (LEVSIN/SL) 0.125 MG SL tablet Place 1 tablet (0.125 mg total) under the tongue every 4 (four) hours as needed. 01/20/24   Durwin Glaze, MD  ondansetron (ZOFRAN-ODT) 4 MG disintegrating tablet Take 1 tablet (4 mg total) by mouth every 8 (eight) hours as needed for nausea or vomiting. 01/20/24   Durwin Glaze, MD  risperiDONE (RISPERDAL) 3 MG tablet Take 2 tablets (6 mg total) by mouth at bedtime. 01/23/24   Georgiann Cocker, MD  traZODone (DESYREL) 50 MG tablet Take 1 tablet (50 mg total) by mouth at bedtime. 01/23/24   Georgiann Cocker, MD      Allergies    Patient has no known allergies.    Review of Systems   Review of Systems  Cardiovascular:  Positive for chest pain.  Psychiatric/Behavioral:  The patient is nervous/anxious and is hyperactive.     Physical Exam Updated Vital Signs BP 117/78 (BP Location: Right Arm)   Pulse 98   Temp 97.6 F (36.4 C) (Oral)    Resp 16   SpO2 100%  Physical Exam Vitals and nursing note reviewed.  Constitutional:      Appearance: He is not ill-appearing or toxic-appearing.  HENT:     Head: Normocephalic and atraumatic.     Mouth/Throat:     Mouth: Mucous membranes are moist.     Pharynx: No oropharyngeal exudate or posterior oropharyngeal erythema.  Eyes:     General:        Right eye: No discharge.        Left eye: No discharge.     Conjunctiva/sclera: Conjunctivae normal.  Cardiovascular:     Rate and Rhythm: Normal rate and regular rhythm.     Pulses: Normal pulses.     Heart sounds: Normal heart sounds. No murmur heard. Pulmonary:     Effort: Pulmonary effort is normal. No respiratory distress.     Breath sounds: Normal breath sounds. No wheezing or rales.  Abdominal:     General: Bowel sounds are normal. There is no distension.     Palpations: Abdomen is soft.     Tenderness: There is no abdominal tenderness. There is no guarding or rebound.  Musculoskeletal:        General: No deformity.     Cervical back: Neck supple.     Right lower leg: No edema.     Left lower leg: No edema.  Skin:  General: Skin is warm and dry.     Capillary Refill: Capillary refill takes less than 2 seconds.  Neurological:     General: No focal deficit present.     Mental Status: He is alert and oriented to person, place, and time. Mental status is at baseline.  Psychiatric:        Mood and Affect: Mood normal.        Behavior: Behavior is cooperative.        Thought Content: Thought content does not include homicidal or suicidal ideation.     Comments: Does not appear to be responding to internal stimuli at this time.     ED Results / Procedures / Treatments   Labs (all labs ordered are listed, but only abnormal results are displayed) Labs Reviewed  CBC WITH DIFFERENTIAL/PLATELET - Abnormal; Notable for the following components:      Result Value   RDW 15.9 (*)    Platelets 149 (*)    Abs Immature  Granulocytes 0.10 (*)    All other components within normal limits  BASIC METABOLIC PANEL - Abnormal; Notable for the following components:   Glucose, Bld 117 (*)    All other components within normal limits  TROPONIN I (HIGH SENSITIVITY)    EKG EKG Interpretation Date/Time:  Wednesday February 03 2024 04:40:00 EDT Ventricular Rate:  69 PR Interval:  119 QRS Duration:  86 QT Interval:  356 QTC Calculation: 382 R Axis:   78  Text Interpretation: Sinus rhythm Borderline short PR interval ST elev, probable normal early repol pattern Confirmed by Ross Marcus ([id number]) on 02/03/2024 5:27:38 AM  Radiology No results found.  Procedures Procedures    Medications Ordered in ED Medications - No data to display  ED Course/ Medical Decision Making/ A&P                                 Medical Decision Making 33 year old male who presents with concern for grogginess and left chest pain after first dose of trazodone at home.  Patient anxious about this.  Does not be responding to internal stimuli.  Reassuring on intake.  Cardiopulmonary abdominal exams are benign.  Patient without evidence of acute psychiatric emergency, denies SI HI and is not currently responding to internal stimuli.  Patient is very groggy, having difficulty keeping his eyes open, presents with his medications at the bedside with appropriate number of pills in the bottles.  Amount and/or Complexity of Data Reviewed Labs: ordered.    Details: CBC with mild thrombocytopenia 149, BMP unremarkable, troponin is normal. ECG/medicine tests:     Details:   EKG with normal sinus rhythm.   Patient will require further time in the emergency department to metabolize his trazodone into rest, however workup is largely reassuring.  Clinical concern for emergent or cardiac underlying condition that would warrant further ED workup or inpatient management is exceedingly low.  Wells  voiced understanding of his medical evaluation and  treatment plan. Each of their questions answered to their expressed satisfaction.  Return precautions were given.  Patient is well-appearing, stable, and was discharged in good condition.   This chart was dictated using voice recognition software, Dragon. Despite the best efforts of this provider to proofread and correct errors, errors may still occur which can change documentation meaning.         Final Clinical Impression(s) / ED Diagnoses Final diagnoses:  None    Rx /  DC Orders ED Discharge Orders     None         Sherrilee Gilles 02/03/24 1610    Shon Baton, MD 02/05/24 [id number]

## 2024-02-07 ENCOUNTER — Emergency Department (HOSPITAL_COMMUNITY)
Admission: EM | Admit: 2024-02-07 | Discharge: 2024-02-07 | Disposition: A | Discharge status: Home / Self Care | Payer: Self-pay | Attending: Emergency Medicine | Admitting: Emergency Medicine

## 2024-02-07 ENCOUNTER — Other Ambulatory Visit: Payer: Self-pay

## 2024-02-07 ENCOUNTER — Encounter (HOSPITAL_COMMUNITY): Payer: Self-pay | Admitting: *Deleted

## 2024-02-07 DIAGNOSIS — R079 Chest pain, unspecified: Secondary | ICD-10-CM | POA: Insufficient documentation

## 2024-02-07 DIAGNOSIS — R1084 Generalized abdominal pain: Secondary | ICD-10-CM | POA: Insufficient documentation

## 2024-02-07 LAB — CBC
HCT: 42.2 % (ref 39.0–52.0)
Hemoglobin: 14 g/dL (ref 13.0–17.0)
MCH: 28.3 pg (ref 26.0–34.0)
MCHC: 33.2 g/dL (ref 30.0–36.0)
MCV: 85.4 fL (ref 80.0–100.0)
Platelets: 196 10*3/uL (ref 150–400)
RBC: 4.94 MIL/uL (ref 4.22–5.81)
RDW: 15.7 % — ABNORMAL HIGH (ref 11.5–15.5)
WBC: 6.3 10*3/uL (ref 4.0–10.5)
nRBC: 0 % (ref 0.0–0.2)

## 2024-02-07 LAB — URINALYSIS, ROUTINE W REFLEX MICROSCOPIC
Bilirubin Urine: NEGATIVE
Glucose, UA: NEGATIVE mg/dL
Hgb urine dipstick: NEGATIVE
Ketones, ur: NEGATIVE mg/dL
Leukocytes,Ua: NEGATIVE
Nitrite: NEGATIVE
Protein, ur: NEGATIVE mg/dL
Specific Gravity, Urine: 1.017 (ref 1.005–1.030)
pH: 7 (ref 5.0–8.0)

## 2024-02-07 LAB — COMPREHENSIVE METABOLIC PANEL
ALT: 45 U/L — ABNORMAL HIGH (ref 0–44)
AST: 28 U/L (ref 15–41)
Albumin: 3.7 g/dL (ref 3.5–5.0)
Alkaline Phosphatase: 47 U/L (ref 38–126)
Anion gap: 9 (ref 5–15)
BUN: 16 mg/dL (ref 6–20)
CO2: 25 mmol/L (ref 22–32)
Calcium: 9 mg/dL (ref 8.9–10.3)
Chloride: 105 mmol/L (ref 98–111)
Creatinine, Ser: 1.34 mg/dL — ABNORMAL HIGH (ref 0.61–1.24)
GFR, Estimated: >60 mL/min (ref >60)
Glucose, Bld: 95 mg/dL (ref 70–99)
Potassium: 4.4 mmol/L (ref 3.5–5.1)
Sodium: 139 mmol/L (ref 135–145)
Total Bilirubin: 0.6 mg/dL (ref 0.0–1.2)
Total Protein: 6.4 g/dL — ABNORMAL LOW (ref 6.5–8.1)

## 2024-02-07 LAB — RAPID URINE DRUG SCREEN, HOSP PERFORMED
Amphetamines: NOT DETECTED
Barbiturates: NOT DETECTED
Benzodiazepines: NOT DETECTED
Cocaine: NOT DETECTED
Opiates: NOT DETECTED
Tetrahydrocannabinol: NOT DETECTED

## 2024-02-07 LAB — TROPONIN I (HIGH SENSITIVITY): Troponin I (High Sensitivity): 4 ng/L (ref <18)

## 2024-02-07 LAB — LIPASE, BLOOD: Lipase: 28 U/L (ref 11–51)

## 2024-02-07 MED ORDER — ONDANSETRON HCL 4 MG/2ML IJ SOLN: 4.0000 mg | Freq: Once | INTRAMUSCULAR | Status: DC

## 2024-02-07 MED ORDER — SODIUM CHLORIDE 0.9 % IV BOLUS: 1000.0000 mL | Freq: Once | INTRAVENOUS | Status: DC

## 2024-02-07 NOTE — ED Provider Notes (Signed)
  EMERGENCY DEPARTMENT AT [facility] Provider Note   CSN: [id number] Arrival date & time: 02/07/24  1525     History  Chief Complaint  Patient presents with   Abdominal Pain    Rodney Thomas is a 33 y.o. male with medical history of schizoaffective disorder, bipolar type, chronic insomnia, OCD, vitamin D deficiency, paranoia.  Patient presents to ED for evaluation of abdominal cramping.  The patient reports that yesterday he was in [place], he ate a [employer] alone.  Patient reports that 5 hours after ingesting food, began having abdominal cramping as well as 1 episode of chest pain that lasted 1 minute.  He denies nausea, vomiting, diarrhea.  Denies fevers, dysuria or flank pain.  Denies chest pain, shortness of breath during examination and interview.  He is here due to the concern that someone has poisoned his food.  He denies SI, HI, AVH.   Abdominal Pain Associated symptoms: chest pain   Associated symptoms: no diarrhea, no fever, no nausea, no shortness of breath and no vomiting        Home Medications Prior to Admission medications   Medication Sig Start Date End Date Taking? Authorizing Provider  divalproex (DEPAKOTE ER) 500 MG 24 hr tablet Take 4 tablets (2,000 mg total) by mouth at bedtime. 01/23/24   [name], Delight Ovens, MD  hyoscyamine (LEVSIN/SL) 0.125 MG SL tablet Place 1 tablet (0.125 mg total) under the tongue every 4 (four) hours as needed. 01/20/24   Durwin Glaze, MD  ondansetron (ZOFRAN-ODT) 4 MG disintegrating tablet Take 1 tablet (4 mg total) by mouth every 8 (eight) hours as needed for nausea or vomiting. 01/20/24   Durwin Glaze, MD  risperiDONE (RISPERDAL) 3 MG tablet Take 2 tablets (6 mg total) by mouth at bedtime. 01/23/24   Georgiann Cocker, MD  traZODone (DESYREL) 50 MG tablet Take 1 tablet (50 mg total) by mouth at bedtime. 01/23/24   Georgiann Cocker, MD      Allergies    Patient has no known allergies.    Review of  Systems   Review of Systems  Constitutional:  Negative for fever.  Respiratory:  Negative for shortness of breath.   Cardiovascular:  Positive for chest pain.  Gastrointestinal:  Positive for abdominal pain. Negative for diarrhea, nausea and vomiting.  Psychiatric/Behavioral:  Negative for hallucinations, self-injury, sleep disturbance and suicidal ideas.   All other systems reviewed and are negative.   Physical Exam Updated Vital Signs BP 112/73   Pulse 72   Temp 98.2 F (36.8 C)   Resp 17   Ht 5\' 10"  (1.778 m)   Wt 72.6 kg   SpO2 100%   BMI 22.97 kg/m  Physical Exam Vitals and nursing note reviewed.  Constitutional:      General: He is not in acute distress.    Appearance: Normal appearance. He is not ill-appearing, toxic-appearing or diaphoretic.  HENT:     Head: Normocephalic and atraumatic.     Nose: Nose normal.     Mouth/Throat:     Mouth: Mucous membranes are moist.     Pharynx: Oropharynx is clear.  Eyes:     Extraocular Movements: Extraocular movements intact.     Conjunctiva/sclera: Conjunctivae normal.     Pupils: Pupils are equal, round, and reactive to light.  Cardiovascular:     Rate and Rhythm: Normal rate and regular rhythm.  Pulmonary:     Effort: Pulmonary effort is normal.  Breath sounds: Normal breath sounds. No wheezing.  Abdominal:     General: Abdomen is flat. Bowel sounds are normal.     Palpations: Abdomen is soft.     Tenderness: There is no abdominal tenderness.  Musculoskeletal:     Cervical back: Normal range of motion and neck supple. No tenderness.  Skin:    General: Skin is warm and dry.     Capillary Refill: Capillary refill takes less than 2 seconds.  Neurological:     General: No focal deficit present.     Mental Status: He is alert and oriented to person, place, and time.     GCS: GCS eye subscore is 4. GCS verbal subscore is 5. GCS motor subscore is 6.     Cranial Nerves: Cranial nerves 2-12 are intact. No cranial nerve  deficit.     Sensory: Sensation is intact. No sensory deficit.     Motor: Motor function is intact. No weakness.     Coordination: Coordination is intact. Heel to Shin Test normal.     ED Results / Procedures / Treatments   Labs (all labs ordered are listed, but only abnormal results are displayed) Labs Reviewed  COMPREHENSIVE METABOLIC PANEL - Abnormal; Notable for the following components:      Result Value   Creatinine, Ser 1.34 (*)    Total Protein 6.4 (*)    ALT 45 (*)    All other components within normal limits  CBC - Abnormal; Notable for the following components:   RDW 15.7 (*)    All other components within normal limits  LIPASE, BLOOD  URINALYSIS, ROUTINE W REFLEX MICROSCOPIC  RAPID URINE DRUG SCREEN, HOSP PERFORMED  TROPONIN I (HIGH SENSITIVITY)    EKG None  Radiology No results found.  Procedures Procedures   Medications Ordered in ED Medications - No data to display   ED Course/ Medical Decision Making/ A&P  Medical Decision Making    33 year old male presents for evaluation of abdominal pain.  Please see HPI for further details.  On examination patient is afebrile and nontachycardic.  His lung sounds are clear bilaterally, he is not hypoxic.  His abdomen is soft and compressible throughout.  His neurological examinations at baseline.  He denies SI, HI, AVH.  He is not responding to internal stimuli.  He is acting appropriate.  He is here due to the concern that someone poisoned his food yesterday.  Will collect CBC, CMP, lipase, rapid urine drug screen, urinalysis.  Patient also complains of 1 episode of chest pain yesterday as well so will collect one-time troponin.  Patient CBC without leukocytosis or anemia.  Metabolic panel without electrolyte derangement, creatinine 1.34.  4 days ago, 1.09.  Patient was offered IV fluids however he defers on this.  He states he will hydrate himself as an outpatient.  Urinalysis unremarkable.  UDS negative for  all.  Troponin 4, no chest pain at this time, last episode of chest pain was yesterday so will not collect delta.  Lipase WNL.  No tenderness to patient abdomen so will not image.  Apparently the patient mother called the ED.  She requested that we speak to her regarding her son's care and mental condition.  I requested permission from the son to speak to his mother however the patient states that he does not wish for me to communicate with his mom regarding his medical care here today.  He is not responding to internal stimuli.  He denies SI, HI, AVH.  He reports that he is compliant on his psychiatric medications.  Patient was offered TTS consult and initially excepted.  After some time, the patient stated that he wished to leave.  Again, he is alert and oriented x 4.  He is able to make medical decisions for himself.  He has no legal guardian listed in his chart.  He denies SI, HI or AVH.  At this time, the patient will be discharged home.  He was advised to follow-up with the psychiatric team as well as his PCP.  He was encouraged to return to the ED with any new or worsening symptoms and he voiced understanding.  Stable to discharge home.   Final Clinical Impression(s) / ED Diagnoses Final diagnoses:  Generalized abdominal pain    Rx / DC Orders ED Discharge Orders     None         Clent Ridges 02/07/24 2008    Benjiman Core, MD 02/07/24 2144

## 2024-02-07 NOTE — ED Triage Notes (Signed)
 The pt has had pain since he ate at a [employer] yesterday and 5 hours later he started having abd pain no n v ord  he wants his blood checked to see if he has drugs someone placed in his food

## 2024-02-07 NOTE — Discharge Instructions (Signed)
 It was a pleasure taking part in your care.  As we discussed, your cough is reassuring.  Please follow-up with your psychiatric team as well as her PCP.  Please return to the ED with any new or worsening symptoms.  Please continue taking all prescribed medications.

## 2024-02-07 NOTE — ED Notes (Signed)
 Mother Rodney Thomas [phone] would like to speak to a doctor asap, she says he CANNOT return home and needs to return to a mental facility for his schizophrenia

## 2024-02-08 ENCOUNTER — Encounter (HOSPITAL_COMMUNITY): Payer: Self-pay | Admitting: Emergency Medicine

## 2024-02-08 ENCOUNTER — Emergency Department (HOSPITAL_COMMUNITY)
Admission: EM | Admit: 2024-02-08 | Discharge: 2024-02-09 | Disposition: A | Discharge status: Other Acute Inpatient Hospital | Payer: Self-pay | Attending: Emergency Medicine | Admitting: Emergency Medicine

## 2024-02-08 ENCOUNTER — Other Ambulatory Visit: Payer: Self-pay

## 2024-02-08 DIAGNOSIS — F22 Delusional disorders: Secondary | ICD-10-CM | POA: Diagnosis not present

## 2024-02-08 DIAGNOSIS — F250 Schizoaffective disorder, bipolar type: Secondary | ICD-10-CM | POA: Diagnosis present

## 2024-02-08 DIAGNOSIS — R45851 Suicidal ideations: Secondary | ICD-10-CM | POA: Insufficient documentation

## 2024-02-08 LAB — CBC
HCT: 46.6 % (ref 39.0–52.0)
Hemoglobin: 15 g/dL (ref 13.0–17.0)
MCH: 28.5 pg (ref 26.0–34.0)
MCHC: 32.2 g/dL (ref 30.0–36.0)
MCV: 88.4 fL (ref 80.0–100.0)
Platelets: 214 10*3/uL (ref 150–400)
RBC: 5.27 MIL/uL (ref 4.22–5.81)
RDW: 15.9 % — ABNORMAL HIGH (ref 11.5–15.5)
WBC: 6.2 10*3/uL (ref 4.0–10.5)
nRBC: 0 % (ref 0.0–0.2)

## 2024-02-08 LAB — COMPREHENSIVE METABOLIC PANEL
ALT: 42 U/L (ref 0–44)
AST: 27 U/L (ref 15–41)
Albumin: 4.4 g/dL (ref 3.5–5.0)
Alkaline Phosphatase: 50 U/L (ref 38–126)
Anion gap: 8 (ref 5–15)
BUN: 19 mg/dL (ref 6–20)
CO2: 25 mmol/L (ref 22–32)
Calcium: 9.4 mg/dL (ref 8.9–10.3)
Chloride: 106 mmol/L (ref 98–111)
Creatinine, Ser: 0.88 mg/dL (ref 0.61–1.24)
GFR, Estimated: >60 mL/min (ref >60)
Glucose, Bld: 95 mg/dL (ref 70–99)
Potassium: 4.5 mmol/L (ref 3.5–5.1)
Sodium: 139 mmol/L (ref 135–145)
Total Bilirubin: 0.6 mg/dL (ref 0.0–1.2)
Total Protein: 7.5 g/dL (ref 6.5–8.1)

## 2024-02-08 LAB — RAPID URINE DRUG SCREEN, HOSP PERFORMED
Amphetamines: NOT DETECTED
Barbiturates: NOT DETECTED
Benzodiazepines: NOT DETECTED
Cocaine: NOT DETECTED
Opiates: NOT DETECTED
Tetrahydrocannabinol: NOT DETECTED

## 2024-02-08 LAB — SALICYLATE LEVEL: Salicylate Lvl: <7 mg/dL — ABNORMAL LOW (ref 7.0–30.0)

## 2024-02-08 LAB — ETHANOL: Alcohol, Ethyl (B): <10 mg/dL (ref <10)

## 2024-02-08 LAB — ACETAMINOPHEN LEVEL: Acetaminophen (Tylenol), Serum: <10 ug/mL — ABNORMAL LOW (ref 10–30)

## 2024-02-08 MED ORDER — TRAZODONE HCL 50 MG PO TABS: 50.0000 mg | ORAL_TABLET | Freq: Every evening | ORAL | Status: DC | PRN

## 2024-02-08 MED ORDER — DIVALPROEX SODIUM ER 500 MG PO TB24: 1500.0000 mg | ORAL_TABLET | Freq: Every day | ORAL | Status: DC

## 2024-02-08 MED ORDER — DIVALPROEX SODIUM ER 500 MG PO TB24: 500.0000 mg | ORAL_TABLET | Freq: Every day | ORAL | Status: DC

## 2024-02-08 MED ORDER — FLUPHENAZINE HCL 5 MG PO TABS
5.0000 mg | ORAL_TABLET | Freq: Every day | ORAL | Status: DC
  Administered 2024-02-08 – 2024-02-09 (×2): 5 mg via ORAL
  Filled 2024-02-08 (×2): qty 1

## 2024-02-08 MED ORDER — RISPERIDONE 2 MG PO TABS
2.0000 mg | ORAL_TABLET | Freq: Two times a day (BID) | ORAL | Status: DC
  Administered 2024-02-09: 2 mg via ORAL
  Filled 2024-02-08: qty 1

## 2024-02-08 NOTE — ED Triage Notes (Signed)
 Patient wants to go to Behavioral Health due to suicidal thoughts. He has thought about drowning himself, but wants to get better for his daughter.

## 2024-02-08 NOTE — ED Provider Notes (Signed)
 [place] EMERGENCY DEPARTMENT AT [facility] Provider Note   CSN: [id number] Arrival date & time: 02/08/24  1415     History  Chief Complaint  Patient presents with   Suicidal    Rodney Thomas is a 33 y.o. male, history of schizoaffective disorder, OCD, who presents to the ED secondary to suicidal ideation.  He states that he has had paranoid thoughts, and that is making him want to kill himself.  He states he plans on drowning himself, or strangulating himself.  He denies kind of any homicidal ideation.  Reports that the paranoia, sometimes comes in the form of voices.  States that he is not on any drugs, and does not drink alcohol.  States he does not really want to kill himself, because he wants to live for his daughter, and would like to get help.  Has had inpatient hospitalizations before.  Denies any recent head trauma     Home Medications Prior to Admission medications   Medication Sig Start Date End Date Taking? Authorizing Provider  divalproex (DEPAKOTE ER) 500 MG 24 hr tablet Take 4 tablets (2,000 mg total) by mouth at bedtime. 01/23/24   [name], Delight Ovens, MD  hyoscyamine (LEVSIN/SL) 0.125 MG SL tablet Place 1 tablet (0.125 mg total) under the tongue every 4 (four) hours as needed. 01/20/24   Durwin Glaze, MD  ondansetron (ZOFRAN-ODT) 4 MG disintegrating tablet Take 1 tablet (4 mg total) by mouth every 8 (eight) hours as needed for nausea or vomiting. 01/20/24   Durwin Glaze, MD  risperiDONE (RISPERDAL) 3 MG tablet Take 2 tablets (6 mg total) by mouth at bedtime. 01/23/24   Georgiann Cocker, MD  traZODone (DESYREL) 50 MG tablet Take 1 tablet (50 mg total) by mouth at bedtime. 01/23/24   Georgiann Cocker, MD      Allergies    Patient has no known allergies.    Review of Systems   Review of Systems  Psychiatric/Behavioral:  Positive for hallucinations and suicidal ideas. Negative for agitation.     Physical Exam Updated Vital Signs BP 133/73 (BP  Location: Right Arm)   Pulse 87   Temp 98.3 F (36.8 C) (Oral)   Resp 16   SpO2 99%  Physical Exam Vitals and nursing note reviewed.  Constitutional:      General: He is not in acute distress.    Appearance: He is well-developed.  HENT:     Head: Normocephalic and atraumatic.  Eyes:     Conjunctiva/sclera: Conjunctivae normal.  Cardiovascular:     Rate and Rhythm: Normal rate and regular rhythm.     Heart sounds: No murmur heard. Pulmonary:     Effort: Pulmonary effort is normal. No respiratory distress.     Breath sounds: Normal breath sounds.  Abdominal:     Palpations: Abdomen is soft.     Tenderness: There is no abdominal tenderness.  Musculoskeletal:        General: No swelling.     Cervical back: Neck supple.  Skin:    General: Skin is warm and dry.     Capillary Refill: Capillary refill takes less than 2 seconds.  Neurological:     Mental Status: He is alert.  Psychiatric:        Mood and Affect: Mood normal.     ED Results / Procedures / Treatments   Labs (all labs ordered are listed, but only abnormal results are displayed) Labs Reviewed  SALICYLATE LEVEL - Abnormal; Notable  for the following components:      Result Value   Salicylate Lvl <7.0 (*)    All other components within normal limits  ACETAMINOPHEN LEVEL - Abnormal; Notable for the following components:   Acetaminophen (Tylenol), Serum <10 (*)    All other components within normal limits  CBC - Abnormal; Notable for the following components:   RDW 15.9 (*)    All other components within normal limits  COMPREHENSIVE METABOLIC PANEL  ETHANOL  RAPID URINE DRUG SCREEN, HOSP PERFORMED    EKG None  Radiology No results found.  Procedures Procedures    Medications Ordered in ED Medications  risperiDONE (RISPERDAL) tablet 2 mg (has no administration in time range)  fluPHENAZine (PROLIXIN) tablet 5 mg (has no administration in time range)  traZODone (DESYREL) tablet 50 mg (has no  administration in time range)  divalproex (DEPAKOTE ER) 24 hr tablet 1,500 mg (has no administration in time range)    ED Course/ Medical Decision Making/ A&P                                 Medical Decision Making Patient is a 34 year old male,, complaint of SI, with plan, to drown self.  History of schizoaffective disorder, has not had any suicide attempts in the past, but has had inpatient hospitalizations for psychiatric reasons before.  He is compliant, and would like to stay, thus at this time he is not IVC, but nursing has been informed, if he tries to leave, he will need to be IVC.  We will medically clear him.  He has no evidence of any current injuries, denies any recent head trauma.  Blood work ordered  Amount and/or Complexity of Data Reviewed Labs: ordered.    Details: Blood work unremarkable Discussion of management or test interpretation with external provider(s): Discussed with patient, blood work is unremarkable, he would like to see psychiatry.  Psychiatry consult placed, will be dispo, once psychiatry evaluates patient.   Final Clinical Impression(s) / ED Diagnoses Final diagnoses:  Suicidal ideation    Rx / DC Orders ED Discharge Orders     None         Savonna Birchmeier, Harley Alto, PA 02/08/24 1746    Royanne Foots, DO 02/09/24 1500

## 2024-02-08 NOTE — ED Notes (Signed)
 [facility] called pts mother, Elon Eoff for collateral information. Pts mother reports that pt has been acting increasingly paranoid, responding to internal stimuli and feeling anxious. Pt has not been sleeping well, pacing throughout the night. Per pts mother, pt has been using numbers obsessively, opening and shutting doors repeatedly, and expressing fear of others when in public.   Pts mother said that she feels pt needs to go back to [facility] for treatment and stabilization. Pts mother was advised to have pt follow up with outpatient treatment after discharge and to apply for Medicaid as soon as possible. Pts mother said that when pt has been stabilized he can return home to live with her. Pts mother would like to be notified of pts disposition.  Jacquelynn Cree, [facility]  02/08/24

## 2024-02-08 NOTE — ED Notes (Signed)
 Pt has been changed into scrubs.  Pt has been seen by security.  Pt has belongings in cabinet 9-12.

## 2024-02-08 NOTE — ED Notes (Addendum)
 Patient resting in bed. Patient medication compliant.

## 2024-02-08 NOTE — Consult Note (Signed)
 [facility] Psychiatric Consult Initial  Patient Name: .Rodney Thomas  MRN: [id number]  DOB: 1991/06/03  Consult Order details:  Orders (From admission, onward)     Start     Ordered   02/08/24 1651  CONSULT TO CALL ACT TEAM       Ordering Provider: Pete Pelt, PA  Provider:  (Not yet assigned)  Question:  Reason for Consult?  Answer:  SI   02/08/24 1650             Mode of Visit: In person    Psychiatry Consult Evaluation  Service Date: February 08, 2024 LOS:  LOS: 0 days  Chief Complaint "People are watching and following me.  It makes me anxious and suicidal"  Primary Psychiatric Diagnoses  Paranoid Disorder 2.  Schizoaffective disorder, Bipolar type.  Assessment  Rodney Thomas is a 33 y.o. male admitted: Presented to the EDfor 02/08/2024  2:42 PM for Paranoia. He carries the psychiatric diagnoses of Schizoaffective disorder, Bipolar type and has a past medical history of  none.   His current presentation of paranoia  is most consistent with Psychosis. He meets criteria for inpatient Psychiatry hospitalization based on the level of paranoia and consequences of feeling suicidal for that..  Current outpatient psychotropic medications include Risperidone, Depakote,  Trazodone and historically he has had a positive response to these medications. He was  compliant with medications prior to admission as evidenced by his report of no longer experiencing AVH since taking Risperidone.. On initial examination, patient presented anxious and fearful affect. Please see plan below for detailed recommendations.   Diagnoses:  Active Hospital problems: Principal Problem:   Paranoid [name]) Active Problems:   Schizoaffective disorder, bipolar type (HCC)    Plan   ## Psychiatric Medication Recommendations:  Resume all Psychotropic Medications. Decrease Risperidone and add Prolixin 5 mg daily.for Typical antipsychotic combination.  ## Medical Decision Making Capacity: Not  specifically addressed in this encounter  ## Further Work-up:   Depakote Level IN AM 0800 -- most recent EKG on 02/08/2024 had QtC of 395 -- Pertinent labwork reviewed earlier this admission includes: CMP, CBC, UDS-Negative   ## Disposition:-- We recommend inpatient psychiatric hospitalization when medically cleared. Patient is under voluntary admission status at this time; please IVC if attempts to leave hospital.  ## Behavioral / Environmental: - No specific recommendations at this time.     ## Safety and Observation Level:  - Based on my clinical evaluation, I estimate the patient to be at low risk of self harm in the current setting. - At this time, we recommend  routine. This decision is based on my review of the chart including patient's history and current presentation, interview of the patient, mental status examination, and consideration of suicide risk including evaluating suicidal ideation, plan, intent, suicidal or self-harm behaviors, risk factors, and protective factors. This judgment is based on our ability to directly address suicide risk, implement suicide prevention strategies, and develop a safety plan while the patient is in the clinical setting. Please contact our team if there is a concern that risk level has changed.  CSSR Risk Category:C-SSRS RISK CATEGORY: High Risk  Suicide Risk Assessment: Patient has following modifiable risk factors for suicide: active suicidal ideation and lack of access to outpatient mental health resources, which we are addressing by recommending inpatient Psychiatry hospitalization. Patient has following non-modifiable or demographic risk factors for suicide: male gender and psychiatric hospitalization Patient has the following protective factors against suicide: Supportive family and no history  of suicide attempts  Thank you for this consult request. Recommendations have been communicated to the primary team.  We will continue to seek  inpatient Psychiatry hospitalization and will continue to monitor patient until he gets a bed. at this time.   Rodney Navy, NP-PMHNP-BC       History of Present Illness  Relevant Aspects of Hospital ED Course:  Admitted on 02/08/2024 for "People are watching and following me.  It makes me anxious and suicidal".   Patient IS A 33 years old male who came to the ER asking to be admitted to [facility] for not getting better with his Medications.  Patient was seen this afternoon by this provider.  He presented anxious and fearful affect stating that he cannot sleep and he is worried and afraid people are monitoring and following him about.  Patient was discharged from [facility]  after treatment for Paranoia and Schizoaffective disorder.  Patient was seen yesterday at [facility] ER for abdominal pain.  Mother called the ER and informed Nursing staff that patient is not welcomed back home to her house until he gets treatment for Paranoia.   Patient was sent home on Depakote and Risperidone.  Patient states he has been taking his Medications as prescribed but added he is no longer hearing voices or seeing things.  Patient states that he is afraid to stay alone and he  keeps lights on in his mom's home due to believing that people are watching him, following him around.  Patient reports getting only two hours of sleep at night but sleeps 8-10 hours during the day when he feels safe.  Patient states he does not come out much because he is afraid the people looking for him are out there looking for him.  Patient states that he is usually not feeling suicidal but the thought of people following him around makes him suicidal with no plan. Male patient, 33 years old moderately groomed, neat and oriented  x 4, calm but anxious believes people are watching him, following him around.  He states his medications are effective since he does not hear voices or sees thing but is afraid of people looking for him.  Patient is  asking for Medication changes to treat the Paranoia.  He is currently taking Risperidone 6 mg at night time for mood/Psychosis.  At this dose Risperidone is already hight dose and is effective some but not totally effective.  Provider decreased Risperidone and added Typical Antipsychotic Prolixin to see if combination of both will treat his Paranoia.  He denies AVH but remains suicidal. We will seek inpatient Psychiatry hospitalization and will fax out records to hospitals seeking bed placement.   Psych ROS:  Depression: denies Anxiety:  yes Mania (lifetime and current): na Psychosis: (lifetime and current): yes-Paranoia  Collateral information:  Contacted Mother  by Jacquelynn Cree on 02/08/2024.  See note by [facility] for today.  Review of Systems  Constitutional: Negative.   HENT: Negative.    Eyes: Negative.   Respiratory: Negative.    Cardiovascular: Negative.   Gastrointestinal: Negative.   Genitourinary: Negative.   Musculoskeletal: Negative.   Skin: Negative.   Neurological: Negative.   Endo/Heme/Allergies: Negative.   Psychiatric/Behavioral:  Positive for suicidal ideas. The patient is nervous/anxious and has insomnia.      Psychiatric and Social History  Psychiatric History:  Information collected from patient/Review of Chart  Prev Dx/Sx: see above Current Psych Provider: none, admits no follow up with outpatient Psychiatrist since  discharge Home Meds (current): see above Previous Med Trials: none Therapy: denies  Prior Psych Hospitalization: yes, [facility] 2025  Prior Self Harm: Denies Prior Violence: Denies  Family Psych History: denies Family Hx suicide: denies  Social History:  Developmental Hx: wnl Educational Hx: HS Occupational Hx: Unemployed Legal Hx: Denies Living Situation: with mom  Spiritual Hx: denies Access to weapons/lethal means: denies   Substance History:  Patient denies any use of illicit drugs, no Alcohol use as well.  UDS is Negative today.   He denies tobacco use as well.   Exam Findings  Physical Exam:  Vital Signs:  Temp:  [98.2 F (36.8 C)-98.3 F (36.8 C)] 98.3 F (36.8 C) (03/17 1447) Pulse Rate:  [72-87] 87 (03/17 1447) Resp:  [16-17] 16 (03/17 1447) BP: (112-133)/(73) 133/73 (03/17 1447) SpO2:  [99 %-100 %] 99 % (03/17 1447) Blood pressure 133/73, pulse 87, temperature 98.3 F (36.8 C), temperature source Oral, resp. rate 16, SpO2 99%. There is no height or weight on file to calculate BMI.  Physical Exam Vitals and nursing note reviewed.  Constitutional:      Appearance: Normal appearance.  HENT:     Nose: Nose normal.  Cardiovascular:     Rate and Rhythm: Normal rate and regular rhythm.  Pulmonary:     Effort: Pulmonary effort is normal.  Musculoskeletal:        General: Normal range of motion.  Skin:    General: Skin is dry.  Neurological:     Mental Status: He is alert and oriented to person, place, and time.  Psychiatric:        Attention and Perception: Attention and perception normal.        Mood and Affect: Mood is anxious.        Speech: Speech normal.        Behavior: Behavior normal. Behavior is cooperative.        Thought Content: Thought content is paranoid and delusional. Thought content includes suicidal ideation.        Judgment: Judgment is inappropriate.     Mental Status Exam: General Appearance: Casual  Orientation:  Full (Time, Place, and Person)  Memory:  Immediate;   Good Recent;   Good Remote;   Good  Concentration:  Concentration: Fair and Attention Span: Fair  Recall:  Good  Attention  Fair  Eye Contact:  Good  Speech:  Clear and Coherent and Normal Rate  Language:  Good  Volume:  Normal  Mood: "Anxious, fearful,"  Affect:  Congruent  Thought Process:  Coherent  Thought Content:  Delusions and Paranoid Ideation  Suicidal Thoughts:  Yes.  without intent/plan  Homicidal Thoughts:  No  Judgement:  Poor  Insight:  Present  Psychomotor Activity:  Normal   Akathisia:  NA  Fund of Knowledge:  Fair      Assets:  Manufacturing systems engineer Desire for Improvement Housing Physical Health  Cognition:  WNL  ADL's:  Intact  AIMS (if indicated):        Other History   These have been pulled in through the EMR, reviewed, and updated if appropriate.  Family History:  The patient's family history is not on file.  Medical History: Past Medical History:  Diagnosis Date   Chronic insomnia 01/16/2024   Obsessive-compulsive behavior 01/16/2024   Schizoaffective disorder, bipolar type (HCC) 01/16/2024   Seasonal allergies    Vitamin D deficiency 01/17/2024    Surgical History: Past Surgical History:  Procedure Laterality Date   APPENDECTOMY  ORIF RADIAL FRACTURE Left 03/17/2014   Procedure: OPEN REDUCTION INTERNAL FIXATION (ORIF) RADIAL FRACTURE;  Surgeon: Eldred Manges, MD;  Location: [facility] OR;  Service: Orthopedics;  Laterality: Left;  Open Reduction Internal Fixation Left Radial Shaft Fracture     Medications:   Current Facility-Administered Medications:    divalproex (DEPAKOTE ER) 24 hr tablet 1,500 mg, 1,500 mg, Oral, QHS, Netanel Yannuzzi C, NP   fluPHENAZine (PROLIXIN) tablet 5 mg, 5 mg, Oral, Daily, Wash Nienhaus C, NP   risperiDONE (RISPERDAL) tablet 2 mg, 2 mg, Oral, BID, Cellie Dardis C, NP   traZODone (DESYREL) tablet 50 mg, 50 mg, Oral, QHS PRN, Welford Roche, Dalan Cowger C, NP  Current Outpatient Medications:    divalproex (DEPAKOTE ER) 500 MG 24 hr tablet, Take 4 tablets (2,000 mg total) by mouth at bedtime., Disp: 120 tablet, Rfl: 0   hyoscyamine (LEVSIN/SL) 0.125 MG SL tablet, Place 1 tablet (0.125 mg total) under the tongue every 4 (four) hours as needed., Disp: 30 tablet, Rfl: 0   ondansetron (ZOFRAN-ODT) 4 MG disintegrating tablet, Take 1 tablet (4 mg total) by mouth every 8 (eight) hours as needed for nausea or vomiting., Disp: 20 tablet, Rfl: 0   risperiDONE (RISPERDAL) 3 MG tablet, Take 2 tablets (6 mg total) by mouth at  bedtime., Disp: 60 tablet, Rfl: 0   traZODone (DESYREL) 50 MG tablet, Take 1 tablet (50 mg total) by mouth at bedtime., Disp: 30 tablet, Rfl: 0  Allergies: No Known Allergies  Rodney Navy, NP

## 2024-02-08 NOTE — ED Notes (Signed)
 Two belongings bags placed in locker 34

## 2024-02-08 NOTE — ED Notes (Signed)
 Patient to room 34. Patient ambulated to room. Patient  oriented to unit and room.  Patient sitting up in the bed.

## 2024-02-09 ENCOUNTER — Other Ambulatory Visit (HOSPITAL_COMMUNITY)
Admission: EM | Admit: 2024-02-09 | Discharge: 2024-02-11 | Disposition: A | Discharge status: Other Acute Inpatient Hospital | Source: Home / Self Care | Attending: Psychiatry | Admitting: Psychiatry

## 2024-02-09 DIAGNOSIS — F22 Delusional disorders: Secondary | ICD-10-CM | POA: Diagnosis present

## 2024-02-09 DIAGNOSIS — F5104 Psychophysiologic insomnia: Secondary | ICD-10-CM | POA: Insufficient documentation

## 2024-02-09 DIAGNOSIS — Z560 Unemployment, unspecified: Secondary | ICD-10-CM | POA: Insufficient documentation

## 2024-02-09 DIAGNOSIS — Z91148 Patient's other noncompliance with medication regimen for other reason: Secondary | ICD-10-CM | POA: Insufficient documentation

## 2024-02-09 DIAGNOSIS — F250 Schizoaffective disorder, bipolar type: Secondary | ICD-10-CM | POA: Insufficient documentation

## 2024-02-09 DIAGNOSIS — R45851 Suicidal ideations: Secondary | ICD-10-CM | POA: Insufficient documentation

## 2024-02-09 LAB — VALPROIC ACID LEVEL: Valproic Acid Lvl: <10 ug/mL — ABNORMAL LOW (ref 50.0–100.0)

## 2024-02-09 MED ORDER — ACETAMINOPHEN 325 MG PO TABS
650.0000 mg | ORAL_TABLET | Freq: Four times a day (QID) | ORAL | Status: DC | PRN
Start: 2024-02-09 — End: 2024-02-11

## 2024-02-09 MED ORDER — HALOPERIDOL 5 MG PO TABS: 5.0000 mg | ORAL_TABLET | Freq: Three times a day (TID) | ORAL | Status: DC | PRN

## 2024-02-09 MED ORDER — LORAZEPAM 2 MG/ML IJ SOLN: 2.0000 mg | Freq: Three times a day (TID) | INTRAMUSCULAR | Status: DC | PRN

## 2024-02-09 MED ORDER — DIVALPROEX SODIUM ER 500 MG PO TB24
500.0000 mg | ORAL_TABLET | Freq: Every day | ORAL | Status: DC
  Administered 2024-02-09 – 2024-02-10 (×2): 500 mg via ORAL
  Filled 2024-02-09 (×2): qty 1

## 2024-02-09 MED ORDER — HALOPERIDOL LACTATE 5 MG/ML IJ SOLN
10.0000 mg | Freq: Three times a day (TID) | INTRAMUSCULAR | Status: DC | PRN
Start: 2024-02-09 — End: 2024-02-09

## 2024-02-09 MED ORDER — BUSPIRONE HCL 5 MG PO TABS
10.0000 mg | ORAL_TABLET | Freq: Three times a day (TID) | ORAL | Status: DC
  Administered 2024-02-09 – 2024-02-11 (×6): 10 mg via ORAL
  Filled 2024-02-09 (×7): qty 2

## 2024-02-09 MED ORDER — DIPHENHYDRAMINE HCL 50 MG/ML IJ SOLN
50.0000 mg | Freq: Three times a day (TID) | INTRAMUSCULAR | Status: DC | PRN
Start: 2024-02-09 — End: 2024-02-09

## 2024-02-09 MED ORDER — SERTRALINE HCL 50 MG PO TABS
50.0000 mg | ORAL_TABLET | Freq: Every day | ORAL | Status: DC
  Administered 2024-02-09 – 2024-02-11 (×3): 50 mg via ORAL
  Filled 2024-02-09 (×4): qty 1

## 2024-02-09 MED ORDER — HYDROXYZINE HCL 25 MG PO TABS: 25.0000 mg | ORAL_TABLET | Freq: Three times a day (TID) | ORAL | Status: DC | PRN

## 2024-02-09 MED ORDER — ALUM & MAG HYDROXIDE-SIMETH 200-200-20 MG/5ML PO SUSP: 30.0000 mL | ORAL | Status: DC | PRN

## 2024-02-09 MED ORDER — DIPHENHYDRAMINE HCL 50 MG/ML IJ SOLN: 50.0000 mg | Freq: Three times a day (TID) | INTRAMUSCULAR | Status: DC | PRN

## 2024-02-09 MED ORDER — BENZTROPINE MESYLATE 1 MG PO TABS
1.0000 mg | ORAL_TABLET | Freq: Every day | ORAL | Status: DC
  Administered 2024-02-09 – 2024-02-10 (×2): 1 mg via ORAL
  Filled 2024-02-09 (×2): qty 1

## 2024-02-09 MED ORDER — RISPERIDONE 3 MG PO TABS
6.0000 mg | ORAL_TABLET | Freq: Every day | ORAL | Status: DC
  Administered 2024-02-10: 6 mg via ORAL
  Filled 2024-02-09: qty 2

## 2024-02-09 MED ORDER — MAGNESIUM HYDROXIDE 400 MG/5ML PO SUSP: 30.0000 mL | Freq: Every day | ORAL | Status: DC | PRN

## 2024-02-09 MED ORDER — ACETAMINOPHEN 325 MG PO TABS: 650.0000 mg | ORAL_TABLET | Freq: Four times a day (QID) | ORAL | Status: DC | PRN

## 2024-02-09 MED ORDER — HALOPERIDOL LACTATE 5 MG/ML IJ SOLN: 5.0000 mg | Freq: Three times a day (TID) | INTRAMUSCULAR | Status: DC | PRN

## 2024-02-09 MED ORDER — DIPHENHYDRAMINE HCL 50 MG PO CAPS: 50.0000 mg | ORAL_CAPSULE | Freq: Three times a day (TID) | ORAL | Status: DC | PRN

## 2024-02-09 MED ORDER — TRAZODONE HCL 50 MG PO TABS
50.0000 mg | ORAL_TABLET | Freq: Every evening | ORAL | Status: DC | PRN
  Filled 2024-02-09: qty 1

## 2024-02-09 NOTE — ED Notes (Signed)
 Pt is in his room resting in bed. Pt denies SI/HI/AVH. Pt has no further complains at this moment. No acute distress noted. Will continue to monitor for safety.

## 2024-02-09 NOTE — ED Notes (Addendum)
 Pt transferred from [facility] to [facility] due to paranoid behaviors. Pt denies SI/HI/AVH. Pt states, "I'm tired of thinking that people are talking about me. I'm tired of feeling scared being around people. I'm not suicidal but I'm tired of feeling this way. That's what got me feeling suicidal at one time but now, I'm not suicidal. I just need my medicine so I can stop thinking this way". Encouragement and support provided.  Skin assessment completed. Oriented to unit. Meal and drink offered. At current, pt approached nurses station requesting to discharge due to pending court case coming up on Thursday. Pt states he don't want to miss the appt. I want to get this over with". Dr. Enedina Finner made aware of pt request. Dr. Enedina Finner states he will come to talk with pt. Pt made aware. Pt verbally contract for safety. Will monitor for safety.

## 2024-02-09 NOTE — Group Note (Signed)
 Group Topic: Wellness  Group Date: 02/09/2024 Start Time: 1645 End Time: 1715 Facilitators: Cassandria Anger  Department: [facility]  Number of Participants: 6  Group Focus: relaxation Treatment Modality:  Psychoeducation Interventions utilized were patient education Purpose: increase insight  Name: Rodney Thomas Date of Birth: March 03, 1991  MR: [id number]    Level of Participation: moderate Quality of Participation: attentive and cooperative Interactions with others: gave feedback Mood/Affect: appropriate and positive Triggers (if applicable): N/A Cognition: coherent/clear and insightful Progress: Gaining insight Response: Patient was asked to meditate and release all negative thoughts and energy from mind. Plan: patient will be encouraged to continue to attend group  Patients Problems:  Patient Active Problem List   Diagnosis Date Noted   Acute paranoia (HCC) 02/09/2024   Vitamin D deficiency 01/17/2024   Schizoaffective disorder, bipolar type (HCC) 01/16/2024   Chronic insomnia 01/16/2024   Obsessive-compulsive behavior 01/16/2024   Paranoid disorder (HCC) 01/15/2024

## 2024-02-09 NOTE — ED Notes (Signed)
 Patient off unit to [facility] per provider. Patient alert, cooperative, no s/s of distress. Discharge information and belongings given to [facility] for facility. Patient ambulatory off unit, escorted by RN. Patient transported by [employer].

## 2024-02-09 NOTE — ED Notes (Signed)
 Patient was provided dinner

## 2024-02-09 NOTE — ED Notes (Signed)
 [facility] called pts mother to inform her that pt will be transferred to [facility] later today. Pts mother did not answer and the voicemail box was not taking messages. [facility] will try again.   Jacquelynn Cree, [facility]  02/09/24

## 2024-02-09 NOTE — ED Notes (Signed)
 Pt received scheduled medication without difficulty. Pt stated, "Is it safe to take this?" Reassured pt of his safety. Pt tolerated well. Will continue to monitor for safety.

## 2024-02-09 NOTE — ED Provider Notes (Signed)
 Emergency Medicine Observation Re-evaluation Note  Rodney Thomas is a 33 y.o. male, seen on rounds today.  Pt initially presented to the ED for complaints of Suicidal Currently, the patient is not having acute complaints.  Physical Exam  BP 108/67 (BP Location: Right Arm)   Pulse 69   Temp 97.6 F (36.4 C) (Oral)   Resp 18   SpO2 99%  Physical Exam General: Resting comfortably in stretcher Lungs: Normal work of breathing Psych: Calm  ED Course / MDM  EKG:EKG Interpretation Date/Time:  Monday February 08 2024 17:36:20 EDT Ventricular Rate:  75 PR Interval:  118 QRS Duration:  96 QT Interval:  354 QTC Calculation: 395 R Axis:   55  Text Interpretation: Normal sinus rhythm Normal ECG When compared with ECG of 03-Feb-2024 04:40, No significant change was found Confirmed by Dione Booze ([id number]) on 02/09/2024 2:55:55 AM  I have reviewed the labs performed to date as well as medications administered while in observation.  Recent changes in the last 24 hours include seen by psych. Recommends inpatient treatment. Valproic acid level this am undetectable.   Plan  Current plan is for placement.    Rondel Baton, MD 02/09/24 [phone]

## 2024-02-09 NOTE — Group Note (Signed)
 Group Topic: Relaxation  Group Date: 02/09/2024 Start Time: 1645 End Time: 1715 Facilitators: Prentice Docker, RN  Department: [facility]  Number of Participants: 7  Group Focus: coping skills Treatment Modality:  Skills Training Interventions utilized were group exercise Purpose: enhance coping skills  Name: Ronav Furney Date of Birth: 1991-03-01  MR: [id number]     Level of Participation: active Quality of Participation: cooperative Interactions with others: engaged Mood/Affect: appropriate Triggers (if applicable): none identified Cognition: coherent/clear Progress: Minimal Response: "it really do work" Plan: patient will be encouraged to utilize learned coping skill for withdrawals Patients Problems:  Patient Active Problem List   Diagnosis Date Noted   Acute paranoia (HCC) 02/09/2024   Vitamin D deficiency 01/17/2024   Schizoaffective disorder, bipolar type (HCC) 01/16/2024   Chronic insomnia 01/16/2024   Obsessive-compulsive behavior 01/16/2024   Paranoid disorder (HCC) 01/15/2024

## 2024-02-09 NOTE — ED Provider Notes (Addendum)
 Facility Based Crisis Admission H&P  Date: 02/09/24 Patient Name: Rodney Thomas MRN: [id number] Chief Complaint: Patient states that he feels extremely paranoid and passively suicidal.  Diagnoses:  Final diagnoses:  None    HPI: The patient is a 33 year old male who presented himself to the ED on 02/08/2024 with severe paranoia in the context of schizoaffective disorder, bipolar type who has been inconsistent with his medications.  He presents himself as reporting ongoing paranoia and states that he has been feeling very anxious and is unable to stay around people because he thinks they are monitoring him and watching him.  He claims that he continues to take the risperidone as prescribed and apparently this has stopped his hallucinations but he remains paranoid.  He also endorses depression and suicidal ideations and claims that after starting the medication his depression had improved and that he was not as suicidal. When seen in the ED the patient claimed that he was taking his Depakote at 2000 mg at night.  However his Depakote level was less than 10. The patient requested a voluntary admission to [facility] however because he was fairly cooperative with the medications and treatment he was admitted to [facility] for acute stabilization.  On admission the patient was seen and evaluated and was noted to be very labile and anxious and ambivalent.  He reports that he feels very claustrophobic and that he is willing to take his medications as prescribed but apparently it causes some side effects for about an hour or so before it improves.  He would like to take his medication only once at night because he feels that he has side effects if he takes it in the daytime.  He denies auditory and visual hallucinations.  He admits to depression and he admits to passive suicidal ideations but states that acute symptoms had subsided after he started taking Risperdal.  He continues to endorse a feeling that people are  watching him and following him.  Apparently that makes him anxious and suicidal.  He is able to contract for safety.  He is also willing to take medication as prescribed.  After admission patient continued to express ambivalence.  He wants to take the Risperdal only once at night and claims that he has been taking the Depakote 2000 mg at night however there is inconsistency in his reporting because he did report that it was only 1 tablet at night at the ED.  Since his Depakote level is less than 10, we will restart him at 500 mg at night and titrate it up as tolerated.  PHQ 2-9:   Flowsheet Row ED from 02/09/2024 in [facility] ED from 02/08/2024 in [facility] Emergency Department at [facility] ED from 02/07/2024 in [facility] Emergency Department at [facility]  C-SSRS RISK CATEGORY High Risk High Risk Error: Question 6 not populated         Total Time spent with patient: 30 minutes  Musculoskeletal  Strength & Muscle Tone: within normal limits Gait & Station: normal Patient leans: N/A  Psychiatric Specialty Exam  Presentation General Appearance:  Disheveled; Bizarre  Eye Contact: Fair  Speech: Pressured  Speech Volume: Increased  Handedness: Right   Mood and Affect  Mood: Euthymic  Affect: Inappropriate   Thought Process  Thought Processes: Coherent  Descriptions of Associations:Intact  Orientation:Full (Time, Place and Person)  Thought Content:Logical  Diagnosis of Schizophrenia or Schizoaffective disorder in past: Yes  Duration of Psychotic Symptoms: Greater than  six months  Hallucinations:No data recorded Ideas of Reference:Percusatory  Suicidal Thoughts:No data recorded Homicidal Thoughts:No data recorded  Sensorium  Memory: Immediate Good; Recent Good  Judgment: Poor  Insight: Fair   Art therapist  Concentration: Good  Attention Span: Good  Recall: Good  Fund of  Knowledge: Good  Language: Good   Psychomotor Activity  Psychomotor Activity:No data recorded  Assets  Assets: Communication Skills; Social Support; Housing   Sleep  Sleep:No data recorded  No data recorded  Physical Exam Constitutional:      Appearance: Normal appearance. He is normal weight.  HENT:     Head: Normocephalic.  Neurological:     General: No focal deficit present.     Mental Status: He is alert and oriented to person, place, and time. Mental status is at baseline.    Review of Systems  Psychiatric/Behavioral:  Positive for depression. The patient is nervous/anxious.        Feels very paranoid.     There were no vitals taken for this visit. There is no height or weight on file to calculate BMI.  Past Psychiatric History: Patient has a history of depression paranoia and hypomania.  He was previously hospitalized in February to [facility] with a diagnosis of schizoaffective disorder, bipolar type.  He was discharged on Depakote and Risperdal.  No side effects were noted.  At the time of discharge his valproic acid level was 105 and within therapeutic range.  However in the ED it was noted to be less than 10.  He also has a history of chronic insomnia, obsessive-compulsive behavior and history of suicidal ideations.  Is the patient at risk to self? Yes  Has the patient been a risk to self in the past 6 months? Yes .    Has the patient been a risk to self within the distant past? No   Is the patient a risk to others? No   Has the patient been a risk to others in the past 6 months? No   Has the patient been a risk to others within the distant past? No   Past Medical History: Please see records.Denies any medical problems.  Has vitamin D deficiency. Family History: Brother has history of OCD and alcoholism.  Mother has depression. Social History: Patient is single and reports that he lives with his mother.  He is unemployed.  Last Labs:  Admission on 02/08/2024,  Discharged on 02/09/2024  Component Date Value Ref Range Status   Sodium 02/08/2024 139  135 - 145 mmol/L Final   Potassium 02/08/2024 4.5  3.5 - 5.1 mmol/L Final   Chloride 02/08/2024 106  98 - 111 mmol/L Final   CO2 02/08/2024 25  22 - 32 mmol/L Final   Glucose, Bld 02/08/2024 95  70 - 99 mg/dL Final   Glucose reference range applies only to samples taken after fasting for at least 8 hours.   BUN 02/08/2024 19  6 - 20 mg/dL Final   Creatinine, Ser 02/08/2024 0.88  0.61 - 1.24 mg/dL Final   Calcium 64/33/2951 9.4  8.9 - 10.3 mg/dL Final   Total Protein 88/41/6606 7.5  6.5 - 8.1 g/dL Final   Albumin 30/16/0109 4.4  3.5 - 5.0 g/dL Final   AST 32/35/5732 27  15 - 41 U/L Final   ALT 02/08/2024 42  0 - 44 U/L Final   Alkaline Phosphatase 02/08/2024 50  38 - 126 U/L Final   Total Bilirubin 02/08/2024 0.6  0.0 - 1.2 mg/dL  Final   GFR, Estimated 02/08/2024 >60  >60 mL/min Final   Comment: (NOTE) Calculated using the CKD-EPI Creatinine Equation (2021)    Anion gap 02/08/2024 8  5 - 15 Final   Performed at [facility], [address], [place], [place] [address]   Alcohol, Ethyl (B) 02/08/2024 <10  <10 mg/dL Final   Comment: (NOTE) Lowest detectable limit for serum alcohol is 10 mg/dL.  For medical purposes only. Performed at [facility], [address] [place], [place] [address]    Salicylate Lvl 02/08/2024 <7.0 (L)  7.0 - 30.0 mg/dL Final   Performed at [facility], [address] [place], [place] [address]   Acetaminophen (Tylenol), Serum 02/08/2024 <10 (L)  10 - 30 ug/mL Final   Comment: (NOTE) Therapeutic concentrations vary significantly. A range of 10-30 ug/mL  may be an effective concentration for many patients. However, some  are best treated at concentrations outside of this range. Acetaminophen concentrations >150 ug/mL at 4 hours after ingestion  and >50 ug/mL at 12 hours after ingestion are often associated with   toxic reactions.  Performed at [facility], [address], [place], [place] [address]    WBC 02/08/2024 6.2  4.0 - 10.5 K/uL Final   RBC 02/08/2024 5.27  4.22 - 5.81 MIL/uL Final   Hemoglobin 02/08/2024 15.0  13.0 - 17.0 g/dL Final   HCT 02/72/5366 46.6  39.0 - 52.0 % Final   MCV 02/08/2024 88.4  80.0 - 100.0 fL Final   MCH 02/08/2024 28.5  26.0 - 34.0 pg Final   MCHC 02/08/2024 32.2  30.0 - 36.0 g/dL Final   RDW 44/01/4741 15.9 (H)  11.5 - 15.5 % Final   Platelets 02/08/2024 214  150 - 400 K/uL Final   nRBC 02/08/2024 0.0  0.0 - 0.2 % Final   Performed at [facility], [address], [place], [place] [address]   Opiates 02/08/2024 NONE DETECTED  NONE DETECTED Final   Cocaine 02/08/2024 NONE DETECTED  NONE DETECTED Final   Benzodiazepines 02/08/2024 NONE DETECTED  NONE DETECTED Final   Amphetamines 02/08/2024 NONE DETECTED  NONE DETECTED Final   Tetrahydrocannabinol 02/08/2024 NONE DETECTED  NONE DETECTED Final   Barbiturates 02/08/2024 NONE DETECTED  NONE DETECTED Final   Comment: (NOTE) DRUG SCREEN FOR MEDICAL PURPOSES ONLY.  IF CONFIRMATION IS NEEDED FOR ANY PURPOSE, NOTIFY LAB WITHIN 5 DAYS.  LOWEST DETECTABLE LIMITS FOR URINE DRUG SCREEN Drug Class                     Cutoff (ng/mL) Amphetamine and metabolites    1000 Barbiturate and metabolites    200 Benzodiazepine                 200 Opiates and metabolites        300 Cocaine and metabolites        300 THC                            50 Performed at [facility], [address], [place], [place] [address]    Valproic Acid Lvl 02/09/2024 <10 (L)  50.0 - 100.0 ug/mL Final   Comment: RESULT CONFIRMED BY MANUAL DILUTION Performed at [facility], [address] [place], [place] [address]   Admission on 02/07/2024, Discharged on 02/07/2024  Component Date Value Ref Range Status   Lipase  02/07/2024 28  11 - 51 U/L Final   Performed at  [facility], [address] [place], [place] [address]   Sodium 02/07/2024 139  135 - 145 mmol/L Final   Potassium 02/07/2024 4.4  3.5 - 5.1 mmol/L Final   Chloride 02/07/2024 105  98 - 111 mmol/L Final   CO2 02/07/2024 25  22 - 32 mmol/L Final   Glucose, Bld 02/07/2024 95  70 - 99 mg/dL Final   Glucose reference range applies only to samples taken after fasting for at least 8 hours.   BUN 02/07/2024 16  6 - 20 mg/dL Final   Creatinine, Ser 02/07/2024 1.34 (H)  0.61 - 1.24 mg/dL Final   Calcium 98/09/9146 9.0  8.9 - 10.3 mg/dL Final   Total Protein 82/95/6213 6.4 (L)  6.5 - 8.1 g/dL Final   Albumin 08/65/7846 3.7  3.5 - 5.0 g/dL Final   AST 96/29/5284 28  15 - 41 U/L Final   ALT 02/07/2024 45 (H)  0 - 44 U/L Final   Alkaline Phosphatase 02/07/2024 47  38 - 126 U/L Final   Total Bilirubin 02/07/2024 0.6  0.0 - 1.2 mg/dL Final   GFR, Estimated 02/07/2024 >60  >60 mL/min Final   Comment: (NOTE) Calculated using the CKD-EPI Creatinine Equation (2021)    Anion gap 02/07/2024 9  5 - 15 Final   Performed at [facility], [address] [place], [place] [address]   WBC 02/07/2024 6.3  4.0 - 10.5 K/uL Final   RBC 02/07/2024 4.94  4.22 - 5.81 MIL/uL Final   Hemoglobin 02/07/2024 14.0  13.0 - 17.0 g/dL Final   HCT 11/26/7251 42.2  39.0 - 52.0 % Final   MCV 02/07/2024 85.4  80.0 - 100.0 fL Final   MCH 02/07/2024 28.3  26.0 - 34.0 pg Final   MCHC 02/07/2024 33.2  30.0 - 36.0 g/dL Final   RDW 66/44/0347 15.7 (H)  11.5 - 15.5 % Final   Platelets 02/07/2024 196  150 - 400 K/uL Final   nRBC 02/07/2024 0.0  0.0 - 0.2 % Final   Performed at [facility], [address] [place], [place] [address]   Color, Urine 02/07/2024 YELLOW  YELLOW Final   APPearance 02/07/2024 CLEAR  CLEAR Final   Specific Gravity, Urine 02/07/2024 1.017  1.005 - 1.030 Final   pH 02/07/2024 7.0  5.0 - 8.0 Final   Glucose, UA 02/07/2024 NEGATIVE  NEGATIVE mg/dL Final   Hgb urine dipstick 02/07/2024  NEGATIVE  NEGATIVE Final   Bilirubin Urine 02/07/2024 NEGATIVE  NEGATIVE Final   Ketones, ur 02/07/2024 NEGATIVE  NEGATIVE mg/dL Final   Protein, ur 63/87/5643 NEGATIVE  NEGATIVE mg/dL Final   Nitrite 32/95/1884 NEGATIVE  NEGATIVE Final   Leukocytes,Ua 02/07/2024 NEGATIVE  NEGATIVE Final   Performed at [facility], [address] [place], [place] [address]   Opiates 02/07/2024 NONE DETECTED  NONE DETECTED Final   Cocaine 02/07/2024 NONE DETECTED  NONE DETECTED Final   Benzodiazepines 02/07/2024 NONE DETECTED  NONE DETECTED Final   Amphetamines 02/07/2024 NONE DETECTED  NONE DETECTED Final   Tetrahydrocannabinol 02/07/2024 NONE DETECTED  NONE DETECTED Final   Barbiturates 02/07/2024 NONE DETECTED  NONE DETECTED Final   Comment: (NOTE) DRUG SCREEN FOR MEDICAL PURPOSES ONLY.  IF CONFIRMATION IS NEEDED FOR ANY PURPOSE, NOTIFY LAB WITHIN 5 DAYS.  LOWEST DETECTABLE LIMITS FOR URINE DRUG SCREEN Drug Class  Cutoff (ng/mL) Amphetamine and metabolites    1000 Barbiturate and metabolites    200 Benzodiazepine                 200 Opiates and metabolites        300 Cocaine and metabolites        300 THC                            50 Performed at [facility], [address], [place], [place] [address]    Troponin I (High Sensitivity) 02/07/2024 4  <18 ng/L Final   Comment: (NOTE) Elevated high sensitivity troponin I (hsTnI) values and significant  changes across serial measurements may suggest ACS but many other  chronic and acute conditions are known to elevate hsTnI results.  Refer to the "Links" section for chest pain algorithms and additional  guidance. Performed at [facility], [address] [place], [place] [address]   Admission on 02/03/2024, Discharged on 02/03/2024  Component Date Value Ref Range Status   WBC 02/03/2024 8.8  4.0 - 10.5 K/uL Final   RBC 02/03/2024 4.89  4.22 - 5.81 MIL/uL Final   Hemoglobin 02/03/2024 13.7  13.0 - 17.0  g/dL Final   HCT 56/21/3086 41.9  39.0 - 52.0 % Final   MCV 02/03/2024 85.7  80.0 - 100.0 fL Final   MCH 02/03/2024 28.0  26.0 - 34.0 pg Final   MCHC 02/03/2024 32.7  30.0 - 36.0 g/dL Final   RDW 57/84/6962 15.9 (H)  11.5 - 15.5 % Final   Platelets 02/03/2024 149 (L)  150 - 400 K/uL Final   nRBC 02/03/2024 0.0  0.0 - 0.2 % Final   Neutrophils Relative % 02/03/2024 53  % Final   Neutro Abs 02/03/2024 4.7  1.7 - 7.7 K/uL Final   Lymphocytes Relative 02/03/2024 30  % Final   Lymphs Abs 02/03/2024 2.6  0.7 - 4.0 K/uL Final   Monocytes Relative 02/03/2024 9  % Final   Monocytes Absolute 02/03/2024 0.8  0.1 - 1.0 K/uL Final   Eosinophils Relative 02/03/2024 6  % Final   Eosinophils Absolute 02/03/2024 0.5  0.0 - 0.5 K/uL Final   Basophils Relative 02/03/2024 1  % Final   Basophils Absolute 02/03/2024 0.1  0.0 - 0.1 K/uL Final   Immature Granulocytes 02/03/2024 1  % Final   Abs Immature Granulocytes 02/03/2024 0.10 (H)  0.00 - 0.07 K/uL Final   Performed at [facility], [address], [place], [place] [address]   Sodium 02/03/2024 138  135 - 145 mmol/L Final   Potassium 02/03/2024 3.9  3.5 - 5.1 mmol/L Final   Chloride 02/03/2024 104  98 - 111 mmol/L Final   CO2 02/03/2024 24  22 - 32 mmol/L Final   Glucose, Bld 02/03/2024 117 (H)  70 - 99 mg/dL Final   Glucose reference range applies only to samples taken after fasting for at least 8 hours.   BUN 02/03/2024 19  6 - 20 mg/dL Final   Creatinine, Ser 02/03/2024 1.09  0.61 - 1.24 mg/dL Final   Calcium 13/24/4010 8.9  8.9 - 10.3 mg/dL Final   GFR, Estimated 02/03/2024 >60  >60 mL/min Final   Comment: (NOTE) Calculated using the CKD-EPI Creatinine Equation (2021)    Anion gap 02/03/2024 10  5 - 15 Final   Performed at [facility], [address] [place], [place]  [address]   Troponin I (High Sensitivity) 02/03/2024 3  <18 ng/L Final   Comment: (NOTE) Elevated high sensitivity troponin I (hsTnI)  values and significant  changes across serial measurements may suggest ACS but many other  chronic and acute conditions are known to elevate hsTnI results.  Refer to the "Links" section for chest pain algorithms and additional  guidance. Performed at [facility], [address] [place], [place] [address]   Admission on 01/15/2024, Discharged on 01/23/2024  Component Date Value Ref Range Status   Folate 01/16/2024 8.2  >5.9 ng/mL Final   Performed at [facility], [address] [place], [place] [address]   Hgb A1c MFr Bld 01/16/2024 5.4  4.8 - 5.6 % Final   Comment: (NOTE) Pre diabetes:          5.7%-6.4%  Diabetes:              >6.4%  Glycemic control for   <7.0% adults with diabetes    Mean Plasma Glucose 01/16/2024 108.28  mg/dL Final   Performed at [facility], [address], [place], [place] [address]   Cholesterol 01/16/2024 164  0 - 200 mg/dL Final   Triglycerides 53/66/4403 177 (H)  <150 mg/dL Final   HDL 47/42/5956 57  >40 mg/dL Final   Total CHOL/HDL Ratio 01/16/2024 2.9  RATIO Final   VLDL 01/16/2024 35  0 - 40 mg/dL Final   LDL Cholesterol 01/16/2024 72  0 - 99 mg/dL Final   Comment:        Total Cholesterol/HDL:CHD Risk Coronary Heart Disease Risk Table                     Men   Women  1/2 Average Risk   3.4   3.3  Average Risk       5.0   4.4  2 X Average Risk   9.6   7.1  3 X Average Risk  23.4   11.0        Use the calculated Patient Ratio above and the CHD Risk Table to determine the patient's CHD Risk.        ATP III CLASSIFICATION (LDL):  <100     mg/dL   Optimal  387-564  mg/dL   Near or Above                    Optimal  130-159  mg/dL   Borderline  332-951  mg/dL   High  >884     mg/dL   Very High Performed at [facility], [address] [place], [place] [address]    RPR Ser Ql 01/16/2024 NON REACTIVE  NON REACTIVE Final   Performed at [facility], [address] [place], [place] [address]   TSH 01/16/2024 2.767  0.350 - 4.500 uIU/mL Final   Comment: Performed by a 3rd Generation assay with a functional sensitivity of <=0.01 uIU/mL. Performed at [facility], [address] [place], [place] [address]    Vitamin B-12 01/16/2024 410  180 - 914 pg/mL Final   Comment: (NOTE) This assay is not validated for testing neonatal or myeloproliferative syndrome specimens for Vitamin B12 levels. Performed at [facility], [address] [place], [place] [address]    Vit D, 25-Hydroxy 01/16/2024 9.67 (L)  30 - 100 ng/mL Final   Comment: (NOTE) Vitamin D deficiency has been defined by the  Institute of Medicine  and an Endocrine Society practice guideline as a level of serum 25-OH  vitamin D less than 20 ng/mL (1,2). The Endocrine Society went on to  further define vitamin D insufficiency as a level between 21 and 29  ng/mL (2).  1. IOM (Institute of Medicine). 2010. Dietary reference intakes for  calcium and D. [place]: [employer]. 2. Holick MF, Binkley [place], Bischoff-Ferrari HA, et al. Evaluation,  treatment, and prevention of vitamin D deficiency: an Endocrine  Society clinical practice guideline, JCEM. 2011 Jul; 96(7): 1911-30.  Performed at [facility], [address], [place], [place] [address]    WBC 01/20/2024 14.5 (H)  4.0 - 10.5 K/uL Final   RBC 01/20/2024 5.13  4.22 - 5.81 MIL/uL Final   Hemoglobin 01/20/2024 14.4  13.0 - 17.0 g/dL Final   HCT 54/07/8118 44.4  39.0 - 52.0 % Final   MCV 01/20/2024 86.5  80.0 - 100.0 fL Final   MCH 01/20/2024 28.1  26.0 - 34.0 pg Final   MCHC 01/20/2024 32.4  30.0 - 36.0 g/dL Final   RDW 14/78/2956 16.0 (H)  11.5 - 15.5 % Final   Platelets 01/20/2024 198  150 - 400 K/uL Final   nRBC 01/20/2024 0.0  0.0 - 0.2 % Final   Neutrophils Relative % 01/20/2024 77  % Final   Neutro Abs 01/20/2024 11.2 (H)  1.7 - 7.7 K/uL Final   Lymphocytes Relative  01/20/2024 12  % Final   Lymphs Abs 01/20/2024 1.7  0.7 - 4.0 K/uL Final   Monocytes Relative 01/20/2024 7  % Final   Monocytes Absolute 01/20/2024 0.9  0.1 - 1.0 K/uL Final   Eosinophils Relative 01/20/2024 2  % Final   Eosinophils Absolute 01/20/2024 0.2  0.0 - 0.5 K/uL Final   Basophils Relative 01/20/2024 0  % Final   Basophils Absolute 01/20/2024 0.0  0.0 - 0.1 K/uL Final   Immature Granulocytes 01/20/2024 2  % Final   Abs Immature Granulocytes 01/20/2024 0.35 (H)  0.00 - 0.07 K/uL Final   Performed at [facility], [address], [place], [place] [address]   Sodium 01/20/2024 139  135 - 145 mmol/L Final   Potassium 01/20/2024 3.8  3.5 - 5.1 mmol/L Final   Chloride 01/20/2024 101  98 - 111 mmol/L Final   CO2 01/20/2024 25  22 - 32 mmol/L Final   Glucose, Bld 01/20/2024 97  70 - 99 mg/dL Final   Glucose reference range applies only to samples taken after fasting for at least 8 hours.   BUN 01/20/2024 10  6 - 20 mg/dL Final   Creatinine, Ser 01/20/2024 0.83  0.61 - 1.24 mg/dL Final   Calcium 65/78/4696 9.4  8.9 - 10.3 mg/dL Final   Total Protein 29/52/8413 7.3  6.5 - 8.1 g/dL Final   Albumin 24/40/1027 4.2  3.5 - 5.0 g/dL Final   AST 25/36/6440 71 (H)  15 - 41 U/L Final   ALT 01/20/2024 109 (H)  0 - 44 U/L Final   Alkaline Phosphatase 01/20/2024 52  38 - 126 U/L Final   Total Bilirubin 01/20/2024 0.5  0.0 - 1.2 mg/dL Final   GFR, Estimated 01/20/2024 >60  >60 mL/min Final   Comment: (NOTE) Calculated using the CKD-EPI Creatinine Equation (2021)    Anion gap 01/20/2024 13  5 - 15 Final   Performed at [facility], [address], [place], [place] [address]   Lipase 01/20/2024  27  11 - 51 U/L Final   Performed at [facility], [address], [place], [place] [address]   Color, Urine 01/20/2024 STRAW (A)  YELLOW Final   APPearance 01/20/2024 CLEAR  CLEAR Final   Specific Gravity, Urine 01/20/2024 >1.046 (H)  1.005 - 1.030  Final   pH 01/20/2024 5.0  5.0 - 8.0 Final   Glucose, UA 01/20/2024 NEGATIVE  NEGATIVE mg/dL Final   Hgb urine dipstick 01/20/2024 NEGATIVE  NEGATIVE Final   Bilirubin Urine 01/20/2024 NEGATIVE  NEGATIVE Final   Ketones, ur 01/20/2024 NEGATIVE  NEGATIVE mg/dL Final   Protein, ur 18/84/1660 NEGATIVE  NEGATIVE mg/dL Final   Nitrite 63/11/6008 NEGATIVE  NEGATIVE Final   Leukocytes,Ua 01/20/2024 NEGATIVE  NEGATIVE Final   Performed at [facility], [address] [place], [place] [address]   Prothrombin Time 01/20/2024 12.3  11.4 - 15.2 seconds Final   INR 01/20/2024 0.9  0.8 - 1.2 Final   Comment: (NOTE) INR goal varies based on device and disease states. Performed at [facility], [address] [place], [place] [address]    Valproic Acid Lvl 01/23/2024 105 (H)  50.0 - 100.0 ug/mL Final   Performed at [facility], [address] [place], [place] [address]   WBC 01/23/2024 6.3  4.0 - 10.5 K/uL Final   RBC 01/23/2024 5.04  4.22 - 5.81 MIL/uL Final   Hemoglobin 01/23/2024 14.0  13.0 - 17.0 g/dL Final   HCT 70/62/3762 44.3  39.0 - 52.0 % Final   MCV 01/23/2024 87.9  80.0 - 100.0 fL Final   MCH 01/23/2024 27.8  26.0 - 34.0 pg Final   MCHC 01/23/2024 31.6  30.0 - 36.0 g/dL Final   RDW 83/15/1761 16.0 (H)  11.5 - 15.5 % Final   Platelets 01/23/2024 200  150 - 400 K/uL Final   nRBC 01/23/2024 0.0  0.0 - 0.2 % Final   Neutrophils Relative % 01/23/2024 43  % Final   Neutro Abs 01/23/2024 2.7  1.7 - 7.7 K/uL Final   Lymphocytes Relative 01/23/2024 40  % Final   Lymphs Abs 01/23/2024 2.5  0.7 - 4.0 K/uL Final   Monocytes Relative 01/23/2024 6  % Final   Monocytes Absolute 01/23/2024 0.4  0.1 - 1.0 K/uL Final   Eosinophils Relative 01/23/2024 8  % Final   Eosinophils Absolute 01/23/2024 0.5  0.0 - 0.5 K/uL Final   Basophils Relative 01/23/2024 1  % Final   Basophils Absolute 01/23/2024 0.0  0.0 - 0.1 K/uL Final   Immature  Granulocytes 01/23/2024 2  % Final   Abs Immature Granulocytes 01/23/2024 0.13 (H)  0.00 - 0.07 K/uL Final   Performed at [facility], [address], [place], [place] [address]   Sodium 01/23/2024 140  135 - 145 mmol/L Final   Potassium 01/23/2024 4.0  3.5 - 5.1 mmol/L Final   Chloride 01/23/2024 104  98 - 111 mmol/L Final   CO2 01/23/2024 28  22 - 32 mmol/L Final   Glucose, Bld 01/23/2024 72  70 - 99 mg/dL Final   Glucose reference range applies only to samples taken after fasting for at least 8 hours.   BUN 01/23/2024 12  6 - 20 mg/dL Final   Creatinine, Ser 01/23/2024 0.79  0.61 - 1.24 mg/dL Final   Calcium 10/62/6948 9.4  8.9 - 10.3 mg/dL Final   Total Protein 54/62/7035 6.5  6.5 - 8.1 g/dL Final   Albumin  01/23/2024 3.7  3.5 - 5.0 g/dL Final   AST 84/69/6295 24  15 - 41 U/L Final   ALT 01/23/2024 51 (H)  0 - 44 U/L Final   Alkaline Phosphatase 01/23/2024 50  38 - 126 U/L Final   Total Bilirubin 01/23/2024 0.4  0.0 - 1.2 mg/dL Final   GFR, Estimated 01/23/2024 >60  >60 mL/min Final   Comment: (NOTE) Calculated using the CKD-EPI Creatinine Equation (2021)    Anion gap 01/23/2024 8  5 - 15 Final   Performed at [facility], [address], [place], [place] [address]  Admission on 01/14/2024, Discharged on 01/15/2024  Component Date Value Ref Range Status   Sodium 01/14/2024 142  135 - 145 mmol/L Final   Potassium 01/14/2024 3.6  3.5 - 5.1 mmol/L Final   Chloride 01/14/2024 107  98 - 111 mmol/L Final   CO2 01/14/2024 27  22 - 32 mmol/L Final   Glucose, Bld 01/14/2024 100 (H)  70 - 99 mg/dL Final   Glucose reference range applies only to samples taken after fasting for at least 8 hours.   BUN 01/14/2024 18  6 - 20 mg/dL Final   Creatinine, Ser 01/14/2024 0.86  0.61 - 1.24 mg/dL Final   Calcium 24/40/1027 9.1  8.9 - 10.3 mg/dL Final   Total Protein 25/36/6440 7.2  6.5 - 8.1 g/dL Final   Albumin 34/74/2595 4.2  3.5 - 5.0 g/dL Final   AST  63/87/5643 32  15 - 41 U/L Final   ALT 01/14/2024 36  0 - 44 U/L Final   Alkaline Phosphatase 01/14/2024 53  38 - 126 U/L Final   Total Bilirubin 01/14/2024 0.7  0.0 - 1.2 mg/dL Final   GFR, Estimated 01/14/2024 >60  >60 mL/min Final   Comment: (NOTE) Calculated using the CKD-EPI Creatinine Equation (2021)    Anion gap 01/14/2024 8  5 - 15 Final   Performed at [facility], [address], [place], [place] [address]   Alcohol, Ethyl (B) 01/14/2024 <10  <10 mg/dL Final   Comment: (NOTE) Lowest detectable limit for serum alcohol is 10 mg/dL.  For medical purposes only. Performed at [facility], [address] [place], [place] [address]    Salicylate Lvl 01/14/2024 <7.0 (L)  7.0 - 30.0 mg/dL Final   Performed at [facility], [address], [place], [place] [address]   Acetaminophen (Tylenol), Serum 01/14/2024 <10 (L)  10 - 30 ug/mL Final   Comment: (NOTE) Therapeutic concentrations vary significantly. A range of 10-30 ug/mL  may be an effective concentration for many patients. However, some  are best treated at concentrations outside of this range. Acetaminophen concentrations >150 ug/mL at 4 hours after ingestion  and >50 ug/mL at 12 hours after ingestion are often associated with  toxic reactions.  Performed at [facility], [address] [place], [place] [address]    WBC 01/14/2024 12.3 (H)  4.0 - 10.5 K/uL Final   RBC 01/14/2024 5.25  4.22 - 5.81 MIL/uL Final   Hemoglobin 01/14/2024 14.6  13.0 - 17.0 g/dL Final   HCT 93/23/5573 45.3  39.0 - 52.0 % Final   MCV 01/14/2024 86.3  80.0 - 100.0 fL Final   MCH 01/14/2024 27.8  26.0 - 34.0 pg Final   MCHC 01/14/2024 32.2  30.0 - 36.0 g/dL Final   RDW 22/12/5425 15.4  11.5 - 15.5 % Final   Platelets 01/14/2024 218  150 - 400  K/uL Final   nRBC 01/14/2024 0.0  0.0 - 0.2 % Final   Performed at [facility], [address],  [place], [place] [address]   Opiates 01/14/2024 NONE DETECTED  NONE DETECTED Final   Cocaine 01/14/2024 NONE DETECTED  NONE DETECTED Final   Benzodiazepines 01/14/2024 NONE DETECTED  NONE DETECTED Final   Amphetamines 01/14/2024 NONE DETECTED  NONE DETECTED Final   Tetrahydrocannabinol 01/14/2024 NONE DETECTED  NONE DETECTED Final   Barbiturates 01/14/2024 NONE DETECTED  NONE DETECTED Final   Comment: (NOTE) DRUG SCREEN FOR MEDICAL PURPOSES ONLY.  IF CONFIRMATION IS NEEDED FOR ANY PURPOSE, NOTIFY LAB WITHIN 5 DAYS.  LOWEST DETECTABLE LIMITS FOR URINE DRUG SCREEN Drug Class                     Cutoff (ng/mL) Amphetamine and metabolites    1000 Barbiturate and metabolites    200 Benzodiazepine                 200 Opiates and metabolites        300 Cocaine and metabolites        300 THC                            50 Performed at [facility], [address], [place], [place] [address]     Allergies: Patient has no known allergies.  Medications:  Facility Ordered Medications  Medication   traZODone (DESYREL) tablet 50 mg   hydrOXYzine (ATARAX) tablet 25 mg   acetaminophen (TYLENOL) tablet 650 mg   alum & mag hydroxide-simeth (MAALOX/MYLANTA) 200-200-20 MG/5ML suspension 30 mL   magnesium hydroxide (MILK OF MAGNESIA) suspension 30 mL   haloperidol (HALDOL) tablet 5 mg   And   diphenhydrAMINE (BENADRYL) capsule 50 mg   divalproex (DEPAKOTE ER) 24 hr tablet 500 mg   [START ON 02/10/2024] risperiDONE (RISPERDAL) tablet 6 mg   PTA Medications  Medication Sig   risperiDONE (RISPERDAL) 3 MG tablet Take 2 tablets (6 mg total) by mouth at bedtime.   divalproex (DEPAKOTE ER) 500 MG 24 hr tablet Take 4 tablets (2,000 mg total) by mouth at bedtime. (Patient taking differently: Take 500 mg by mouth at bedtime.)   ondansetron (ZOFRAN-ODT) 4 MG disintegrating tablet Take 1 tablet (4 mg total) by mouth every 8 (eight) hours as needed for nausea or vomiting. (Patient not taking:  Reported on 02/09/2024)   hyoscyamine (LEVSIN/SL) 0.125 MG SL tablet Place 1 tablet (0.125 mg total) under the tongue every 4 (four) hours as needed. (Patient not taking: Reported on 02/09/2024)   traZODone (DESYREL) 50 MG tablet Take 1 tablet (50 mg total) by mouth at bedtime. (Patient not taking: Reported on 02/09/2024)    Long Term Goals: Improvement in symptoms so as ready for discharge  Short Term Goals: Patient will verbalize feelings in meetings with treatment team members., Patient will attend at least of 50% of the groups daily., Pt will complete the PHQ9 on admission, day 3 and discharge., Patient will participate in completing the [place] Suicide Severity Rating Scale, Patient will score a low risk of violence for 24 hours prior to discharge, and Patient will take medications as prescribed daily.  Medical Decision Making  The patient is admitted to the [facility] for stabilization of his psychiatric symptoms.  He has been cooperative and voluntary for this admission.  He will be restarted on his medications.  Obviously patient has  been noncompliant with his Depakote and this will be restarted and titrated up as tolerated. He remains anxious and an anxious with periodic agitation.  He also remains ambivalent about the admission to the [facility].  However he is agreeing to stay till tomorrow and reports that he has a court case pending which is in fact not accurate.  We will verify this tomorrow.  In the meantime will be restarted on his home medications.  The plan is to restart his home medications and add or titrate medications as indicated.  Recommendations  Based on my evaluation the patient does not appear to have an emergency medical condition. Based on my evaluation the patient appears to have an emergency medical condition for which I recommend the patient be transferred to the emergency department for further evaluation. The plan is to restart medications and stabilize him.  However if he is  demonstrating worsening symptoms that would preclude him from being treated at the [facility], we will admit him to [facility] . Prognosis is fair to guarded.   Rex Kras, MD 02/09/24  4:41 PM

## 2024-02-09 NOTE — ED Notes (Signed)
 [facility] called pts mother to inform her that pt was transferred and admitted to [facility]. [facility] provided contact information to call pt.   Jacquelynn Cree, [facility]  02/09/24

## 2024-02-10 DIAGNOSIS — R45851 Suicidal ideations: Secondary | ICD-10-CM

## 2024-02-10 MED ORDER — OLANZAPINE 10 MG IM SOLR: 10.0000 mg | Freq: Three times a day (TID) | INTRAMUSCULAR | Status: DC | PRN

## 2024-02-10 MED ORDER — OLANZAPINE 5 MG PO TBDP: 5.0000 mg | ORAL_TABLET | Freq: Three times a day (TID) | ORAL | Status: DC | PRN

## 2024-02-10 MED ORDER — OLANZAPINE 10 MG IM SOLR: 5.0000 mg | Freq: Three times a day (TID) | INTRAMUSCULAR | Status: DC | PRN

## 2024-02-10 NOTE — Group Note (Signed)
 Group Topic: Social Support  Group Date: 02/10/2024 Start Time: 2000 End Time: 2030 Facilitators: Rae Lips B  Department: [facility]  Number of Participants: 5  Group Focus: activities of daily living skills, anxiety, check in, daily focus, and social skills Treatment Modality:  Individual Therapy Interventions utilized were patient education and support Purpose: express feelings, increase insight, and regain self-worth  Name: Rodney Thomas Date of Birth: 1991/01/17  MR: [id number]    Level of Participation: PT DID NOT ATTEND GROUPS Quality of Participation: cooperative Interactions with others: gave feedback Mood/Affect: appropriate Triggers (if applicable): NA Cognition: coherent/clear Progress: None Response: NA Plan: patient will be encouraged to go to groups.   Patients Problems:  Patient Active Problem List   Diagnosis Date Noted   Suicidal ideations 02/10/2024   Acute paranoia (HCC) 02/09/2024   Vitamin D deficiency 01/17/2024   Schizoaffective disorder, bipolar type (HCC) 01/16/2024   Chronic insomnia 01/16/2024   Obsessive-compulsive behavior 01/16/2024   Paranoid disorder (HCC) 01/15/2024

## 2024-02-10 NOTE — Care Management Note (Signed)
 [facility] Care Management   Writer faxed a letter to the patients lawyer stating the dates that he has been receiving treatment at the [facility] to [facility] Law [employer]rm [phone])

## 2024-02-10 NOTE — Group Note (Signed)
 Group Topic: Communication  Group Date: 02/09/2024 Start Time: 2000 End Time: 2015 Facilitators: Lauro Bernard Donahoo, NT  Department: [facility]  Number of Participants: 9  Group Focus: check in and clarity of thought Treatment Modality:  Cognitive Behavioral Therapy Interventions utilized were support Purpose: express feelings  Name: Rodney Thomas Date of Birth: 05/23/1991  MR: [id number]    Level of Participation: pt did not attend group Quality of Participation: cooperative Interactions with others: gave feedback Mood/Affect: appropriate Triggers (if applicable): N/A Cognition: coherent/clear Progress: Gaining insight Response: N/A Plan: patient will be encouraged to attend group sessions.  Patients Problems:  Patient Active Problem List   Diagnosis Date Noted   Suicidal ideations 02/10/2024   Acute paranoia (HCC) 02/09/2024   Vitamin D deficiency 01/17/2024   Schizoaffective disorder, bipolar type (HCC) 01/16/2024   Chronic insomnia 01/16/2024   Obsessive-compulsive behavior 01/16/2024   Paranoid disorder (HCC) 01/15/2024

## 2024-02-10 NOTE — ED Notes (Signed)
 Pt is in the dayroom watching TV with peers. Pt denies SI/HI/AVH. Pt has no further complain.No acute distress noted. Will continue to monitor for safety and provide support.

## 2024-02-10 NOTE — ED Notes (Signed)
 Patient sitting with provider in assessment room. No acute distress noted. No concerns voiced. Informed patient to notify staff with any needs or assistance. Patient verbalized understanding or agreement. Safety checks in place per facility policy.

## 2024-02-10 NOTE — Group Note (Signed)
 Group Topic: Social Support  Group Date: 02/10/2024 Start Time: 1400 End Time: 1500 Facilitators: Oz Gammel, Jacklynn Barnacle, RN  Department: [facility]  Number of Participants: 7  Group Focus: community group Treatment Modality:  Interpersonal Therapy Interventions utilized were support Purpose: express feelings  Name: Rodney Thomas Date of Birth: 1991-06-16  MR: [id number]    Level of Participation: did not attend Quality of Participation:  Interactions with others: Mood/Affect:  Triggers (if applicable):  Cognition:  Progress: None Response:  Plan: patient will be encouraged to attend future groups/programming  Patients Problems:  Patient Active Problem List   Diagnosis Date Noted   Suicidal ideations 02/10/2024   Acute paranoia (HCC) 02/09/2024   Vitamin D deficiency 01/17/2024   Schizoaffective disorder, bipolar type (HCC) 01/16/2024   Chronic insomnia 01/16/2024   Obsessive-compulsive behavior 01/16/2024   Paranoid disorder (HCC) 01/15/2024

## 2024-02-10 NOTE — ED Notes (Signed)
 Patient is sleeping. Respirations equal and unlabored, skin warm and dry. No change in assessment or acuity. Routine safety checks conducted according to facility protocol. Will continue to monitor for safety.

## 2024-02-10 NOTE — Group Note (Signed)
 Group Topic: Change and Accountability  Group Date: 02/10/2024 Start Time: 0945 End Time: 1012 Facilitators: Vonzell Schlatter B  Department: [facility]  Number of Participants: 6  Group Focus: daily focus and feeling awareness/expression Treatment Modality:  Psychoeducation Interventions utilized were patient education and support Purpose: increase insight and reinforce self-care  Name: Rodney Thomas Date of Birth: Sep 30, 1991  MR: [id number]    Level of Participation: active Quality of Participation: attentive and cooperative Interactions with others: gave feedback Mood/Affect: positive Triggers (if applicable): N/A Cognition: coherent/clear Progress: Moderate Response: Patient is for sure he wants to go inpatient at a facility that will take him for more then 30days Plan: follow-up needed  Patients Problems:  Patient Active Problem List   Diagnosis Date Noted   Suicidal ideations 02/10/2024   Acute paranoia (HCC) 02/09/2024   Vitamin D deficiency 01/17/2024   Schizoaffective disorder, bipolar type (HCC) 01/16/2024   Chronic insomnia 01/16/2024   Obsessive-compulsive behavior 01/16/2024   Paranoid disorder (HCC) 01/15/2024

## 2024-02-10 NOTE — ED Notes (Signed)
 Patient alert & oriented x4 but appears confused at time. Flight of ideas. Denies intent to harm self or others when asked. Denies A/VH. Patient denies any physical complaints when asked. Patient reports last bowel movement was three (3) days ago, patient refusing intervention at this time. Patient presents with pressured speech, loud volume. Pleasant demeanor. Voices concerns regarding a court date tomorrow at 9 am. No acute distress noted. Support and encouragement provided. Routine safety checks conducted per facility protocol. Encouraged patient to notify staff if any thoughts of harm towards self or others arise. Patient verbalizes understanding and agreement.

## 2024-02-10 NOTE — ED Notes (Signed)
 Patient in hallway calm and composed. No acute distress noted. No concerns voiced. Informed patient to notify staff with any needs or assistance. Patient verbalized understanding or agreement. Safety checks in place per facility policy.

## 2024-02-10 NOTE — ED Provider Notes (Addendum)
 Behavioral Health Progress Note  Date and Time: 02/10/2024 3:03 PM Name: Rodney Thomas MRN:  [id number]  Reason for admission: Harrie Cazarez is a 33 yo male who presented himself to the ED on 02/08/2024 with severe paranoia in the context of schizoaffective disorder, bipolar type who has been inconsistent with his medications. His PPHx is significant for schizoaffective disorder, bipolar type, chronic insomnia, OCD, vitamin D deficiency, paranoia.   Subjective:   Patient reports he has an upcoming court date tomorrow at 9 AM in [place].  He provided verbal consent for [place] to contact his mother and confirm this information.  He reports that today he has continued to experience paranoid ideations, his speech is pressured throughout the interview.  He states "it feels like someone is following me".  He requests that this provider show him his UDS results, as he has concerns he might have "poisons" in his body.  He also becomes ruminative about having a heart condition, patient was reassured that his UDS and EKG looked normal.  Patient was also educated on which medications he was taking, he expressed concerned about the as needed trazodone.  On interview the patient denies any suicidal ideations, homicidal ideations, or hallucinations.  Paranoid ideations are evident on interview. The patient reports no somatic complaints.  Collateral was obtained from patient's mother; I spoke with Pervis Hocking at 1:30 PM at 02/10/2024 --she informs me that the patient does a court date for tomorrow 3/20 at 9 AM, but has been in contact with the patient's lawyer to ensure that the status pushed back due to patient's current hospitalization. She request that evidence of patient's hospitalization be faxed to the patient's lawyer. Mother expressed concern that the patient's ongoing psychosis, she reports that the patient has been significantly paranoid in the home and behaving bizarrely.  She will only  accept the patient home once he is stabilized and has returned to baseline.    Diagnosis:  Final diagnoses:  Schizoaffective disorder, bipolar type (HCC)    Total Time spent with patient: 1.5 hours   Current Medications:  Current Facility-Administered Medications  Medication Dose Route Frequency Provider Last Rate Last Admin   acetaminophen (TYLENOL) tablet 650 mg  650 mg Oral Q6H PRN Onuoha, Josephine C, NP       alum & mag hydroxide-simeth (MAALOX/MYLANTA) 200-200-20 MG/5ML suspension 30 mL  30 mL Oral Q4H PRN Welford Roche, Josephine C, NP       benztropine (COGENTIN) tablet 1 mg  1 mg Oral QHS Rex Kras, MD   1 mg at 02/09/24 2202   busPIRone (BUSPAR) tablet 10 mg  10 mg Oral TID Rex Kras, MD   10 mg at 02/10/24 9518   haloperidol (HALDOL) tablet 5 mg  5 mg Oral TID PRN [name], NP       And   diphenhydrAMINE (BENADRYL) capsule 50 mg  50 mg Oral TID PRN Dahlia Byes C, NP       divalproex (DEPAKOTE ER) 24 hr tablet 500 mg  500 mg Oral QHS Rex Kras, MD   500 mg at 02/09/24 2202   hydrOXYzine (ATARAX) tablet 25 mg  25 mg Oral TID PRN Rex Kras, MD       magnesium hydroxide (MILK OF MAGNESIA) suspension 30 mL  30 mL Oral Daily PRN Dahlia Byes C, NP       OLANZapine (ZYPREXA) injection 10 mg  10 mg Intramuscular TID PRN Sindy Guadeloupe, NP       OLANZapine (ZYPREXA)  injection 5 mg  5 mg Intramuscular TID PRN Sindy Guadeloupe, NP       OLANZapine zydis (ZYPREXA) disintegrating tablet 5 mg  5 mg Oral TID PRN Sindy Guadeloupe, NP       risperiDONE (RISPERDAL) tablet 6 mg  6 mg Oral QHS Rex Kras, MD       sertraline (ZOLOFT) tablet 50 mg  50 mg Oral Daily Rex Kras, MD   50 mg at 02/10/24 4132   traZODone (DESYREL) tablet 50 mg  50 mg Oral QHS PRN Rex Kras, MD       Current Outpatient Medications  Medication Sig Dispense Refill   divalproex (DEPAKOTE ER) 500 MG 24 hr tablet Take 4 tablets (2,000 mg total) by mouth at bedtime.  (Patient taking differently: Take 500 mg by mouth at bedtime.) 120 tablet 0   risperiDONE (RISPERDAL) 3 MG tablet Take 2 tablets (6 mg total) by mouth at bedtime. 60 tablet 0   hyoscyamine (LEVSIN/SL) 0.125 MG SL tablet Place 1 tablet (0.125 mg total) under the tongue every 4 (four) hours as needed. (Patient not taking: Reported on 02/09/2024) 30 tablet 0   ondansetron (ZOFRAN-ODT) 4 MG disintegrating tablet Take 1 tablet (4 mg total) by mouth every 8 (eight) hours as needed for nausea or vomiting. (Patient not taking: Reported on 02/09/2024) 20 tablet 0   traZODone (DESYREL) 50 MG tablet Take 1 tablet (50 mg total) by mouth at bedtime. (Patient not taking: Reported on 02/09/2024) 30 tablet 0    Labs  Lab Results:  Admission on 02/08/2024, Discharged on 02/09/2024  Component Date Value Ref Range Status   Sodium 02/08/2024 139  135 - 145 mmol/L Final   Potassium 02/08/2024 4.5  3.5 - 5.1 mmol/L Final   Chloride 02/08/2024 106  98 - 111 mmol/L Final   CO2 02/08/2024 25  22 - 32 mmol/L Final   Glucose, Bld 02/08/2024 95  70 - 99 mg/dL Final   Glucose reference range applies only to samples taken after fasting for at least 8 hours.   BUN 02/08/2024 19  6 - 20 mg/dL Final   Creatinine, Ser 02/08/2024 0.88  0.61 - 1.24 mg/dL Final   Calcium 44/11/270 9.4  8.9 - 10.3 mg/dL Final   Total Protein 53/66/4403 7.5  6.5 - 8.1 g/dL Final   Albumin 47/42/5956 4.4  3.5 - 5.0 g/dL Final   AST 38/75/6433 27  15 - 41 U/L Final   ALT 02/08/2024 42  0 - 44 U/L Final   Alkaline Phosphatase 02/08/2024 50  38 - 126 U/L Final   Total Bilirubin 02/08/2024 0.6  0.0 - 1.2 mg/dL Final   GFR, Estimated 02/08/2024 >60  >60 mL/min Final   Comment: (NOTE) Calculated using the CKD-EPI Creatinine Equation (2021)    Anion gap 02/08/2024 8  5 - 15 Final   Performed at [facility], [address], [place], [place] [address]   Alcohol, Ethyl (B) 02/08/2024 <10  <10 mg/dL Final   Comment: (NOTE) Lowest  detectable limit for serum alcohol is 10 mg/dL.  For medical purposes only. Performed at [facility], [address], [place], [place] [address]    Salicylate Lvl 02/08/2024 <7.0 (L)  7.0 - 30.0 mg/dL Final   Performed at [facility], [address] [place], [place] [address]   Acetaminophen (Tylenol), Serum 02/08/2024 <10 (L)  10 - 30 ug/mL Final   Comment: (NOTE) Therapeutic concentrations vary significantly. A range of 10-30 ug/mL  may be an effective concentration for many patients. However, some  are best treated at concentrations outside of this range. Acetaminophen concentrations >150 ug/mL at 4 hours after ingestion  and >50 ug/mL at 12 hours after ingestion are often associated with  toxic reactions.  Performed at [facility], [address] [place], [place] [address]    WBC 02/08/2024 6.2  4.0 - 10.5 K/uL Final   RBC 02/08/2024 5.27  4.22 - 5.81 MIL/uL Final   Hemoglobin 02/08/2024 15.0  13.0 - 17.0 g/dL Final   HCT 56/21/3086 46.6  39.0 - 52.0 % Final   MCV 02/08/2024 88.4  80.0 - 100.0 fL Final   MCH 02/08/2024 28.5  26.0 - 34.0 pg Final   MCHC 02/08/2024 32.2  30.0 - 36.0 g/dL Final   RDW 57/84/6962 15.9 (H)  11.5 - 15.5 % Final   Platelets 02/08/2024 214  150 - 400 K/uL Final   nRBC 02/08/2024 0.0  0.0 - 0.2 % Final   Performed at [facility], [address], [place], [place] [address]   Opiates 02/08/2024 NONE DETECTED  NONE DETECTED Final   Cocaine 02/08/2024 NONE DETECTED  NONE DETECTED Final   Benzodiazepines 02/08/2024 NONE DETECTED  NONE DETECTED Final   Amphetamines 02/08/2024 NONE DETECTED  NONE DETECTED Final   Tetrahydrocannabinol 02/08/2024 NONE DETECTED  NONE DETECTED Final   Barbiturates 02/08/2024 NONE DETECTED  NONE DETECTED Final   Comment: (NOTE) DRUG SCREEN FOR MEDICAL PURPOSES ONLY.  IF CONFIRMATION IS NEEDED FOR ANY PURPOSE, NOTIFY LAB WITHIN 5 DAYS.  LOWEST  DETECTABLE LIMITS FOR URINE DRUG SCREEN Drug Class                     Cutoff (ng/mL) Amphetamine and metabolites    1000 Barbiturate and metabolites    200 Benzodiazepine                 200 Opiates and metabolites        300 Cocaine and metabolites        300 THC                            50 Performed at [facility], [address] [place], [place] [address]    Valproic Acid Lvl 02/09/2024 <10 (L)  50.0 - 100.0 ug/mL Final   Comment: RESULT CONFIRMED BY MANUAL DILUTION Performed at [facility], [address] [place], [place] [address]   Admission on 02/07/2024, Discharged on 02/07/2024  Component Date Value Ref Range Status   Lipase 02/07/2024 28  11 - 51 U/L Final   Performed at [facility], [address], [place], [place] [address]   Sodium 02/07/2024 139  135 - 145 mmol/L Final   Potassium 02/07/2024 4.4  3.5 - 5.1 mmol/L Final   Chloride 02/07/2024 105  98 - 111 mmol/L Final   CO2 02/07/2024 25  22 - 32 mmol/L Final   Glucose, Bld 02/07/2024 95  70 - 99 mg/dL Final   Glucose reference range applies only to samples taken after fasting for at least 8 hours.   BUN 02/07/2024 16  6 - 20 mg/dL Final   Creatinine, Ser 02/07/2024 1.34 (H)  0.61 - 1.24 mg/dL Final   Calcium 44/01/4741 9.0  8.9 - 10.3 mg/dL Final   Total Protein 59/56/3875 6.4 (L)  6.5 - 8.1 g/dL Final   Albumin 64/33/2951  3.7  3.5 - 5.0 g/dL Final   AST 78/29/5621 28  15 - 41 U/L Final   ALT 02/07/2024 45 (H)  0 - 44 U/L Final   Alkaline Phosphatase 02/07/2024 47  38 - 126 U/L Final   Total Bilirubin 02/07/2024 0.6  0.0 - 1.2 mg/dL Final   GFR, Estimated 02/07/2024 >60  >60 mL/min Final   Comment: (NOTE) Calculated using the CKD-EPI Creatinine Equation (2021)    Anion gap 02/07/2024 9  5 - 15 Final   Performed at [facility], [address], [place], [place] [address]   WBC 02/07/2024 6.3  4.0 - 10.5 K/uL Final   RBC 02/07/2024 4.94  4.22 - 5.81 MIL/uL  Final   Hemoglobin 02/07/2024 14.0  13.0 - 17.0 g/dL Final   HCT 78/46/9629 42.2  39.0 - 52.0 % Final   MCV 02/07/2024 85.4  80.0 - 100.0 fL Final   MCH 02/07/2024 28.3  26.0 - 34.0 pg Final   MCHC 02/07/2024 33.2  30.0 - 36.0 g/dL Final   RDW 52/84/1324 15.7 (H)  11.5 - 15.5 % Final   Platelets 02/07/2024 196  150 - 400 K/uL Final   nRBC 02/07/2024 0.0  0.0 - 0.2 % Final   Performed at [facility], [address] [place], [place] [address]   Color, Urine 02/07/2024 YELLOW  YELLOW Final   APPearance 02/07/2024 CLEAR  CLEAR Final   Specific Gravity, Urine 02/07/2024 1.017  1.005 - 1.030 Final   pH 02/07/2024 7.0  5.0 - 8.0 Final   Glucose, UA 02/07/2024 NEGATIVE  NEGATIVE mg/dL Final   Hgb urine dipstick 02/07/2024 NEGATIVE  NEGATIVE Final   Bilirubin Urine 02/07/2024 NEGATIVE  NEGATIVE Final   Ketones, ur 02/07/2024 NEGATIVE  NEGATIVE mg/dL Final   Protein, ur 72/53/6644 NEGATIVE  NEGATIVE mg/dL Final   Nitrite 03/47/4259 NEGATIVE  NEGATIVE Final   Leukocytes,Ua 02/07/2024 NEGATIVE  NEGATIVE Final   Performed at [facility], [address] [place], [place] [address]   Opiates 02/07/2024 NONE DETECTED  NONE DETECTED Final   Cocaine 02/07/2024 NONE DETECTED  NONE DETECTED Final   Benzodiazepines 02/07/2024 NONE DETECTED  NONE DETECTED Final   Amphetamines 02/07/2024 NONE DETECTED  NONE DETECTED Final   Tetrahydrocannabinol 02/07/2024 NONE DETECTED  NONE DETECTED Final   Barbiturates 02/07/2024 NONE DETECTED  NONE DETECTED Final   Comment: (NOTE) DRUG SCREEN FOR MEDICAL PURPOSES ONLY.  IF CONFIRMATION IS NEEDED FOR ANY PURPOSE, NOTIFY LAB WITHIN 5 DAYS.  LOWEST DETECTABLE LIMITS FOR URINE DRUG SCREEN Drug Class                     Cutoff (ng/mL) Amphetamine and metabolites    1000 Barbiturate and metabolites    200 Benzodiazepine                 200 Opiates and metabolites        300 Cocaine and metabolites        300 THC                            50 Performed  at [facility], [address], [place], [place] [address]    Troponin I (High Sensitivity) 02/07/2024 4  <18 ng/L Final   Comment: (NOTE) Elevated high sensitivity troponin I (hsTnI) values and significant  changes across serial measurements may suggest ACS but many other  chronic and  acute conditions are known to elevate hsTnI results.  Refer to the "Links" section for chest pain algorithms and additional  guidance. Performed at [facility], [address], [place], [place] [address]   Admission on 02/03/2024, Discharged on 02/03/2024  Component Date Value Ref Range Status   WBC 02/03/2024 8.8  4.0 - 10.5 K/uL Final   RBC 02/03/2024 4.89  4.22 - 5.81 MIL/uL Final   Hemoglobin 02/03/2024 13.7  13.0 - 17.0 g/dL Final   HCT 60/45/4098 41.9  39.0 - 52.0 % Final   MCV 02/03/2024 85.7  80.0 - 100.0 fL Final   MCH 02/03/2024 28.0  26.0 - 34.0 pg Final   MCHC 02/03/2024 32.7  30.0 - 36.0 g/dL Final   RDW 11/91/4782 15.9 (H)  11.5 - 15.5 % Final   Platelets 02/03/2024 149 (L)  150 - 400 K/uL Final   nRBC 02/03/2024 0.0  0.0 - 0.2 % Final   Neutrophils Relative % 02/03/2024 53  % Final   Neutro Abs 02/03/2024 4.7  1.7 - 7.7 K/uL Final   Lymphocytes Relative 02/03/2024 30  % Final   Lymphs Abs 02/03/2024 2.6  0.7 - 4.0 K/uL Final   Monocytes Relative 02/03/2024 9  % Final   Monocytes Absolute 02/03/2024 0.8  0.1 - 1.0 K/uL Final   Eosinophils Relative 02/03/2024 6  % Final   Eosinophils Absolute 02/03/2024 0.5  0.0 - 0.5 K/uL Final   Basophils Relative 02/03/2024 1  % Final   Basophils Absolute 02/03/2024 0.1  0.0 - 0.1 K/uL Final   Immature Granulocytes 02/03/2024 1  % Final   Abs Immature Granulocytes 02/03/2024 0.10 (H)  0.00 - 0.07 K/uL Final   Performed at [facility] [address], [place], [place] [address]   Sodium 02/03/2024 138  135 - 145 mmol/L Final   Potassium 02/03/2024 3.9  3.5 - 5.1 mmol/L Final   Chloride 02/03/2024 104  98 - 111  mmol/L Final   CO2 02/03/2024 24  22 - 32 mmol/L Final   Glucose, Bld 02/03/2024 117 (H)  70 - 99 mg/dL Final   Glucose reference range applies only to samples taken after fasting for at least 8 hours.   BUN 02/03/2024 19  6 - 20 mg/dL Final   Creatinine, Ser 02/03/2024 1.09  0.61 - 1.24 mg/dL Final   Calcium 30/86/5784 8.9  8.9 - 10.3 mg/dL Final   GFR, Estimated 02/03/2024 >60  >60 mL/min Final   Comment: (NOTE) Calculated using the CKD-EPI Creatinine Equation (2021)    Anion gap 02/03/2024 10  5 - 15 Final   Performed at [facility], [address] [place], [place] [address]   Troponin I (High Sensitivity) 02/03/2024 3  <18 ng/L Final   Comment: (NOTE) Elevated high sensitivity troponin I (hsTnI) values and significant  changes across serial measurements may suggest ACS but many other  chronic and acute conditions are known to elevate hsTnI results.  Refer to the "Links" section for chest pain algorithms and additional  guidance. Performed at [facility], [address] [place], [place] [address]   Admission on 01/15/2024, Discharged on 01/23/2024  Component Date Value Ref Range Status   Folate 01/16/2024 8.2  >5.9 ng/mL Final   Performed at [facility], [address], [place], [place] [address]   Hgb A1c MFr Bld 01/16/2024 5.4  4.8 - 5.6 % Final   Comment: (NOTE) Pre diabetes:  5.7%-6.4%  Diabetes:              >6.4%  Glycemic control for   <7.0% adults with diabetes    Mean Plasma Glucose 01/16/2024 108.28  mg/dL Final   Performed at [facility], [address] [place], [place] [address]   Cholesterol 01/16/2024 164  0 - 200 mg/dL Final   Triglycerides 87/56/4332 177 (H)  <150 mg/dL Final   HDL 95/18/8416 57  >40 mg/dL Final   Total CHOL/HDL Ratio 01/16/2024 2.9  RATIO Final   VLDL 01/16/2024 35  0 - 40 mg/dL Final   LDL Cholesterol 01/16/2024 72  0 - 99 mg/dL Final   Comment:        Total  Cholesterol/HDL:CHD Risk Coronary Heart Disease Risk Table                     Men   Women  1/2 Average Risk   3.4   3.3  Average Risk       5.0   4.4  2 X Average Risk   9.6   7.1  3 X Average Risk  23.4   11.0        Use the calculated Patient Ratio above and the CHD Risk Table to determine the patient's CHD Risk.        ATP III CLASSIFICATION (LDL):  <100     mg/dL   Optimal  606-301  mg/dL   Near or Above                    Optimal  130-159  mg/dL   Borderline  601-093  mg/dL   High  >235     mg/dL   Very High Performed at [facility], [address], [place], [place] [address]    RPR Ser Ql 01/16/2024 NON REACTIVE  NON REACTIVE Final   Performed at [facility], [address], [place], [place] [address]   TSH 01/16/2024 2.767  0.350 - 4.500 uIU/mL Final   Comment: Performed by a 3rd Generation assay with a functional sensitivity of <=0.01 uIU/mL. Performed at [facility], [address] [place], [place] [address]    Vitamin B-12 01/16/2024 410  180 - 914 pg/mL Final   Comment: (NOTE) This assay is not validated for testing neonatal or myeloproliferative syndrome specimens for Vitamin B12 levels. Performed at [facility], [address], [place], [place] [address]    Vit D, 25-Hydroxy 01/16/2024 9.67 (L)  30 - 100 ng/mL Final   Comment: (NOTE) Vitamin D deficiency has been defined by the Institute of Medicine  and an Endocrine Society practice guideline as a level of serum 25-OH  vitamin D less than 20 ng/mL (1,2). The Endocrine Society went on to  further define vitamin D insufficiency as a level between 21 and 29  ng/mL (2).  1. IOM (Institute of Medicine). 2010. Dietary reference intakes for  calcium and D. [place]: [employer]. 2. Holick MF, Binkley Worton, Bischoff-Ferrari HA, et al. Evaluation,  treatment, and prevention of vitamin D deficiency: an Endocrine  Society clinical  practice guideline, JCEM. 2011 Jul; 96(7): 1911-30.  Performed at [facility], [address] [place], [place] [address]    WBC 01/20/2024 14.5 (H)  4.0 - 10.5 K/uL Final   RBC 01/20/2024 5.13  4.22 - 5.81 MIL/uL Final   Hemoglobin 01/20/2024 14.4  13.0 -  17.0 g/dL Final   HCT 95/62/1308 44.4  39.0 - 52.0 % Final   MCV 01/20/2024 86.5  80.0 - 100.0 fL Final   MCH 01/20/2024 28.1  26.0 - 34.0 pg Final   MCHC 01/20/2024 32.4  30.0 - 36.0 g/dL Final   RDW 65/78/4696 16.0 (H)  11.5 - 15.5 % Final   Platelets 01/20/2024 198  150 - 400 K/uL Final   nRBC 01/20/2024 0.0  0.0 - 0.2 % Final   Neutrophils Relative % 01/20/2024 77  % Final   Neutro Abs 01/20/2024 11.2 (H)  1.7 - 7.7 K/uL Final   Lymphocytes Relative 01/20/2024 12  % Final   Lymphs Abs 01/20/2024 1.7  0.7 - 4.0 K/uL Final   Monocytes Relative 01/20/2024 7  % Final   Monocytes Absolute 01/20/2024 0.9  0.1 - 1.0 K/uL Final   Eosinophils Relative 01/20/2024 2  % Final   Eosinophils Absolute 01/20/2024 0.2  0.0 - 0.5 K/uL Final   Basophils Relative 01/20/2024 0  % Final   Basophils Absolute 01/20/2024 0.0  0.0 - 0.1 K/uL Final   Immature Granulocytes 01/20/2024 2  % Final   Abs Immature Granulocytes 01/20/2024 0.35 (H)  0.00 - 0.07 K/uL Final   Performed at [facility], [address] [place], [place] [address]   Sodium 01/20/2024 139  135 - 145 mmol/L Final   Potassium 01/20/2024 3.8  3.5 - 5.1 mmol/L Final   Chloride 01/20/2024 101  98 - 111 mmol/L Final   CO2 01/20/2024 25  22 - 32 mmol/L Final   Glucose, Bld 01/20/2024 97  70 - 99 mg/dL Final   Glucose reference range applies only to samples taken after fasting for at least 8 hours.   BUN 01/20/2024 10  6 - 20 mg/dL Final   Creatinine, Ser 01/20/2024 0.83  0.61 - 1.24 mg/dL Final   Calcium 41/32/4401 9.4  8.9 - 10.3 mg/dL Final   Total Protein 02/72/5366 7.3  6.5 - 8.1 g/dL Final   Albumin 44/01/4741 4.2  3.5 - 5.0 g/dL Final   AST 59/56/3875 71  (H)  15 - 41 U/L Final   ALT 01/20/2024 109 (H)  0 - 44 U/L Final   Alkaline Phosphatase 01/20/2024 52  38 - 126 U/L Final   Total Bilirubin 01/20/2024 0.5  0.0 - 1.2 mg/dL Final   GFR, Estimated 01/20/2024 >60  >60 mL/min Final   Comment: (NOTE) Calculated using the CKD-EPI Creatinine Equation (2021)    Anion gap 01/20/2024 13  5 - 15 Final   Performed at [facility], [address], [place], [place] [address]   Lipase 01/20/2024 27  11 - 51 U/L Final   Performed at [facility], [address] [place], [place] [address]   Color, Urine 01/20/2024 STRAW (A)  YELLOW Final   APPearance 01/20/2024 CLEAR  CLEAR Final   Specific Gravity, Urine 01/20/2024 >1.046 (H)  1.005 - 1.030 Final   pH 01/20/2024 5.0  5.0 - 8.0 Final   Glucose, UA 01/20/2024 NEGATIVE  NEGATIVE mg/dL Final   Hgb urine dipstick 01/20/2024 NEGATIVE  NEGATIVE Final   Bilirubin Urine 01/20/2024 NEGATIVE  NEGATIVE Final   Ketones, ur 01/20/2024 NEGATIVE  NEGATIVE mg/dL Final   Protein, ur 41/66/0630 NEGATIVE  NEGATIVE mg/dL Final   Nitrite 16/11/930 NEGATIVE  NEGATIVE Final   Leukocytes,Ua 01/20/2024 NEGATIVE  NEGATIVE Final   Performed at [facility], [address] [place], [place] [address]  Prothrombin Time 01/20/2024 12.3  11.4 - 15.2 seconds Final   INR 01/20/2024 0.9  0.8 - 1.2 Final   Comment: (NOTE) INR goal varies based on device and disease states. Performed at [facility], [address], [place], [place] [address]    Valproic Acid Lvl 01/23/2024 105 (H)  50.0 - 100.0 ug/mL Final   Performed at [facility], [address] [place], [place] [address]   WBC 01/23/2024 6.3  4.0 - 10.5 K/uL Final   RBC 01/23/2024 5.04  4.22 - 5.81 MIL/uL Final   Hemoglobin 01/23/2024 14.0  13.0 - 17.0 g/dL Final   HCT 09/81/1914 44.3  39.0 - 52.0 % Final   MCV 01/23/2024 87.9  80.0 - 100.0 fL Final   MCH 01/23/2024 27.8  26.0  - 34.0 pg Final   MCHC 01/23/2024 31.6  30.0 - 36.0 g/dL Final   RDW 78/29/5621 16.0 (H)  11.5 - 15.5 % Final   Platelets 01/23/2024 200  150 - 400 K/uL Final   nRBC 01/23/2024 0.0  0.0 - 0.2 % Final   Neutrophils Relative % 01/23/2024 43  % Final   Neutro Abs 01/23/2024 2.7  1.7 - 7.7 K/uL Final   Lymphocytes Relative 01/23/2024 40  % Final   Lymphs Abs 01/23/2024 2.5  0.7 - 4.0 K/uL Final   Monocytes Relative 01/23/2024 6  % Final   Monocytes Absolute 01/23/2024 0.4  0.1 - 1.0 K/uL Final   Eosinophils Relative 01/23/2024 8  % Final   Eosinophils Absolute 01/23/2024 0.5  0.0 - 0.5 K/uL Final   Basophils Relative 01/23/2024 1  % Final   Basophils Absolute 01/23/2024 0.0  0.0 - 0.1 K/uL Final   Immature Granulocytes 01/23/2024 2  % Final   Abs Immature Granulocytes 01/23/2024 0.13 (H)  0.00 - 0.07 K/uL Final   Performed at [facility], [address] [place], [place] [address]   Sodium 01/23/2024 140  135 - 145 mmol/L Final   Potassium 01/23/2024 4.0  3.5 - 5.1 mmol/L Final   Chloride 01/23/2024 104  98 - 111 mmol/L Final   CO2 01/23/2024 28  22 - 32 mmol/L Final   Glucose, Bld 01/23/2024 72  70 - 99 mg/dL Final   Glucose reference range applies only to samples taken after fasting for at least 8 hours.   BUN 01/23/2024 12  6 - 20 mg/dL Final   Creatinine, Ser 01/23/2024 0.79  0.61 - 1.24 mg/dL Final   Calcium 78/46/9629 9.4  8.9 - 10.3 mg/dL Final   Total Protein 52/84/1324 6.5  6.5 - 8.1 g/dL Final   Albumin 40/08/2724 3.7  3.5 - 5.0 g/dL Final   AST 36/64/4034 24  15 - 41 U/L Final   ALT 01/23/2024 51 (H)  0 - 44 U/L Final   Alkaline Phosphatase 01/23/2024 50  38 - 126 U/L Final   Total Bilirubin 01/23/2024 0.4  0.0 - 1.2 mg/dL Final   GFR, Estimated 01/23/2024 >60  >60 mL/min Final   Comment: (NOTE) Calculated using the CKD-EPI Creatinine Equation (2021)    Anion gap 01/23/2024 8  5 - 15 Final   Performed at [facility], [address] [place], [place] [address]  Admission on 01/14/2024, Discharged on 01/15/2024  Component Date Value Ref Range Status   Sodium 01/14/2024 142  135 - 145 mmol/L Final   Potassium 01/14/2024 3.6  3.5 - 5.1 mmol/L Final   Chloride 01/14/2024 107  98 - 111 mmol/L Final   CO2 01/14/2024 27  22 - 32 mmol/L Final   Glucose, Bld 01/14/2024 100 (H)  70 - 99 mg/dL Final   Glucose reference range applies only to samples taken after fasting for at least 8 hours.   BUN 01/14/2024 18  6 - 20 mg/dL Final   Creatinine, Ser 01/14/2024 0.86  0.61 - 1.24 mg/dL Final   Calcium 57/84/6962 9.1  8.9 - 10.3 mg/dL Final   Total Protein 95/28/4132 7.2  6.5 - 8.1 g/dL Final   Albumin 44/11/270 4.2  3.5 - 5.0 g/dL Final   AST 53/66/4403 32  15 - 41 U/L Final   ALT 01/14/2024 36  0 - 44 U/L Final   Alkaline Phosphatase 01/14/2024 53  38 - 126 U/L Final   Total Bilirubin 01/14/2024 0.7  0.0 - 1.2 mg/dL Final   GFR, Estimated 01/14/2024 >60  >60 mL/min Final   Comment: (NOTE) Calculated using the CKD-EPI Creatinine Equation (2021)    Anion gap 01/14/2024 8  5 - 15 Final   Performed at [facility], [address] [place], [place] [address]   Alcohol, Ethyl (B) 01/14/2024 <10  <10 mg/dL Final   Comment: (NOTE) Lowest detectable limit for serum alcohol is 10 mg/dL.  For medical purposes only. Performed at [facility], [address] [place], [place] [address]    Salicylate Lvl 01/14/2024 <7.0 (L)  7.0 - 30.0 mg/dL Final   Performed at [facility], [address], [place], [place] [address]   Acetaminophen (Tylenol), Serum 01/14/2024 <10 (L)  10 - 30 ug/mL Final   Comment: (NOTE) Therapeutic concentrations vary significantly. A range of 10-30 ug/mL  may be an effective concentration for many patients. However, some  are best treated at concentrations outside of this range. Acetaminophen concentrations >150 ug/mL at 4 hours after ingestion  and >50  ug/mL at 12 hours after ingestion are often associated with  toxic reactions.  Performed at [facility], [address], [place], [place] [address]    WBC 01/14/2024 12.3 (H)  4.0 - 10.5 K/uL Final   RBC 01/14/2024 5.25  4.22 - 5.81 MIL/uL Final   Hemoglobin 01/14/2024 14.6  13.0 - 17.0 g/dL Final   HCT 88/41/6606 45.3  39.0 - 52.0 % Final   MCV 01/14/2024 86.3  80.0 - 100.0 fL Final   MCH 01/14/2024 27.8  26.0 - 34.0 pg Final   MCHC 01/14/2024 32.2  30.0 - 36.0 g/dL Final   RDW 30/16/0109 15.4  11.5 - 15.5 % Final   Platelets 01/14/2024 218  150 - 400 K/uL Final   nRBC 01/14/2024 0.0  0.0 - 0.2 % Final   Performed at [facility], [address], [place], [place] [address]   Opiates 01/14/2024 NONE DETECTED  NONE DETECTED Final   Cocaine 01/14/2024 NONE DETECTED  NONE DETECTED Final   Benzodiazepines 01/14/2024 NONE DETECTED  NONE DETECTED Final   Amphetamines 01/14/2024 NONE DETECTED  NONE DETECTED Final   Tetrahydrocannabinol 01/14/2024 NONE DETECTED  NONE DETECTED Final   Barbiturates 01/14/2024 NONE DETECTED  NONE DETECTED Final   Comment: (NOTE) DRUG SCREEN FOR MEDICAL PURPOSES ONLY.  IF CONFIRMATION IS NEEDED FOR ANY PURPOSE, NOTIFY LAB WITHIN 5 DAYS.  LOWEST DETECTABLE LIMITS FOR URINE DRUG SCREEN Drug Class                     Cutoff (ng/mL) Amphetamine and  metabolites    1000 Barbiturate and metabolites    200 Benzodiazepine                 200 Opiates and metabolites        300 Cocaine and metabolites        300 THC                            50 Performed at [facility], [address], [place], [place] [address]     Blood Alcohol level:  Lab Results  Component Value Date   [facility] <10 02/08/2024   ETH <10 01/14/2024    Metabolic Disorder Labs: Lab Results  Component Value Date   HGBA1C 5.4 01/16/2024   MPG 108.28 01/16/2024   No results found for: "PROLACTIN" Lab Results  Component Value Date    CHOL 164 01/16/2024   TRIG 177 (H) 01/16/2024   HDL 57 01/16/2024   CHOLHDL 2.9 01/16/2024   VLDL 35 01/16/2024   LDLCALC 72 01/16/2024    Therapeutic Lab Levels: No results found for: "LITHIUM" Lab Results  Component Value Date   VALPROATE <10 (L) 02/09/2024   VALPROATE 105 (H) 01/23/2024   No results found for: "CBMZ"  Physical Findings   AUDIT    Flowsheet Row ED from 02/09/2024 in [facility] ED to Hosp[facility]Admission (Discharged[facility]  Alcohol Use Disorder Identification Test Final Score (AUDIT) 0 0      Flowsheet Row ED from 02/09/2024 in [facility] ED from 02/08/2024 in [facility] Emergency Department at [facility] ED from 02/07/2024 in [facility] Department at [facility]  C-SSRS RISK CATEGORY High Risk High Risk Error: Question 6 not populated        Musculoskeletal  Strength & Muscle Tone: within normal limits Gait & Station: normal Patient leans: N/A  Psychiatric Specialty Exam  Presentation  General Appearance:  Fairly Groomed  Eye Contact: Good  Speech: Pressured; Clear and Coherent  Speech Volume: Normal  Handedness: -- (not assessed)   Mood and Affect  Mood: -- ("People are after me")  Affect: -- (Flat)   Thought Process  Thought Processes: Disorganized  Descriptions of Associations:Tangential  Orientation:None  Thought Content:Scattered; Paranoid Ideation  Diagnosis of Schizophrenia or Schizoaffective disorder in past: No  Duration of Psychotic Symptoms: N/A   Hallucinations:Hallucinations: None  Ideas of Reference:None  Suicidal Thoughts:Suicidal Thoughts: No  Homicidal Thoughts:Homicidal Thoughts: No   Sensorium  Memory: Immediate Fair; Recent Fair; Remote Fair  Judgment: Poor  Insight: Poor   Executive Functions  Concentration: Fair  Attention  Span: Fair  Recall: Fair  Fund of Knowledge: Fair  Language: Fair   Psychomotor Activity  Psychomotor Activity:Psychomotor Activity: Normal   Assets  Assets: Desire for Improvement; Resilience; Communication Skills   Sleep  Sleep:Sleep: Fair    Physical Exam  Physical Exam Vitals and nursing note reviewed.  Constitutional:      General: He is not in acute distress.    Appearance: He is not ill-appearing.  HENT:     Head: Normocephalic and atraumatic.  Eyes:     Extraocular Movements: Extraocular movements intact.     Conjunctiva/sclera: Conjunctivae normal.  Pulmonary:     Effort: Pulmonary effort is normal. No respiratory distress.  Musculoskeletal:        General: Normal range of motion.  Skin:  General: Skin is warm and dry.    Review of Systems  All other systems reviewed and are negative.  Blood pressure 117/76, pulse 79, temperature 97.9 F (36.6 C), temperature source Oral, resp. rate 18, SpO2 100%. There is no height or weight on file to calculate BMI.  Treatment Plan Summary: Daily contact with patient to assess and evaluate symptoms and progress in treatment and Medication management Status: Voluntary   Psychiatric Diagnoses and Treatment:  Schizoaffective disorder, bipolar type Restart home Risperdal 6 mg nightly Restart home Cogentin 1 mg nightly Restart home Depakote ER 500 mg nightly VPA level on 02/09/2024 is subtherapeutic Start Zoloft 50 mg daily for mood   Medical Issues Being Addressed: No  Other PRNs: acetaminophen, 650 mg, Q6H PRN alum & mag hydroxide-simeth, 30 mL, Q4H PRN haloperidol, 5 mg, TID PRN  And diphenhydrAMINE, 50 mg, TID PRN hydrOXYzine, 25 mg, TID PRN magnesium hydroxide, 30 mL, Daily PRN OLANZapine, 10 mg, TID PRN OLANZapine, 5 mg, TID PRN OLANZapine zydis, 5 mg, TID PRN traZODone, 50 mg, QHS PRN   Other Labs/Imaging Reviewed and are unremarkable  EKG on 3/17: QTc 395  Disposition: patient  requires high level of care due to ongoign psychosis and has been recommended to transfer to [facility] once beds are available.   Signed: Lorri Frederick, MD 02/10/2024 3:03 PM

## 2024-02-10 NOTE — ED Notes (Signed)
 Patient sitting in bedroom, calm and composed. No acute distress noted. No concerns voiced. Informed patient to notify staff with any needs or assistance. Patient verbalized understanding or agreement. Safety checks in place per facility policy.

## 2024-02-11 ENCOUNTER — Encounter (HOSPITAL_COMMUNITY): Payer: Self-pay | Admitting: Student in an Organized Health Care Education/Training Program

## 2024-02-11 ENCOUNTER — Other Ambulatory Visit: Payer: Self-pay

## 2024-02-11 ENCOUNTER — Inpatient Hospital Stay (HOSPITAL_COMMUNITY)
Admission: AD | Admit: 2024-02-11 | Discharge: 2024-02-18 | DRG: 885 | Disposition: A | Discharge status: Home / Self Care | Source: Intra-hospital | Attending: Psychiatry | Admitting: Psychiatry

## 2024-02-11 DIAGNOSIS — Z91199 Patient's noncompliance with other medical treatment and regimen due to unspecified reason: Secondary | ICD-10-CM

## 2024-02-11 DIAGNOSIS — Z79899 Other long term (current) drug therapy: Secondary | ICD-10-CM | POA: Diagnosis not present

## 2024-02-11 DIAGNOSIS — R45851 Suicidal ideations: Secondary | ICD-10-CM | POA: Diagnosis present

## 2024-02-11 DIAGNOSIS — F22 Delusional disorders: Secondary | ICD-10-CM | POA: Diagnosis present

## 2024-02-11 DIAGNOSIS — K5900 Constipation, unspecified: Secondary | ICD-10-CM | POA: Diagnosis present

## 2024-02-11 DIAGNOSIS — F40240 Claustrophobia: Secondary | ICD-10-CM | POA: Diagnosis present

## 2024-02-11 DIAGNOSIS — F429 Obsessive-compulsive disorder, unspecified: Secondary | ICD-10-CM | POA: Diagnosis present

## 2024-02-11 DIAGNOSIS — R4681 Obsessive-compulsive behavior: Secondary | ICD-10-CM | POA: Diagnosis present

## 2024-02-11 DIAGNOSIS — Z811 Family history of alcohol abuse and dependence: Secondary | ICD-10-CM

## 2024-02-11 DIAGNOSIS — Z560 Unemployment, unspecified: Secondary | ICD-10-CM

## 2024-02-11 DIAGNOSIS — F410 Panic disorder [episodic paroxysmal anxiety] without agoraphobia: Secondary | ICD-10-CM | POA: Diagnosis present

## 2024-02-11 DIAGNOSIS — E559 Vitamin D deficiency, unspecified: Secondary | ICD-10-CM | POA: Diagnosis present

## 2024-02-11 DIAGNOSIS — F250 Schizoaffective disorder, bipolar type: Secondary | ICD-10-CM | POA: Diagnosis present

## 2024-02-11 DIAGNOSIS — Z818 Family history of other mental and behavioral disorders: Secondary | ICD-10-CM

## 2024-02-11 DIAGNOSIS — F5104 Psychophysiologic insomnia: Secondary | ICD-10-CM | POA: Diagnosis present

## 2024-02-11 DIAGNOSIS — Z91148 Patient's other noncompliance with medication regimen for other reason: Secondary | ICD-10-CM

## 2024-02-11 LAB — VALPROIC ACID LEVEL: Valproic Acid Lvl: 22 ug/mL — ABNORMAL LOW (ref 50.0–100.0)

## 2024-02-11 MED ORDER — DIPHENHYDRAMINE HCL 50 MG/ML IJ SOLN: 50.0000 mg | Freq: Three times a day (TID) | INTRAMUSCULAR | Status: DC | PRN

## 2024-02-11 MED ORDER — HYDROXYZINE HCL 25 MG PO TABS
25.0000 mg | ORAL_TABLET | Freq: Three times a day (TID) | ORAL | Status: DC | PRN
  Filled 2024-02-11: qty 1
  Filled 2024-02-11: qty 10

## 2024-02-11 MED ORDER — LORAZEPAM 2 MG/ML IJ SOLN: 2.0000 mg | Freq: Three times a day (TID) | INTRAMUSCULAR | Status: DC | PRN

## 2024-02-11 MED ORDER — HALOPERIDOL LACTATE 5 MG/ML IJ SOLN: 5.0000 mg | Freq: Three times a day (TID) | INTRAMUSCULAR | Status: DC | PRN

## 2024-02-11 MED ORDER — MAGNESIUM HYDROXIDE 400 MG/5ML PO SUSP
30.0000 mL | Freq: Every day | ORAL | Status: DC | PRN
  Administered 2024-02-14 – 2024-02-16 (×3): 30 mL via ORAL
  Filled 2024-02-11 (×3): qty 30

## 2024-02-11 MED ORDER — TRAZODONE HCL 50 MG PO TABS: 50.0000 mg | ORAL_TABLET | Freq: Every evening | ORAL | Status: DC | PRN

## 2024-02-11 MED ORDER — SERTRALINE HCL 50 MG PO TABS
50.0000 mg | ORAL_TABLET | Freq: Every day | ORAL | Status: DC
  Administered 2024-02-12 – 2024-02-18 (×7): 50 mg via ORAL
  Filled 2024-02-11 (×6): qty 1
  Filled 2024-02-11: qty 7
  Filled 2024-02-11 (×3): qty 1

## 2024-02-11 MED ORDER — ALUM & MAG HYDROXIDE-SIMETH 200-200-20 MG/5ML PO SUSP: 30.0000 mL | ORAL | Status: DC | PRN

## 2024-02-11 MED ORDER — BUSPIRONE HCL 10 MG PO TABS
10.0000 mg | ORAL_TABLET | Freq: Three times a day (TID) | ORAL | Status: DC
  Administered 2024-02-11 – 2024-02-18 (×21): 10 mg via ORAL
  Filled 2024-02-11 (×2): qty 1
  Filled 2024-02-11: qty 2
  Filled 2024-02-11 (×4): qty 1
  Filled 2024-02-11: qty 21
  Filled 2024-02-11 (×8): qty 1
  Filled 2024-02-11 (×2): qty 2
  Filled 2024-02-11: qty 1
  Filled 2024-02-11 (×2): qty 21
  Filled 2024-02-11 (×8): qty 1

## 2024-02-11 MED ORDER — HALOPERIDOL 5 MG PO TABS: 5.0000 mg | ORAL_TABLET | Freq: Three times a day (TID) | ORAL | Status: DC | PRN

## 2024-02-11 MED ORDER — DIVALPROEX SODIUM ER 500 MG PO TB24: 500.0000 mg | ORAL_TABLET | Freq: Every day | ORAL | Status: DC

## 2024-02-11 MED ORDER — BUSPIRONE HCL 10 MG PO TABS: 10.0000 mg | ORAL_TABLET | Freq: Three times a day (TID) | ORAL | Status: DC

## 2024-02-11 MED ORDER — BENZTROPINE MESYLATE 1 MG PO TABS: 1.0000 mg | ORAL_TABLET | Freq: Every day | ORAL | Status: DC

## 2024-02-11 MED ORDER — DIVALPROEX SODIUM ER 500 MG PO TB24
500.0000 mg | ORAL_TABLET | Freq: Every day | ORAL | Status: DC
  Administered 2024-02-11 – 2024-02-14 (×4): 500 mg via ORAL
  Filled 2024-02-11 (×6): qty 1

## 2024-02-11 MED ORDER — BENZTROPINE MESYLATE 1 MG PO TABS
1.0000 mg | ORAL_TABLET | Freq: Every day | ORAL | Status: DC
  Administered 2024-02-11 – 2024-02-15 (×5): 1 mg via ORAL
  Filled 2024-02-11 (×8): qty 1

## 2024-02-11 MED ORDER — SERTRALINE HCL 50 MG PO TABS: 50.0000 mg | ORAL_TABLET | Freq: Every day | ORAL | Status: DC

## 2024-02-11 MED ORDER — RISPERIDONE 3 MG PO TABS
6.0000 mg | ORAL_TABLET | Freq: Every day | ORAL | Status: DC
  Administered 2024-02-11 – 2024-02-17 (×7): 6 mg via ORAL
  Filled 2024-02-11 (×3): qty 2
  Filled 2024-02-11: qty 14
  Filled 2024-02-11 (×2): qty 2
  Filled 2024-02-11: qty 6
  Filled 2024-02-11 (×5): qty 2

## 2024-02-11 MED ORDER — ACETAMINOPHEN 325 MG PO TABS: 650.0000 mg | ORAL_TABLET | Freq: Four times a day (QID) | ORAL | Status: DC | PRN

## 2024-02-11 MED ORDER — DIPHENHYDRAMINE HCL 25 MG PO CAPS: 50.0000 mg | ORAL_CAPSULE | Freq: Three times a day (TID) | ORAL | Status: DC | PRN

## 2024-02-11 MED ORDER — RISPERIDONE 3 MG PO TABS: 6.0000 mg | ORAL_TABLET | Freq: Every day | ORAL | Status: DC

## 2024-02-11 MED ORDER — TRAZODONE HCL 50 MG PO TABS
50.0000 mg | ORAL_TABLET | Freq: Every evening | ORAL | Status: DC | PRN
  Filled 2024-02-11 (×2): qty 1

## 2024-02-11 MED ORDER — HALOPERIDOL LACTATE 5 MG/ML IJ SOLN: 10.0000 mg | Freq: Three times a day (TID) | INTRAMUSCULAR | Status: DC | PRN

## 2024-02-11 NOTE — Progress Notes (Signed)
   02/11/24 2200  Psych Admission Type (Psych Patients Only)  Admission Status Voluntary  Psychosocial Assessment  Patient Complaints Suspiciousness  Eye Contact Brief  Facial Expression Anxious  Affect Preoccupied  Speech Rapid  Interaction Assertive;Cautious  Motor Activity Other (Comment) (WNL)  Appearance/Hygiene Unremarkable  Behavior Characteristics Anxious  Mood Suspicious;Apprehensive  Thought Chartered certified accountant of ideas  Content Paranoia  Delusions None reported or observed  Perception WDL  Hallucination None reported or observed  Judgment Impaired  Confusion None  Danger to Self  Current suicidal ideation? Denies  Description of Suicide Plan None  Agreement Not to Harm Self Yes  Description of Agreement Verbal Contract  Danger to Others  Danger to Others None reported or observed

## 2024-02-11 NOTE — Plan of Care (Signed)
   Problem: Education: Goal: Knowledge of [facility] Education information/materials will improve Outcome: Progressing Goal: Emotional status will improve Outcome: Progressing Goal: Mental status will improve Outcome: Progressing Goal: Verbalization of understanding the information provided will improve Outcome: Progressing

## 2024-02-11 NOTE — ED Notes (Addendum)
 Pt request writer to write down all the medications he took and states what the medications are used for. Writer had them printed for pt.

## 2024-02-11 NOTE — ED Notes (Signed)
 Patient in bedroom, calm and composed. Patient awaiting transport to [facility] for continuation of care. No acute distress noted. No concerns voiced. Informed patient to notify staff with any needs or assistance. Patient verbalized understanding or agreement. Safety checks in place per facility policy.

## 2024-02-11 NOTE — ED Notes (Signed)
 Patient is sleeping. Respirations equal and unlabored, skin warm and dry. No change in assessment or acuity. Routine safety checks conducted according to facility protocol. Will continue to monitor for safety.

## 2024-02-11 NOTE — Care Management (Signed)
 [facility] Care Management   Writer contacted Berniece Salines, [facility] [phone] and spoke to [place].  Per Windell Moulding the faxed documentation requested was received yesterday.  Writer informed the MD.

## 2024-02-11 NOTE — Plan of Care (Signed)
  Problem: Education: Goal: Mental status will improve Outcome: Not Progressing   Problem: Activity: Goal: Interest or engagement in activities will improve Outcome: Not Progressing

## 2024-02-11 NOTE — ED Provider Notes (Addendum)
 FBC/OBS ASAP Discharge Summary  Date and Time: 02/11/2024 2:31 PM  Name: Rodney Thomas  MRN:  [id number]   Discharge Diagnoses:  Final diagnoses:  Schizoaffective disorder, bipolar type [facility])   Reason for admission: Rodney Thomas is a 33 yo male who presented himself to the ED on 02/08/2024 for severe paranoia in the setting of medication noncompliance.His PPHx is significant for schizoaffective disorder, bipolar type, chronic insomnia, OCD, vitamin D deficiency, paranoia.   Stay Summary:  The patient was initially admitted to the [facility] on 02/09/2024 with concerns of paranoia in the setting of medication non adherence.Initial assessment revealed the patient had pressured speech, poor eye contact, and tangential thinking during the interview. The patient was also noted to be paranoid, expressing concerns that someone was watching him, and fears the food may be poisoned. UDS  and EtOH were both negative.   Throughout the hospitalization, the patient was closely monitored and managed by the psychiatric team. Pharmacologic interventions included continuation of medications on arrival, which the patient reported benefiting from. His speech was noted to be less pressured on evaluation today. He continues to endorse paranoid ideations today and was in agreement to transfer to [facility] for further management.   The patient participated in limited therapeutic interventions due to ongoing psychotic symptoms, and was unable to engage meaningfully in psychotherapy or other coping strategies. Despite optimization of medication regimens and symptom management strategies, the patient continues to exhibit persistent psychotic symptoms.  Given the lack of adequate response to current treatment and the need for a more intensive psychiatric intervention, the decision was made to transfer the patient to the [facility] for a higher level of care. The patient was informed of the transfer plan, and has agreed to go voluntarily.    Regarding patient's upcoming court hearing, collateral obtained from patient's mother Rodney Thomas [phone]) on 02/10/2024 who confirmed patient had a court hearing set for 9 AM on 02/11/2024. The patient's mother informed me that she has been in contact with the patient's lawyer and she provided the contact details so the patient's hospitalization could be verified and faxed to the lawyer's office to request a delay in the court date.  Per LCSW on day of discharge: "Writer contacted Berniece Salines, [facility] [phone] and spoke to [place]. Per Windell Moulding the faxed documentation requested was received yesterday."  Discharge Medications: Depakote ER 500 mg nightly --VPA level obtained at [facility] on 02/09/2024 subtherapeutic <10 Risperdal 6 mg nightly --no EPS noted on day of discharge Zoloft 50 mg daily Cogentin 1 mg nightly for EPS prophylaxis BuSpar 10 mg 3 times daily   Total Time spent with patient: 1.5 hours  Past Psychiatric History:  Patient has no current outpatient follow-up for medication management or therapy. --[facility] admission in February 2025 for 8 days (2/21-3/1) --admitted voluntarily for psychosis.  He was diagnosed with schizoaffective disorder, bipolar type, chronic insomnia, and OCD at that time.  Discharge medications included: Depakote ER 2000 mg nightly, Risperdal 6 mg nightly, trazodone 50 mg nightly. Patient has reported multiple prior psychiatric hospitalizations in past, none identified on chart review. Past Medical History:  No current PCP Dx: Diverticulitis resolved s/p antibiotics Family Medical & Psychiatric History:  Per chart review --" He reports that his mother has depression and that his brother has OCD like behavior, and is a "raging alcoholic". " Social History:  Lives in [place] with his mother. Patient has pending court date related to charge for communicating a threat. Patient reports he served a 45-day jail sentence  after being arrested for communicating  threats to his child's mother. Children: 67-year-old daughter  Current Medications:  No current facility-administered medications for this encounter.   Current Outpatient Medications  Medication Sig Dispense Refill   benztropine (COGENTIN) 1 MG tablet Take 1 tablet (1 mg total) by mouth at bedtime.     busPIRone (BUSPAR) 10 MG tablet Take 1 tablet (10 mg total) by mouth 3 (three) times daily.     divalproex (DEPAKOTE ER) 500 MG 24 hr tablet Take 1 tablet (500 mg total) by mouth at bedtime.     risperiDONE (RISPERDAL) 3 MG tablet Take 2 tablets (6 mg total) by mouth at bedtime.     [START ON 02/12/2024] sertraline (ZOLOFT) 50 MG tablet Take 1 tablet (50 mg total) by mouth daily.     traZODone (DESYREL) 50 MG tablet Take 1 tablet (50 mg total) by mouth at bedtime as needed for sleep.      PTA Medications:  PTA Medications  Medication Sig   benztropine (COGENTIN) 1 MG tablet Take 1 tablet (1 mg total) by mouth at bedtime.   busPIRone (BUSPAR) 10 MG tablet Take 1 tablet (10 mg total) by mouth 3 (three) times daily.   divalproex (DEPAKOTE ER) 500 MG 24 hr tablet Take 1 tablet (500 mg total) by mouth at bedtime.   traZODone (DESYREL) 50 MG tablet Take 1 tablet (50 mg total) by mouth at bedtime as needed for sleep.   [START ON 02/12/2024] sertraline (ZOLOFT) 50 MG tablet Take 1 tablet (50 mg total) by mouth daily.   risperiDONE (RISPERDAL) 3 MG tablet Take 2 tablets (6 mg total) by mouth at bedtime.       02/11/2024   11:55 AM  Depression screen PHQ 2/9  Decreased Interest 1  Down, Depressed, Hopeless 2  PHQ - 2 Score 3  Altered sleeping 1  Tired, decreased energy 1  Change in appetite 1  Feeling bad or failure about yourself  0  Trouble concentrating 1  Moving slowly or fidgety/restless 1  Suicidal thoughts 0  PHQ-9 Score 8  Difficult doing work/chores Somewhat difficult    Flowsheet Row ED from 02/09/2024 in [facility] ED from 02/08/2024 in [facility]  Emergency Department at [facility] ED from 02/07/2024 in [facility] Emergency Department at [facility]  C-SSRS RISK CATEGORY High Risk High Risk Error: Question 6 not populated       Musculoskeletal  Strength & Muscle Tone: within normal limits Gait & Station: normal Patient leans: N/A  Psychiatric Specialty Exam  Presentation  General Appearance:  Fairly Groomed  Eye Contact: Good  Speech: Pressured; Clear and Coherent  Speech Volume: Normal  Handedness: -- (not assessed)   Mood and Affect  Mood: -- ("People are after me")  Affect: -- (Flat)   Thought Process  Thought Processes: Less disorganzied  Descriptions of Associations:Tangential  Orientation:None  Thought Content:Scattered; Paranoid Ideation  Diagnosis of Schizophrenia or Schizoaffective disorder in past: No    Hallucinations:Hallucinations: None  Ideas of Reference:None  Suicidal Thoughts:Suicidal Thoughts: No  Homicidal Thoughts:Homicidal Thoughts: No   Sensorium  Memory: Immediate Fair; Recent Fair; Remote Fair  Judgment: Poor  Insight: Poor   Executive Functions  Concentration: Fair  Attention Span: Fair  Recall: Fair  Fund of Knowledge: Fair  Language: Fair   Psychomotor Activity  Psychomotor Activity: Psychomotor Activity: Normal   Assets  Assets: Desire for Improvement; Resilience; Communication Skills   Sleep  Sleep: Sleep: Fair  Nutritional Assessment (For OBS and FBC admissions only) Has the patient had a weight loss or gain of 10 pounds or more in the last 3 months?: No Has the patient had a decrease in food intake/or appetite?: No Does the patient have dental problems?: No Does the patient have eating habits or behaviors that may be indicators of an eating disorder including binging or inducing vomiting?: No Has the patient recently lost weight without trying?: 0 Has the patient been eating poorly because of a decreased  appetite?: 0 Malnutrition Screening Tool Score: 0    Physical Exam  Physical Exam Vitals and nursing note reviewed.  Constitutional:      General: He is not in acute distress.    Appearance: He is not ill-appearing.  HENT:     Head: Normocephalic and atraumatic.  Eyes:     Extraocular Movements: Extraocular movements intact.     Conjunctiva/sclera: Conjunctivae normal.  Pulmonary:     Effort: Pulmonary effort is normal. No respiratory distress.  Skin:    General: Skin is warm and dry.  Neurological:     General: No focal deficit present.    Review of Systems  All other systems reviewed and are negative.  Blood pressure 116/75, pulse 89, temperature 98.3 F (36.8 C), resp. rate 18, SpO2 96%. There is no height or weight on file to calculate BMI.  Demographic Factors:  Male and Adolescent or young adult  Loss Factors: Legal issues  Historical Factors: Impulsivity  Risk Reduction Factors:   Living with another person, especially a relative and Positive social support  Continued Clinical Symptoms:  More than one psychiatric diagnosis Unstable or Poor Therapeutic Relationship Previous Psychiatric Diagnoses and Treatments  Cognitive Features That Contribute To Risk:  None    Suicide Risk:  Moderate:  Frequent suicidal ideation with limited intensity, and duration, some specificity in terms of plans, no associated intent, good self-control, limited dysphoria/symptomatology, some risk factors present, and identifiable protective factors, including available and accessible social support.   Disposition: Transfer to [facility] for higher level of care   Signed: Lorri Frederick, MD 02/11/2024, 2:31 PM

## 2024-02-11 NOTE — Group Note (Signed)
 Date:  02/11/2024 Time:  8:45 PM  Group Topic/Focus:  Wrap-Up Group:   The focus of this group is to help patients review their daily goal of treatment and discuss progress on daily workbooks.    Participation Level:  Active  Participation Quality:  Appropriate and Attentive  Affect:  Appropriate  Cognitive:  Alert and Appropriate  Insight: Appropriate and Good  Engagement in Group:  Engaged  Modes of Intervention:  Discussion and Education  Additional Comments:  Pt attended and participated in wrap up group this evening and rated their day a 6/10. Pt stated that they were happy to be transferred to [facility] and reports being dissatisfied with their previous facility. Pt would like to receive medication to help with their paranoia and to meet more people to help them to feel less paranoid as well.   Chrisandra Netters 02/11/2024, 8:45 PM

## 2024-02-11 NOTE — Group Note (Unsigned)
 Group Topic: Balance in Life  Group Date: 02/11/2024 Start Time: 1200 End Time: 1220 Facilitators: Vonzell Schlatter B  Department: [facility]  Number of Participants: 8  Group Focus: coping skills and daily focus Treatment Modality:  Psychoeducation Interventions utilized were problem solving and support Purpose: regain self-worth and reinforce self-care   Name: Josealfredo Adkins Date of Birth: 1991/04/27  MR: [id number]    Level of Participation: {THERAPIES; PSYCH GROUP PARTICIPATION [id number]:19147} Quality of Participation: {THERAPIES; PSYCH QUALITY OF PARTICIPATION:23992} Interactions with others: {THERAPIES; PSYCH INTERACTIONS:23993} Mood/Affect: {THERAPIES; PSYCH MOOD/AFFECT:23994} Triggers (if applicable): *** Cognition: {THERAPIES; PSYCH COGNITION:23995} Progress: {THERAPIES; PSYCH PROGRESS:23997} Response: *** Plan: {THERAPIES; PSYCH WGNF:62130}  Patients Problems:  Patient Active Problem List   Diagnosis Date Noted   Suicidal ideations 02/10/2024   Acute paranoia (HCC) 02/09/2024   Vitamin D deficiency 01/17/2024   Schizoaffective disorder, bipolar type (HCC) 01/16/2024   Chronic insomnia 01/16/2024   Obsessive-compulsive behavior 01/16/2024   Paranoid disorder (HCC) 01/15/2024

## 2024-02-11 NOTE — Tx Team (Signed)
 Initial Treatment Plan 02/11/2024 4:49 PM Davius Goudeau [id number]    PATIENT STRESSORS: Health problems   Other: Paranoid     PATIENT STRENGTHS: Ability for insight  Average or above average intelligence  Communication skills  Motivation for treatment/growth  Supportive family/friends    PATIENT IDENTIFIED PROBLEMS: "Fear of being watched"  Paranoia  Anxiety  Ineffective coping skill               DISCHARGE CRITERIA:  Ability to meet basic life and health needs Adequate post-discharge living arrangements Motivation to continue treatment in a less acute level of care  PRELIMINARY DISCHARGE PLAN: Attend aftercare/continuing care group Outpatient therapy Return to previous living arrangement  PATIENT/FAMILY INVOLVEMENT: This treatment plan has been presented to and reviewed with the patient, Easter Schinke, and/or family member.  The patient and family have been given the opportunity to ask questions and make suggestions.  Clarene Critchley, RN 02/11/2024, 4:49 PM

## 2024-02-11 NOTE — ED Notes (Signed)
 Patient resting with eyes closed in no apparent acute distress. Respirations even and unlabored. Environment secured. Safety checks in place according to facility policy.

## 2024-02-11 NOTE — Progress Notes (Signed)
 Admission Note: Patient is a 33 year old male admitted to the unit voluntarily from [facility] for symptoms of paranoia.  Per report: patient believe that someone is trying to poison him or follow him around.  Patient appears anxious, suspicious and guarded.  Stated he is here to get rid of his fear of being watch.  Patient denies SI/HI, auditory and visual hallucinations.  Patient is alert and oriented x 4.  Admission plan of care reviewed, consent signed.  Skin and personal belongings completed.  Skin is dry and intact.  No contraband found.  Patient oriented to the unit, staff and room.  Routine safety checks initiated. Verbalizes understanding of unit rules/protocols.  Patient is safe on the unit.

## 2024-02-11 NOTE — ED Notes (Signed)
 Patient transferred to [facility] via [facility]. Patient stable and ambulatory, escorted by staff to back sallyport for transport to destination. Belongings from locker 11 returned complete and intact. Safety maintained.

## 2024-02-11 NOTE — ED Notes (Signed)
 Patient alert & oriented x4. Denies intent to harm self or others when asked. Denies A/VH. Patient denies any physical complaints when asked. No acute distress noted. Scheduled medications administered with no complications. Support and encouragement provided. Routine safety checks conducted per facility protocol. Encouraged patient to notify staff if any thoughts of harm towards self or others arise. Patient verbalizes understanding and agreement.

## 2024-02-11 NOTE — Group Note (Signed)
 Group Topic: Balance in Life  Group Date: 02/11/2024 Start Time: 1230 End Time: 1300 Facilitators: Concha Norway, NT  Department: [facility]  Number of Participants: 6  Group Focus: acceptance Treatment Modality:  Solution-Focused Therapy Interventions utilized were story telling Purpose: enhance coping skills  Name: Rodney Thomas Date of Birth: [birth year]  MR: [id number]    Level of Participation: minimal Quality of Participation: attentive Interactions with others: gave feedback Mood/Affect: appropriate Triggers (if applicable):   Cognition: confused Progress: Moderate Response:   Plan: follow-up needed  Patients Problems:  Patient Active Problem List   Diagnosis Date Noted   Suicidal ideations 02/10/2024   Acute paranoia (HCC) 02/09/2024   Vitamin D deficiency 01/17/2024   Schizoaffective disorder, bipolar type (HCC) 01/16/2024   Chronic insomnia 01/16/2024   Obsessive-compulsive behavior 01/16/2024   Paranoid disorder (HCC) 01/15/2024

## 2024-02-11 NOTE — ED Notes (Signed)
 [facility] called for patient transfer to [facility]

## 2024-02-11 NOTE — Discharge Instructions (Signed)
 Patient is transferring to [facility] for higher level of care

## 2024-02-12 ENCOUNTER — Encounter (HOSPITAL_COMMUNITY): Payer: Self-pay

## 2024-02-12 DIAGNOSIS — F250 Schizoaffective disorder, bipolar type: Principal | ICD-10-CM

## 2024-02-12 NOTE — Progress Notes (Signed)
   02/12/24 1000  Psych Admission Type (Psych Patients Only)  Admission Status Voluntary  Psychosocial Assessment  Patient Complaints Suspiciousness  Eye Contact Brief  Facial Expression Animated  Affect Appropriate to circumstance  Speech Rapid  Interaction Assertive  Motor Activity Fidgety  Appearance/Hygiene Unremarkable  Behavior Characteristics Cooperative  Mood Suspicious  Thought Process  Coherency Circumstantial  Content Paranoia  Delusions None reported or observed  Perception WDL  Hallucination None reported or observed  Judgment Impaired  Confusion None  Danger to Self  Current suicidal ideation? Denies  Danger to Others  Danger to Others None reported or observed   Dar Note: Patient presents anxious with paranoid behavior.  Medications given as prescribed.  Denies suicidal thoughts, auditory and visual hallucinations. Patient visible in milieu with minimal interaction.  Attended group and participated.  Routine safety checks maintained.  Patient is safe on and off the unit.
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
## 2024-02-12 NOTE — Plan of Care (Signed)
  Problem: Safety: Goal: Periods of time without injury will increase Outcome: Progressing   Problem: Coping: Goal: Ability to verbalize frustrations and anger appropriately will improve Outcome: Progressing   Problem: Physical Regulation: Goal: Ability to maintain clinical measurements within normal limits will improve Outcome: Progressing

## 2024-02-12 NOTE — Group Note (Unsigned)
 Date:  02/12/2024 Time:  8:29 PM  Group Topic/Focus:  Wrap-Up Group:   The focus of this group is to help patients review their daily goal of treatment and discuss progress on daily workbooks.     Participation Level:  {BHH PARTICIPATION ZOXWR:60454}  Participation Quality:  {BHH PARTICIPATION QUALITY:22265}  Affect:  {BHH AFFECT:22266}  Cognitive:  {BHH COGNITIVE:22267}  Insight: {BHH Insight2:20797}  Engagement in Group:  {BHH ENGAGEMENT IN UJWJX:91478}  Modes of Intervention:  {BHH MODES OF INTERVENTION:22269}  Additional Comments:  ***  Scot Dock 02/12/2024, 8:29 PM
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
## 2024-02-12 NOTE — BHH Suicide Risk Assessment (Signed)
 [facility] Admission Suicide Risk Assessment  Nursing information obtained from:  Patient Demographic factors:  Male Current Mental Status:  NA Loss Factors:  NA Historical Factors:  NA Risk Reduction Factors:  NA  Total Time spent with patient: 45 minutes Principal Problem: Schizoaffective disorder, bipolar type (HCC) Diagnosis:  Principal Problem:   Schizoaffective disorder, bipolar type (HCC)  Subjective Data:   Rodney Thomas is a 33 y.o., male with past psychiatric history of schizoaffective, bipolar type, OCD who presents to the [facility]untary from [facility] Emergency Department for evaluation and management of worsening paranoia. .    On intake assessment, the patient complains of worsening paranoid thoughts.  He states he feels like someone is always watching him and he's constantly being investigated. Patient reports symptoms are extremely distressing and he also fears that someone is trying to poison him. Patient reports that administered medicines here even concern him. He states he understands that these things probably are not true however his mind will not allow him to think otherwise.   Patient denies any other psychotic symptoms including auditory or visual hallucinations, and patient does not appear to be thought blocking during our interview.     From a symptomology standpoint patient reports insomnia (2-3 hours) of sleep, some increase irritability due to the paranoia and pressured speech.  He denies suicidal ideations or homicidal ideations.    Patient reports good control of hallucinations and continued to take Risperdal after recent [facility] hospitalization. Patient states that he was also compliant with Depakote although obtained Depakote level is subtherapeutic on March 18.  Patient reports that he would like to remain on the medications that he was recently alert prior to [facility].    Chart review: On chart review,  prior to this evaluation, patient was was evaluated in the [facility] emergency department for worsening paranoia and suicidal ideations with a plan to drown himself.  Labs were unremarkable except for a valproic acid level of less than 10.    Patient reported concerning paranoid thoughts that would make him want to kill himself. He reported protective factors include wanting to live for his daughter.  Psychiatry was consulted and recommended inpatient hospitalization.    Patient was eventually transferred to this facility [facility] for evaluation.  Patient continued to have ongoing paranoia and was recommended for higher acuity care at [facility].  Prior to transfer his regiment was Risperdal 6 mg at bedtime, Cogentin 1 mg twice daily, trazodone 50 mg at bedtime and BuSpar 10 mg 3 times daily.   Subjective Sleep past 24 hours: fair Subjective Appetite past 24 hours: fair   Past Psychiatric History:  Previous psych diagnoses: Schizoaffective, bipolar type, OCD, Chronic Insomnia, Paranoia  Prior inpatient psychiatric treatment:  Patient previously admitted February 21 through March 1 for paranoid thoughts, auditory command hallucinations, worsening depression and suicidal thoughts. Prior outpatient psychiatric treatment: Denies Current psychiatric provider: Denies patient reports that he did not have insurance and was unable to establish with [facility] for medication management   Neuromodulation history: denies   Current therapist: Denies Psychotherapy hx: Denies   History of suicide attempts: Denies History of homicide: Denies   Psychotropic medications: Current Risperdal 6 mg at bedtime, Depakote 2000 mg at bedtime, trazodone 50 mg at bedtime, Zoloft 50 mg daily   Past Seroquel   Substance Use History: Alcohol:  denies Hx withdrawal tremors/shakes: denies Hx alcohol related blackouts: denies Hx alcohol induced hallucinations: denies Hx alcoholic  seizures: denies Hx medical  hospitalization due to severe alcohol withdrawal symptoms: denies DUI: denies   --------   Tobacco: denies Cannabis (marijuana): denies Cocaine: denies Methamphetamines: denies Psilocybin (mushrooms): denies Ecstasy (MDMA / molly): denies LSD (acid): denies Opiates (fentanyl / heroin): denies Benzos (Xanax, Klonopin): denies IV drug use: denies Prescribed meds abuse: denies   History of detox: denies History of rehab: denies   Is the patient at risk to self? Yes Has the patient been a risk to self in the past 6 months? Yes Has the patient been a risk to self within the distant past? Yes Is the patient a risk to others? No Has the patient been a risk to others in the past 6 months? No Has the patient been a risk to others within the distant past? No   Continued Clinical Symptoms:  Alcohol Use Disorder Identification Test Final Score (AUDIT): 0 The "Alcohol Use Disorders Identification Test", Guidelines for Use in Primary Care, Second Edition.  World Science writer [facility]). Score between 0-7:  no or low risk or alcohol related problems. Score between 8-15:  moderate risk of alcohol related problems. Score between 16-19:  high risk of alcohol related problems. Score 20 or above:  warrants further diagnostic evaluation for alcohol dependence and treatment.  CLINICAL FACTORS:   Severe Anxiety and/or Agitation Schizophrenia:   Paranoid or undifferentiated type  Musculoskeletal: Strength & Muscle Tone: within normal limits Gait & Station: normal Patient leans: N/A  Psychiatric Specialty Exam  Presentation  General Appearance:  Appropriate for Environment; Bizarre  Eye Contact: Fleeting  Speech: Clear and Coherent; Pressured  Speech Volume: Normal  Handedness: -- (not assessed)   Mood and Affect  Mood: -- ("tired of being paranoid")  Duration of Depression Symptoms: No data recorded Affect: Congruent (anxious)   Thought Process  Thought  Processes: Linear  Descriptions of Associations: Intact  Orientation: None  Thought Content: Paranoid Ideation; Delusions  History of Schizophrenia/Schizoaffective disorder: No  Duration of Psychotic Symptoms: Greater than six months  Hallucinations:Hallucinations: None  Ideas of Reference: Delusions; Paranoia  Suicidal Thoughts:Suicidal Thoughts: No  Homicidal Thoughts:Homicidal Thoughts: No   Sensorium  Memory: Immediate Fair; Remote Fair  Judgment: Intact  Insight: Present   Executive Functions  Concentration: Fair  Attention Span: Fair  Recall: Fair  Fund of Knowledge: Fair  Language: Fair   Psychomotor Activity  Psychomotor Activity:No data recorded  Assets  Assets: Desire for Improvement; Communication Skills   Sleep  Sleep:Sleep: Fair   Physical Exam: Review of Systems Review of Systems  Constitutional:  Negative for chills and fever.  Respiratory:  Negative for cough.   Cardiovascular:  Negative for chest pain.  Gastrointestinal:  Negative for nausea and vomiting.  Neurological:  Negative for headaches.  Psychiatric/Behavioral:  Negative for depression, hallucinations, substance abuse and suicidal ideas. The patient is nervous/anxious.       Vital signs: Blood pressure 113/70, pulse 68, temperature 98 F (36.7 C), temperature source Oral, resp. rate 18, height 5\' 10"  (1.778 m), weight 77.6 kg, SpO2 100%. Body mass index is 24.54 kg/m. Physical Exam Constitutional:      Appearance: Normal appearance.  Eyes:     Conjunctiva/sclera: Conjunctivae normal.  Pulmonary:     Effort: Pulmonary effort is normal.  Musculoskeletal:        General: Normal range of motion.  Neurological:     Mental Status: He is alert and oriented to person, place, and time.  Psychiatric:        Attention  and Perception: He does not perceive auditory or visual hallucinations.        Mood and Affect: Mood is anxious. Mood is not depressed. Affect  is not labile or angry.        Speech: Speech is rapid and pressured.        Behavior: Behavior is not aggressive or combative. Behavior is cooperative.        Thought Content: Thought content is paranoid and delusional. Thought content does not include homicidal or suicidal ideation. Thought content does not include homicidal or suicidal plan.     COGNITIVE FEATURES THAT CONTRIBUTE TO RISK:  None    SUICIDE RISK:   Mild:  Suicidal ideation of limited frequency, intensity, duration, and specificity.  There are no identifiable plans, no associated intent, mild dysphoria and related symptoms, good self-control (both objective and subjective assessment), few other risk factors, and identifiable protective factors, including available and accessible social support.  PLAN OF CARE: see H&P for full plan of care  I certify that inpatient services furnished can reasonably be expected to improve the patient's condition.   Signed: Peterson Ao, MD 02/12/2024, 7:57 PM

## 2024-02-12 NOTE — BHH Group Notes (Signed)
 Spirituality Group Focus of discussion: Forgiveness and Self-Acceptance  Process: Following theoretical framework of group therapy of Chyrl Civatte and further informed by Rogerian and Relational Cultural Theory approaches, participants invited to name:  >Ways of forgiving self and others >Naming and cultivating self-acceptance and self-compassion >Ways our faith/belief/values support this process >Locate points of resonance among group members/engage the "here and now" >Conclude with grounding/breathwork (group today asked for prayer and this was provided only with consent of all participants)  Observations: Devan attended about 0.75 of group. He was engaged but mostly in a passive way.  Jamyiah Labella L. Sophronia Simas, M.Div [phone]

## 2024-02-12 NOTE — BHH Group Notes (Signed)
 Adult Psychoeducational Group Note  Date:  02/12/2024 Time:  9:39 AM  Group Topic/Focus:  Goals Group:   The focus of this group is to help patients establish daily goals to achieve during treatment and discuss how the patient can incorporate goal setting into their daily lives to aide in recovery. Orientation:   The focus of this group is to educate the patient on the purpose and policies of crisis stabilization and provide a format to answer questions about their admission.  The group details unit policies and expectations of patients while admitted.  Participation Level:  Did Not Attend  Additional Comments:  Did not attend  Lucilla Edin 02/12/2024, 9:39 AM

## 2024-02-12 NOTE — BH IP Treatment Plan (Signed)
 Interdisciplinary Treatment and Diagnostic Plan Update  02/12/2024 Time of Session: 10:50 AM Jachin Coury MRN: [id number]  Principal Diagnosis: Schizoaffective disorder, bipolar type (HCC)  Secondary Diagnoses: Principal Problem:   Schizoaffective disorder, bipolar type (HCC)   Current Medications:  Current Facility-Administered Medications  Medication Dose Route Frequency Provider Last Rate Last Admin   acetaminophen (TYLENOL) tablet 650 mg  650 mg Oral Q6H PRN Carrion-Carrero, Margely, MD       alum & mag hydroxide-simeth (MAALOX/MYLANTA) 200-200-20 MG/5ML suspension 30 mL  30 mL Oral Q4H PRN Carrion-Carrero, Margely, MD       benztropine (COGENTIN) tablet 1 mg  1 mg Oral QHS Carrion-Carrero, Margely, MD   1 mg at 02/11/24 2041   busPIRone (BUSPAR) tablet 10 mg  10 mg Oral TID Lorri Frederick, MD   10 mg at 02/12/24 1204   haloperidol (HALDOL) tablet 5 mg  5 mg Oral TID PRN Carrion-Carrero, Karle Starch, MD       And   diphenhydrAMINE (BENADRYL) capsule 50 mg  50 mg Oral TID PRN Carrion-Carrero, Margely, MD       haloperidol lactate (HALDOL) injection 5 mg  5 mg Intramuscular TID PRN Carrion-Carrero, Margely, MD       And   diphenhydrAMINE (BENADRYL) injection 50 mg  50 mg Intramuscular TID PRN Carrion-Carrero, Margely, MD       And   LORazepam (ATIVAN) injection 2 mg  2 mg Intramuscular TID PRN Carrion-Carrero, Margely, MD       haloperidol lactate (HALDOL) injection 10 mg  10 mg Intramuscular TID PRN Carrion-Carrero, Margely, MD       And   diphenhydrAMINE (BENADRYL) injection 50 mg  50 mg Intramuscular TID PRN Carrion-Carrero, Margely, MD       And   LORazepam (ATIVAN) injection 2 mg  2 mg Intramuscular TID PRN Carrion-Carrero, Karle Starch, MD       divalproex (DEPAKOTE ER) 24 hr tablet 500 mg  500 mg Oral QHS Carrion-Carrero, Margely, MD   500 mg at 02/11/24 2040   hydrOXYzine (ATARAX) tablet 25 mg  25 mg Oral TID PRN Carrion-Carrero, Karle Starch, MD       magnesium hydroxide  (MILK OF MAGNESIA) suspension 30 mL  30 mL Oral Daily PRN Carrion-Carrero, Margely, MD       risperiDONE (RISPERDAL) tablet 6 mg  6 mg Oral QHS Carrion-Carrero, Margely, MD   6 mg at 02/11/24 2040   sertraline (ZOLOFT) tablet 50 mg  50 mg Oral Daily Carrion-Carrero, Margely, MD   50 mg at 02/12/24 0836   traZODone (DESYREL) tablet 50 mg  50 mg Oral QHS PRN Carrion-Carrero, Karle Starch, MD       PTA Medications: Medications Prior to Admission  Medication Sig Dispense Refill Last Dose/Taking   benztropine (COGENTIN) 1 MG tablet Take 1 tablet (1 mg total) by mouth at bedtime.      busPIRone (BUSPAR) 10 MG tablet Take 1 tablet (10 mg total) by mouth 3 (three) times daily.      divalproex (DEPAKOTE ER) 500 MG 24 hr tablet Take 1 tablet (500 mg total) by mouth at bedtime.      risperiDONE (RISPERDAL) 3 MG tablet Take 2 tablets (6 mg total) by mouth at bedtime.      sertraline (ZOLOFT) 50 MG tablet Take 1 tablet (50 mg total) by mouth daily.      traZODone (DESYREL) 50 MG tablet Take 1 tablet (50 mg total) by mouth at bedtime as needed for sleep.  Patient Stressors: Health problems   Other: Paranoid    Patient Strengths: Ability for insight  Average or above average intelligence  Communication skills  Motivation for treatment/growth  Supportive family/friends   Treatment Modalities: Medication Management, Group therapy, Case management,  1 to 1 session with clinician, Psychoeducation, Recreational therapy.   Physician Treatment Plan for Primary Diagnosis: Schizoaffective disorder, bipolar type (HCC) Long Term Goal(s):     Short Term Goals:    Medication Management: Evaluate patient's response, side effects, and tolerance of medication regimen.  Therapeutic Interventions: 1 to 1 sessions, Unit Group sessions and Medication administration.  Evaluation of Outcomes: Not Progressing  Physician Treatment Plan for Secondary Diagnosis: Principal Problem:   Schizoaffective disorder, bipolar  type (HCC)  Long Term Goal(s):     Short Term Goals:       Medication Management: Evaluate patient's response, side effects, and tolerance of medication regimen.  Therapeutic Interventions: 1 to 1 sessions, Unit Group sessions and Medication administration.  Evaluation of Outcomes: Not Progressing   RN Treatment Plan for Primary Diagnosis: Schizoaffective disorder, bipolar type (HCC) Long Term Goal(s): Knowledge of disease and therapeutic regimen to maintain health will improve  Short Term Goals: Ability to remain free from injury will improve, Ability to verbalize frustration and anger appropriately will improve, Ability to demonstrate self-control, Ability to participate in decision making will improve, Ability to verbalize feelings will improve, Ability to disclose and discuss suicidal ideas, Ability to identify and develop effective coping behaviors will improve, and Compliance with prescribed medications will improve  Medication Management: RN will administer medications as ordered by provider, will assess and evaluate patient's response and provide education to patient for prescribed medication. RN will report any adverse and/or side effects to prescribing provider.  Therapeutic Interventions: 1 on 1 counseling sessions, Psychoeducation, Medication administration, Evaluate responses to treatment, Monitor vital signs and CBGs as ordered, Perform/monitor CIWA, COWS, AIMS and Fall Risk screenings as ordered, Perform wound care treatments as ordered.  Evaluation of Outcomes: Not Progressing   LCSW Treatment Plan for Primary Diagnosis: Schizoaffective disorder, bipolar type (HCC) Long Term Goal(s): Safe transition to appropriate next level of care at discharge, Engage patient in therapeutic group addressing interpersonal concerns.  Short Term Goals: Engage patient in aftercare planning with referrals and resources, Increase social support, Increase ability to appropriately verbalize  feelings, Increase emotional regulation, Facilitate acceptance of mental health diagnosis and concerns, Facilitate patient progression through stages of change regarding substance use diagnoses and concerns, Identify triggers associated with mental health/substance abuse issues, and Increase skills for wellness and recovery  Therapeutic Interventions: Assess for all discharge needs, 1 to 1 time with Social worker, Explore available resources and support systems, Assess for adequacy in community support network, Educate family and significant other(s) on suicide prevention, Complete Psychosocial Assessment, Interpersonal group therapy.  Evaluation of Outcomes: Not Progressing   Progress in Treatment: Attending groups: Yes. Participating in groups: Yes. Taking medication as prescribed: Yes. Toleration medication: Yes. Family/Significant other contact made:  No, consents are pending Patient understands diagnosis: No. Discussing patient identified problems/goals with staff: No. Medical problems stabilized or resolved: Yes. Denies suicidal/homicidal ideation: Yes. Issues/concerns per patient self-inventory: No.  New problem(s) identified: No  New Short Term/Long Term Goal(s):    medication stabilization, elimination of SI thoughts, development of comprehensive mental wellness plan.   Patient Goals:  "I want to fix my fear of being watched."  Discharge Plan or Barriers:  Patient recently admitted. CSW will continue to follow and assess for  appropriate referrals and possible discharge planning.   Reason for Continuation of Hospitalization: Medication stabilization Suicidal ideation Paranoia  Estimated Length of Stay:  5 - 7 days  Last 3 [place] Suicide Severity Risk Score: Flowsheet Row [facility] (Current[facility]09[facility]2024 in [facility] ED from 02/08/2024 in [facility] Emergency Department at  [facility]  C-SSRS RISK CATEGORY No Risk High Risk High Risk       Last [facility] 2/9 Scores:    02/11/2024   11:55 AM  Depression screen PHQ 2/9  Decreased Interest 1  Down, Depressed, Hopeless 2  PHQ - 2 Score 3  Altered sleeping 1  Tired, decreased energy 1  Change in appetite 1  Feeling bad or failure about yourself  0  Trouble concentrating 1  Moving slowly or fidgety/restless 1  Suicidal thoughts 0  PHQ-9 Score 8  Difficult doing work/chores Somewhat difficult    Scribe for Treatment Team: Nyra Jabs 02/12/2024 12:27 PM

## 2024-02-12 NOTE — Progress Notes (Signed)
   02/12/24 2000  Psych Admission Type (Psych Patients Only)  Admission Status Voluntary  Psychosocial Assessment  Patient Complaints Suspiciousness  Eye Contact Brief  Facial Expression Animated  Affect Appropriate to circumstance  Speech Rapid  Interaction Assertive  Motor Activity Fidgety  Appearance/Hygiene Unremarkable  Behavior Characteristics Cooperative  Mood Suspicious  Thought Process  Coherency Circumstantial  Content Paranoia  Delusions None reported or observed  Perception WDL  Hallucination None reported or observed  Judgment Impaired  Confusion None  Danger to Self  Current suicidal ideation? Denies  Agreement Not to Harm Self Yes  Description of Agreement Verbal Contract  Danger to Others  Danger to Others None reported or observed

## 2024-02-12 NOTE — Progress Notes (Signed)
 Recreation Therapy Notes  Patient admitted to unit 3.20.25. Due to admission within last year, no new recreation therapy assessment conducted at this time. Last assessment conducted on 2.24.25.    Reason for current admission per patient, "schizophrenia".  Patient reports not seeing his daughter as current stressor.  Patient added video games as a coping skill and removed self-injury.  Patient identified a new leisure interest as going to the zoo and removed going to the dog park.  Patient added "getting good responses" as a strength.  Patient added "meeting new people" as an area of improvement.  Patient reports goal of "get help with feeling like I'm being watched and learn to talk to people".  Patient denies SI, HI, AVH at this time.    Information found below from assessment conducted 2.24.25.  Coping Skills: Isolation, Self-Injury, Music, Talk, Avoidance, Hot Bath/Shower  Leisure Interests: Eating, Going to dog park to watch the animals  Patient Strengths: Intelligent, Fast learner, Good memory  Areas of Improvement: "talking to people"   Rodney Thomas, LRT,CTRS [name] 02/12/2024 1:12 PM

## 2024-02-12 NOTE — Group Note (Signed)
 Recreation Therapy Group Note   Group Topic:Problem Solving  Group Date: 02/12/2024 Start Time: 1023 End Time: 1045 Facilitators: Hollyn Stucky-McCall, LRT,CTRS Location: 500 Hall Dayroom   Group Topic: Communication, Team Building, Problem Solving  Goal Area(s) Addresses:  Patient will effectively work with peer towards shared goal.  Patient will identify skills used to make activity successful.  Patient will share challenges and verbalize solution-driven approaches used. Patient will identify how skills used during activity can be used to reach post d/c goals.   Intervention: STEM Activity   Activity: [employer]. Patients were provided the following materials: 5 drinking straws, 5 rubber bands, 5 paper clips, 2 index cards and 2 drinking cups. Using the provided materials patients were asked to build a launching mechanism to launch a ping pong ball across the room, approximately 10 feet. Patients were divided into teams of 3-5. Instructions required all materials be incorporated into the device, functionality of items left to the peer group's discretion.  Education: Pharmacist, community, Scientist, physiological, Air cabin crew, Building control surveyor.   Education Outcome: Acknowledges education/In group clarification offered/Needs additional education.    Affect/Mood: N/A   Participation Level: Did not attend    Clinical Observations/Individualized Feedback:      Plan: Continue to engage patient in RT group sessions 2-3x/week.   Elzabeth Mcquerry-McCall, LRT,CTRS 02/12/2024 1:08 PM
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
## 2024-02-13 NOTE — Progress Notes (Signed)
   02/13/24 1000  Psych Admission Type (Psych Patients Only)  Admission Status Voluntary  Psychosocial Assessment  Patient Complaints Anxiety  Eye Contact Brief  Facial Expression Animated  Affect Appropriate to circumstance  Speech Rapid  Interaction Assertive  Motor Activity Pacing  Appearance/Hygiene Unremarkable  Behavior Characteristics Cooperative  Mood Suspicious  Thought Process  Coherency Circumstantial  Content Paranoia  Delusions None reported or observed  Perception WDL  Hallucination None reported or observed  Judgment Impaired  Confusion None  Danger to Self  Current suicidal ideation? Denies  Agreement Not to Harm Self Yes  Description of Agreement verbal  Danger to Others  Danger to Others None reported or observed

## 2024-02-13 NOTE — Plan of Care (Signed)
   Problem: Education: Goal: Emotional status will improve Outcome: Progressing   Problem: Activity: Goal: Sleeping patterns will improve Outcome: Progressing

## 2024-02-13 NOTE — Group Note (Signed)
 Date:  02/13/2024 Time:  9:22 PM  Group Topic/Focus:  Wrap-Up Group:   The focus of this group is to help patients review their daily goal of treatment and discuss progress on daily workbooks.    Participation Level:  Active  Participation Quality:  Appropriate  Affect:  Appropriate  Cognitive:  Appropriate  Insight: Appropriate  Engagement in Group:  Engaged  Modes of Intervention:  Education and Exploration  Additional Comments:  Patient attended and participated in group tonight.  He report that he would like to go to [place] to see [employer], and learn the language. The thing he likes about himself is that he is egotistical  Scot Dock 02/13/2024, 9:22 PM

## 2024-02-13 NOTE — Progress Notes (Signed)
 Pt denies SI/HI/AV. Pt has been pleasant and cooperative. Pt was compliant with scheduled medications. Pt was encouraged to attend groups. Pt was offered support and encouragement. Encouraged to reach out to staff for any concerns or questions. Q 15 minute checks were done for safety. Safety maintained.

## 2024-02-14 NOTE — Plan of Care (Signed)
   Problem: Education: Goal: Knowledge of Veteran General Education information/materials will improve Outcome: Progressing Goal: Emotional status will improve Outcome: Progressing Goal: Verbalization of understanding the information provided will improve Outcome: Progressing

## 2024-02-14 NOTE — Progress Notes (Signed)
   02/14/24 0800  Psych Admission Type (Psych Patients Only)  Admission Status Voluntary  Psychosocial Assessment  Patient Complaints Anxiety  Eye Contact Brief;Darting  Facial Expression Animated  Affect Appropriate to circumstance  Speech Logical/coherent  Interaction Assertive;Cautious  Motor Activity Fidgety  Appearance/Hygiene In scrubs  Behavior Characteristics Cooperative;Appropriate to situation  Mood Anxious  Thought Process  Coherency WDL  Content WDL  Delusions WDL  Perception WDL  Hallucination None reported or observed  Judgment Poor  Confusion None  Danger to Self  Current suicidal ideation? Denies  Agreement Not to Harm Self Yes  Description of Agreement verbal  Danger to Others  Danger to Others None reported or observed

## 2024-02-14 NOTE — Plan of Care (Signed)
   Problem: Activity: Goal: Interest or engagement in activities will improve Outcome: Progressing   Problem: Coping: Goal: Ability to verbalize frustrations and anger appropriately will improve Outcome: Progressing   Problem: Safety: Goal: Periods of time without injury will increase Outcome: Progressing
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
## 2024-02-14 NOTE — Progress Notes (Signed)
   02/13/24 2130  Psych Admission Type (Psych Patients Only)  Admission Status Voluntary  Psychosocial Assessment  Patient Complaints Anxiety (Pt appears paranoid and suspicious with darting glances looking at others.  Pt reported that he wants to go to the other unit when he can. Pt claimed that his meds are helping and denied adverse reactions. He offers minimal, superficial insight.)  Eye Contact Brief;Darting  Facial Expression Animated  Affect Appropriate to circumstance  Speech Rapid  Interaction Assertive;Cautious;Superficial;Forwards little;Minimal  Motor Activity Fidgety;Pacing  Appearance/Hygiene Unremarkable  Behavior Characteristics Cooperative  Mood Anxious;Other (Comment) (Cordial; polite yet brief interaction.  Pt denied SI/HI/AVH)  Thought Process  Coherency Loose associations  Content Other (Comment)  Delusions None reported or observed  Perception WDL  Hallucination None reported or observed  Judgment Impaired  Confusion None  Danger to Self  Current suicidal ideation? Denies  Agreement Not to Harm Self Yes  Description of Agreement Verbal  Danger to Others  Danger to Others None reported or observed

## 2024-02-14 NOTE — Group Note (Signed)
 Date:  02/14/2024 Time:  9:16 PM  Group Topic/Focus:  Wrap-Up Group:   The focus of this group is to help patients review their daily goal of treatment and discuss progress on daily workbooks.    Participation Level:  Active  Participation Quality:  Appropriate  Affect:  Appropriate  Cognitive:  Appropriate  Insight: Appropriate  Engagement in Group:  Engaged  Modes of Intervention:  Education and Exploration  Additional Comments:  Patient attended and participated in group tonight. He reports that today he went outside. He had an amazing day. He talked to peers. He believe this was one of his best day here.  Lita Mains [facility] 02/14/2024, 9:16 PM

## 2024-02-15 LAB — VALPROIC ACID LEVEL: Valproic Acid Lvl: 40 ug/mL — ABNORMAL LOW (ref 50.0–100.0)

## 2024-02-15 MED ORDER — DIVALPROEX SODIUM ER 500 MG PO TB24
1000.0000 mg | ORAL_TABLET | Freq: Every day | ORAL | Status: DC
  Administered 2024-02-15 – 2024-02-17 (×3): 1000 mg via ORAL
  Filled 2024-02-15 (×4): qty 2
  Filled 2024-02-15: qty 14

## 2024-02-15 MED ORDER — POLYETHYLENE GLYCOL 3350 17 G PO PACK
17.0000 g | PACK | Freq: Every day | ORAL | Status: DC
  Administered 2024-02-15 – 2024-02-17 (×3): 17 g via ORAL
  Filled 2024-02-15 (×7): qty 1

## 2024-02-15 NOTE — Plan of Care (Signed)
 Pt presents with animated expression and anxious affect. Denies SI, HI, and AVH. Pt complains of anxiety and suspiciousness. Cooperative and assertive in interactions with staff. Pt attended and participated in group and was observed in the dayroom socializing with peers. Medication compliant with no adverse reactions. Safety checks maintained at q 15 minutes. Support, encouragement, and reassurance offered to the pt.   Problem: Education: Goal: Emotional status will improve Outcome: Progressing Goal: Mental status will improve Outcome: Progressing Goal: Verbalization of understanding the information provided will improve Outcome: Progressing   Problem: Safety: Goal: Periods of time without injury will increase Outcome: Progressing

## 2024-02-15 NOTE — Group Note (Signed)
 Recreation Therapy Group Note   Group Topic:Coping Skills  Group Date: 02/15/2024 Start Time: 1015 End Time: 1045 Facilitators: Coulson Wehner-McCall, LRT,CTRS Location: [address] Dayroom   Group Topic: Coping Skills   Goal Area(s) Addresses: Patient will define what a coping skill is. Patient will create a list of healthy coping skills beginning with each letter of the alphabet. Patient will successfully identify positive coping skills they can use post d/c.  Patient will acknowledge benefit(s) of using learned coping skills post d/c.  Intervention: Group work   Activity: Coping A to Z. Patient asked to identify what a coping skill is and when they use them. Patients with Clinical research associate discussed healthy versus unhealthy coping skills. Next patients were given a blank worksheet titled "Coping Skills A-Z". Patients were given 15 minutes to brainstorm with before ideas were presented to the large group. Patients and LRT debriefed on the importance of coping skill selection based on situation and back-up plans when a skill tried is not effective. At the end of group, patients were given an handout of alphabetized strategies to keep for future reference.   Education: Pharmacologist, Scientist, physiological, Discharge Planning.    Education Outcome: Acknowledges education/Verbalizes understanding/In group clarification offered/Additional education needed   Affect/Mood: Appropriate   Participation Level: Engaged   Participation Quality: Independent   Behavior: Appropriate   Speech/Thought Process: Focused   Insight: Good   Judgement: Good   Modes of Intervention: Worksheet   Patient Response to Interventions:  Engaged   Education Outcome:  In group clarification offered    Clinical Observations/Individualized Feedback: Pt expressed being cautious around new people. Pt explained watching them, how they react to things or the vibe he gets from them before deciding to engage or not with that  person. Pt stated throughout group he was unable to identify positive coping skills because all of his coping skills were negative. Pt was able to identify some positive coping skills like cry, eat, determination, integrity, behavior, eat and forget. LRT and peers helped patient come up with coping skills for the letters he left blank. Pt was helped with identifying coping skills like meditation, questioning, patience, yoga, talk it out, relax and understanding.       Plan: Continue to engage patient in RT group sessions 2-3x/week.   Angie Hogg-McCall, LRT,CTRS 02/15/2024 1:32 PM

## 2024-02-15 NOTE — Progress Notes (Signed)
   02/15/24 0810  Psych Admission Type (Psych Patients Only)  Admission Status Voluntary  Psychosocial Assessment  Patient Complaints Anxiety;Suspiciousness  Eye Contact Brief  Facial Expression Flat  Affect Anxious;Preoccupied  Speech Logical/coherent  Interaction Assertive  Motor Activity Other (Comment) (WDL)  Appearance/Hygiene Unremarkable  Behavior Characteristics Cooperative;Anxious  Mood Anxious;Preoccupied;Suspicious  Thought Process  Coherency WDL  Content Paranoia  Delusions None reported or observed  Perception WDL  Hallucination None reported or observed  Judgment Poor  Confusion None  Danger to Self  Current suicidal ideation? Denies  Agreement Not to Harm Self Yes  Description of Agreement Verbal  Danger to Others  Danger to Others None reported or observed
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
## 2024-02-15 NOTE — Group Note (Signed)
 Date:  02/15/2024 Time:  9:10 PM  Group Topic/Focus:  Wrap-Up Group:   The focus of this group is to help patients review their daily goal of treatment and discuss progress on daily workbooks.    Participation Level:  Active  Participation Quality:  Appropriate  Affect:  Appropriate  Cognitive:  Appropriate  Insight: Appropriate  Engagement in Group:  Engaged  Modes of Intervention:  Education and Exploration  Additional Comments:  Patient attended and participated in group tonight. He reports that his goal today was to listen. He did meet his goal.and meet some new friends also  Scot Dock 02/15/2024, 9:10 PM

## 2024-02-15 NOTE — Group Note (Signed)
 LCSW Group Therapy Note   Group Date: 02/15/2024 Start Time: 1300 End Time: 1400  Participation:  patient was present and actively participated in the conversation.  Type of Therapy:  Group Therapy  Title:  Speaking from the Heart: Communicating with Understanding and Empathy  Objective:  To help participants develop effective communication skills to express themselves clearly, listen actively, and navigate conflicts in a healthy way.  Goals: Increase awareness of verbal and non-verbal communication skills. Practice using "I" statements and active listening techniques. Learn coping strategies for managing communication stress.  Summary: Participants explored the importance of communication, discussed challenges, and practiced skills such as active listening and assertive expression. They reflected on past experiences and identified ways to improve communication in their daily lives.  Therapeutic Modalities: Cognitive-Behavioral Therapy (CBT): Restructuring negative thought patterns in communication. Mindfulness: Staying present and calm during conversations.   Alla Feeling, LCSWA 02/15/2024  6:42 PM

## 2024-02-15 NOTE — Group Note (Signed)
 Date:  02/15/2024 Time:  5:09 PM  Group Topic/Focus:  Emotional Education:   The focus of this group is to discuss what feelings/emotions are, and how they are experienced. Managing Feelings:   The focus of this group is to identify what feelings patients have difficulty handling and develop a plan to handle them in a healthier way upon discharge. Rediscovering Joy:   The focus of this group is to explore various ways to relieve stress in a positive manner.    Participation Level:  Active  Participation Quality:  Intrusive  Affect:  Irritable  Cognitive:  Alert  Insight: Appropriate  Engagement in Group:  Off Topic  Modes of Intervention:  Activity and Socialization  Additional Comments:    Gardiner Barefoot 02/15/2024, 5:09 PM

## 2024-02-15 NOTE — [facility] Suicide Risk Assessment (Signed)
 [facility] INPATIENT:  Family/Significant Other Suicide Prevention Education  Suicide Prevention Education:  Education Completed; Javante Nilsson (mom) [phone],  (name of family member/significant other) has been identified by the patient as the family member/significant other with whom the patient will be residing, and identified as the person(s) who will aid the patient in the event of a mental health crisis (suicidal ideations/suicide attempt).  With written consent from the patient, the family member/significant other has been provided the following suicide prevention education, prior to the and/or following the discharge of the patient.  Mom said that patient will return home at discharge.   Mom said that they don't have any guns or weapons.  She said that she will secure medications and sharp objects (such as knives or scissors).  Mom said that she doesn't have any concerns about patient returning home, but she wants him to remain in the hospital until he is more stable.  When asked who lives in the home, she said: herself, her daughter, and patient.  Mom explained that patient came to the hospital because he was experiencing panic attacks, was afraid people were poisoning his food, smelled smoke in the home (even though there was no smoke), and was having "conversations with imaginary people."  Patient has not been adherent to his medications.  He has been focused on numbers, repeatedly opening and closing the car door, and counting to 10 when applying deodorant.  Mom speaks with patient on the phone.  When asked if patient is doing better, mom said that he asks her to repeat the same things 3 to 5 times.  The suicide prevention education provided includes the following: Suicide risk factors Suicide prevention and interventions National Suicide Hotline telephone number [facility] assessment telephone number [facility] Emergency Assistance 911 [facility] and/or  Residential Mobile Crisis Unit telephone number  Request made of family/significant other to: Remove weapons (e.g., guns, rifles, knives), all items previously/currently identified as safety concern.   Remove drugs/medications (over-the-counter, prescriptions, illicit drugs), all items previously/currently identified as a safety concern.  The family member/significant other verbalizes understanding of the suicide prevention education information provided.  The family member/significant other agrees to remove the items of safety concern listed above.  Kenlyn Lose O Rylin Seavey, LCSWA 02/15/2024, 5:22 PM

## 2024-02-15 NOTE — Plan of Care (Signed)
   Problem: Education: Goal: Emotional status will improve Outcome: Progressing Goal: Mental status will improve Outcome: Progressing Goal: Verbalization of understanding the information provided will improve Outcome: Progressing

## 2024-02-15 NOTE — Group Note (Unsigned)
 Date:  02/15/2024 Time:  7:44 PM  Group Topic/Focus:  Wrap-Up Group:   The focus of this group is to help patients review their daily goal of treatment and discuss progress on daily workbooks.     Participation Level:  {[facility] PARTICIPATION ZOXWR:60454}  Participation Quality:  {[facility] PARTICIPATION QUALITY:22265}  Affect:  {[facility] AFFECT:22266}  Cognitive:  {[facility] COGNITIVE:22267}  Insight: {[facility] Insight2:20797}  Engagement in Group:  {[facility] ENGAGEMENT IN UJWJX:91478}  Modes of Intervention:  {[facility] MODES OF INTERVENTION:22269}  Additional Comments:  ***  Scot Dock 02/15/2024, 7:44 PM

## 2024-02-16 MED ORDER — BENZTROPINE MESYLATE 0.5 MG PO TABS
0.5000 mg | ORAL_TABLET | Freq: Two times a day (BID) | ORAL | Status: DC
  Administered 2024-02-16 – 2024-02-18 (×4): 0.5 mg via ORAL
  Filled 2024-02-16 (×7): qty 1
  Filled 2024-02-16: qty 14
  Filled 2024-02-16: qty 1
  Filled 2024-02-16: qty 14

## 2024-02-16 MED ORDER — DOCUSATE SODIUM 100 MG PO CAPS
100.0000 mg | ORAL_CAPSULE | Freq: Every day | ORAL | Status: DC
  Administered 2024-02-16 – 2024-02-17 (×2): 100 mg via ORAL
  Filled 2024-02-16: qty 1
  Filled 2024-02-16: qty 7
  Filled 2024-02-16 (×5): qty 1
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
## 2024-02-16 NOTE — Plan of Care (Signed)
 Pt presents with anxious and preoccupied affect. Pt is restless/fidgety and speech is rapid. Denies SI, HI, AVH, and pain. Cooperative and assertive in interactions with staff. Rates anxiety 6/10 and depression 4/10, and complains of paranoia and suspicion. Medication compliant with no adverse reactions. Safety checks maintained at q 15 minutes. Support, encouragement, and reassurance offered to the pt.   Problem: Education: Goal: Emotional status will improve Outcome: Progressing Goal: Mental status will improve Outcome: Progressing Goal: Verbalization of understanding the information provided will improve Outcome: Progressing   Problem: Activity: Goal: Interest or engagement in activities will improve Outcome: Progressing   Problem: Safety: Goal: Periods of time without injury will increase Outcome: Progressing

## 2024-02-16 NOTE — Progress Notes (Signed)
   02/16/24 2000  Psych Admission Type (Psych Patients Only)  Admission Status Voluntary  Psychosocial Assessment  Patient Complaints Anxiety;Depression;Self-harm thoughts;Suspiciousness  Eye Contact Fair  Facial Expression Anxious  Affect Preoccupied;Anxious  Speech Logical/coherent  Interaction Assertive  Motor Activity Other (Comment) (wnl)  Appearance/Hygiene Unremarkable  Behavior Characteristics Cooperative;Appropriate to situation;Anxious  Mood Anxious;Pleasant  Thought Process  Coherency Circumstantial  Content Paranoia (about the police)  Delusions None reported or observed  Perception WDL  Hallucination None reported or observed  Judgment Impaired  Confusion None  Danger to Self  Current suicidal ideation? Passive  Description of Suicide Plan no plan  Self-Injurious Behavior No self-injurious ideation or behavior indicators observed or expressed   Agreement Not to Harm Self Yes  Description of Agreement verbal  Danger to Others  Danger to Others None reported or observed   Progress note   D: Pt seen in dayroom. Pt denies HI, AVH. Pt endorses some passive SI r/t conversation earlier in the day. "I have a fear of the police. They were talking something about the police today. I was just in jail for a misdemeanor and they were talking about killings and stuff. I'm schizophrenic and claustrophobic so you can imagine how it was - I was terrified. I have no plan to hurt myself but I don't want them to start talking about that dumb stuff again." Pt contracts for safety. Pt rates pain  0/10. Pt rates anxiety  8/10 and depression  4/10. Pt states his abdomen is distended and he has some pain r/t hard stools. "I had a bowel movement yesterday but it was really hard and not easy to get out." Encouraged pt to drink more fluids. Will give PRN as appropriate. No other concerns noted at this time.  A: Pt provided support and encouragement. Pt given scheduled medication as prescribed.  PRNs as appropriate. Q15 min checks for safety.   R: Pt safe on the unit. Will continue to monitor.

## 2024-02-16 NOTE — Plan of Care (Signed)
   Problem: Education: Goal: Emotional status will improve Outcome: Not Progressing Goal: Mental status will improve Outcome: Not Progressing

## 2024-02-16 NOTE — Group Note (Signed)
 Date:  02/16/2024 Time:  8:54 PM  Group Topic/Focus:  Wrap-Up Group:   The focus of this group is to help patients review their daily goal of treatment and discuss progress on daily workbooks.    Participation Level:  Active  Participation Quality:  Appropriate  Affect:  Appropriate  Cognitive:  Appropriate  Insight: Appropriate  Engagement in Group:  Engaged  Modes of Intervention:  Discussion  Additional Comments:   Pt states he has been more social than normal today and is hoping he can keep that trait for when he leaves. Pt was abke to attend groups and recreation time. Pt spoke with his doctors and csw and feels they are keeping him informed. Pt states he is supposed to d/c this week. Pt endorsed anxiety at a 9 and depression at a 2. Pt did endorse some SI but states he already spoke with his nurse about said feelings.   Vevelyn Pat 02/16/2024, 8:54 PM

## 2024-02-17 ENCOUNTER — Encounter (HOSPITAL_COMMUNITY): Payer: Self-pay

## 2024-02-17 NOTE — Progress Notes (Signed)
   02/17/24 1000  Psych Admission Type (Psych Patients Only)  Admission Status Voluntary  Psychosocial Assessment  Patient Complaints Suspiciousness  Eye Contact Fair  Facial Expression Anxious  Affect Preoccupied  Speech Logical/coherent  Interaction Assertive  Motor Activity Other (Comment)  Appearance/Hygiene Unremarkable  Behavior Characteristics Appropriate to situation  Mood Anxious  Thought Process  Coherency Circumstantial  Content Paranoia  Delusions None reported or observed  Perception WDL  Hallucination None reported or observed  Judgment Poor  Confusion None  Danger to Self  Current suicidal ideation? Denies  Danger to Others  Danger to Others None reported or observed

## 2024-02-17 NOTE — Progress Notes (Signed)
   02/17/24 8413  15 Minute Checks  Location Bedroom  Visual Appearance Calm  Behavior Composed  Sleep (Behavioral Health Patients Only)  Calculate sleep? (Click Yes once per 24 hr at 0600 safety check) Yes  Documented sleep last 24 hours 7.25
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
## 2024-02-17 NOTE — [facility] Group Notes (Signed)
 Adult Psychoeducational Group Note  Date:  02/17/2024 Time:  5:47 PM  Group Topic/Focus:  Goals Group:   The focus of this group is to help patients establish daily goals to achieve during treatment and discuss how the patient can incorporate goal setting into their daily lives to aide in recovery. Orientation:   The focus of this group is to educate the patient on the purpose and policies of crisis stabilization and provide a format to answer questions about their admission.  The group details unit policies and expectations of patients while admitted.  Participation Level:  Did Not Attend  Participation Quality:    Affect:    Cognitive:    Insight:   Engagement in Group:    Modes of Intervention:    Additional Comments:    Sheran Lawless 02/17/2024, 5:47 PM

## 2024-02-17 NOTE — Progress Notes (Signed)
 Adult Psychoeducational Group Note  Date:  02/17/2024 Time:  9:54 PM  Group Topic/Focus:  Wrap-Up Group:   The focus of this group is to help patients review their daily goal of treatment and discuss progress on daily workbooks.  Participation Level:  Did Not Attend  Participation Quality:   n/a  Affect:  Tearful  Cognitive:   n/a  Insight: None  Engagement in Group:   n/a  Modes of Intervention:   n/a  Additional Comments:  Lenis did not attend wrap up  group  Charna Busman Long 02/17/2024, 9:54 PM

## 2024-02-17 NOTE — Plan of Care (Signed)
  Problem: Education: Goal: Emotional status will improve Outcome: Progressing Goal: Mental status will improve Outcome: Progressing   Problem: Activity: Goal: Interest or engagement in activities will improve Outcome: Progressing Goal: Sleeping patterns will improve Outcome: Progressing   Problem: Coping: Goal: Ability to demonstrate self-control will improve Outcome: Progressing   Problem: Health Behavior/Discharge Planning: Goal: Compliance with treatment plan for underlying cause of condition will improve Outcome: Progressing   Problem: Physical Regulation: Goal: Ability to maintain clinical measurements within normal limits will improve Outcome: Progressing   Problem: Safety: Goal: Periods of time without injury will increase Outcome: Progressing

## 2024-02-17 NOTE — Progress Notes (Signed)
   02/17/24 2300  Psych Admission Type (Psych Patients Only)  Admission Status Voluntary  Psychosocial Assessment  Patient Complaints Suspiciousness;Worrying  Eye Contact Fair  Facial Expression Anxious  Affect Preoccupied  Speech Logical/coherent  Interaction Assertive  Motor Activity Other (Comment) (WNL)  Appearance/Hygiene Unremarkable  Behavior Characteristics Appropriate to situation;Anxious  Mood Anxious  Thought Process  Coherency Circumstantial  Content Paranoia  Delusions None reported or observed  Perception WDL  Hallucination None reported or observed  Judgment Limited  Confusion None  Danger to Self  Current suicidal ideation? Denies  Description of Suicide Plan None  Self-Injurious Behavior No self-injurious ideation or behavior indicators observed or expressed   Agreement Not to Harm Self Yes  Description of Agreement Verbal agreement  Danger to Others  Danger to Others None reported or observed

## 2024-02-17 NOTE — Group Note (Signed)
 Recreation Therapy Group Note   Group Topic:Other  Group Date: 02/17/2024 Start Time: 1025 End Time: 1111 Facilitators: Jumar Greenstreet-McCall, LRT,CTRS Location: [address] Dayroom   Group Topic/Focus: Music Therapy  Goal Area(s) Addresses:  Patient will select songs that have meaning to them.  Patient will identify the benefits of music.  Intervention: Music  Activity: Music Therapy. LRT and patients discussed the benefits of music when dealing with tough situations. Patients were allowed to pick any songs of their choosing as long as they were clean and appropriate during group.    Affect/Mood: Appropriate   Participation Level: Engaged   Participation Quality: Independent   Behavior: Appropriate   Speech/Thought Process: Focused   Insight: Good   Judgement: Good   Modes of Intervention: Music   Patient Response to Interventions:  Engaged   Education Outcome:  In group clarification offered    Clinical Observations/Individualized Feedback: Pt came in late to group. Pt was attentive and focused. Pt requested "Last Night" by Wende Bushy. Pt had no specific reason for wanting to hear the song. Pt was social with peers and appropriate.     Plan: Continue to engage patient in RT group sessions 2-3x/week.   Freddi Schrager-McCall, LRT,CTRS  02/17/2024 1:29 PM

## 2024-02-17 NOTE — Plan of Care (Signed)
  Problem: Education: Goal: Knowledge of [place] [facility] Education information/materials will improve Outcome: Progressing Goal: Emotional status will improve Outcome: Progressing Goal: Mental status will improve Outcome: Progressing Goal: Verbalization of understanding the information provided will improve Outcome: Progressing   Problem: Activity: Goal: Interest or engagement in activities will improve Outcome: Progressing Goal: Sleeping patterns will improve Outcome: Progressing   Problem: Coping: Goal: Ability to verbalize frustrations and anger appropriately will improve Outcome: Progressing Goal: Ability to demonstrate self-control will improve Outcome: Progressing   Problem: Health Behavior/Discharge Planning: Goal: Identification of resources available to assist in meeting health care needs will improve Outcome: Progressing Goal: Compliance with treatment plan for underlying cause of condition will improve Outcome: Progressing   Problem: Physical Regulation: Goal: Ability to maintain clinical measurements within normal limits will improve Outcome: Progressing   Problem: Safety: Goal: Periods of time without injury will increase Outcome: Progressing   Problem: Education: Goal: Knowledge of [facility] Education information/materials will improve Outcome: Progressing Goal: Emotional status will improve Outcome: Progressing Goal: Mental status will improve Outcome: Progressing Goal: Verbalization of understanding the information provided will improve Outcome: Progressing   Problem: Activity: Goal: Interest or engagement in activities will improve Outcome: Progressing Goal: Sleeping patterns will improve Outcome: Progressing   Problem: Coping: Goal: Ability to verbalize frustrations and anger appropriately will improve Outcome: Progressing Goal: Ability to demonstrate self-control will improve Outcome: Progressing   Problem: Health  Behavior/Discharge Planning: Goal: Identification of resources available to assist in meeting health care needs will improve Outcome: Progressing Goal: Compliance with treatment plan for underlying cause of condition will improve Outcome: Progressing   Problem: Physical Regulation: Goal: Ability to maintain clinical measurements within normal limits will improve Outcome: Progressing   Problem: Safety: Goal: Periods of time without injury will increase Outcome: Progressing   Problem: Education: Goal: Will be free of psychotic symptoms Outcome: Progressing Goal: Knowledge of the prescribed therapeutic regimen will improve Outcome: Progressing   Problem: Coping: Goal: Coping ability will improve Outcome: Progressing Goal: Will verbalize feelings Outcome: Progressing   Problem: Health Behavior/Discharge Planning: Goal: Compliance with prescribed medication regimen will improve Outcome: Progressing   Problem: Nutritional: Goal: Ability to achieve adequate nutritional intake will improve Outcome: Progressing   Problem: Coping: Goal: Ability to identify and develop effective coping behavior will improve Outcome: Progressing   Problem: Self-Concept: Goal: Ability to identify factors that promote anxiety will improve Outcome: Progressing Goal: Level of anxiety will decrease Outcome: Progressing Goal: Ability to modify response to factors that promote anxiety will improve Outcome: Progressing   Problem: Health Behavior/Discharge Planning: Goal: Identification of resources available to assist in meeting health care needs will improve Outcome: Progressing   Problem: Self-Concept: Goal: Will verbalize positive feelings about self Outcome: Progressing

## 2024-02-17 NOTE — BH IP Treatment Plan (Signed)
 Interdisciplinary Treatment and Diagnostic Plan Update  02/17/2024 Time of Session: 0944 - UPDATE Rodney Thomas MRN: [id number]  Principal Diagnosis: Schizoaffective disorder, bipolar type (HCC)  Secondary Diagnoses: Principal Problem:   Schizoaffective disorder, bipolar type (HCC) Active Problems:   Paranoid disorder (HCC)   Obsessive-compulsive behavior   Current Medications:  Current Facility-Administered Medications  Medication Dose Route Frequency Provider Last Rate Last Admin   acetaminophen (TYLENOL) tablet 650 mg  650 mg Oral Q6H PRN Carrion-Carrero, Margely, MD       alum & mag hydroxide-simeth (MAALOX/MYLANTA) 200-200-20 MG/5ML suspension 30 mL  30 mL Oral Q4H PRN Carrion-Carrero, Margely, MD       benztropine (COGENTIN) tablet 0.5 mg  0.5 mg Oral Q12H Massengill, Nathan, MD   0.5 mg at 02/17/24 0817   busPIRone (BUSPAR) tablet 10 mg  10 mg Oral TID Lorri Frederick, MD   10 mg at 02/17/24 0817   haloperidol (HALDOL) tablet 5 mg  5 mg Oral TID PRN Carrion-Carrero, Karle Starch, MD       And   diphenhydrAMINE (BENADRYL) capsule 50 mg  50 mg Oral TID PRN Carrion-Carrero, Margely, MD       haloperidol lactate (HALDOL) injection 5 mg  5 mg Intramuscular TID PRN Carrion-Carrero, Margely, MD       And   diphenhydrAMINE (BENADRYL) injection 50 mg  50 mg Intramuscular TID PRN Carrion-Carrero, Margely, MD       And   LORazepam (ATIVAN) injection 2 mg  2 mg Intramuscular TID PRN Carrion-Carrero, Margely, MD       haloperidol lactate (HALDOL) injection 10 mg  10 mg Intramuscular TID PRN Carrion-Carrero, Margely, MD       And   diphenhydrAMINE (BENADRYL) injection 50 mg  50 mg Intramuscular TID PRN Carrion-Carrero, Margely, MD       And   LORazepam (ATIVAN) injection 2 mg  2 mg Intramuscular TID PRN Carrion-Carrero, Karle Starch, MD       divalproex (DEPAKOTE ER) 24 hr tablet 1,000 mg  1,000 mg Oral QHS Peterson Ao, MD   1,000 mg at 02/16/24 2100   docusate sodium (COLACE)  capsule 100 mg  100 mg Oral Daily Peterson Ao, MD   100 mg at 02/17/24 0818   hydrOXYzine (ATARAX) tablet 25 mg  25 mg Oral TID PRN Carrion-Carrero, Karle Starch, MD       magnesium hydroxide (MILK OF MAGNESIA) suspension 30 mL  30 mL Oral Daily PRN Carrion-Carrero, Margely, MD   30 mL at 02/16/24 2033   polyethylene glycol (MIRALAX / GLYCOLAX) packet 17 g  17 g Oral Daily Peterson Ao, MD   17 g at 02/17/24 0815   risperiDONE (RISPERDAL) tablet 6 mg  6 mg Oral QHS Carrion-Carrero, Margely, MD   6 mg at 02/16/24 2100   sertraline (ZOLOFT) tablet 50 mg  50 mg Oral Daily Carrion-Carrero, Karle Starch, MD   50 mg at 02/17/24 0817   traZODone (DESYREL) tablet 50 mg  50 mg Oral QHS PRN Carrion-Carrero, Karle Starch, MD       PTA Medications: Medications Prior to Admission  Medication Sig Dispense Refill Last Dose/Taking   benztropine (COGENTIN) 1 MG tablet Take 1 tablet (1 mg total) by mouth at bedtime.      busPIRone (BUSPAR) 10 MG tablet Take 1 tablet (10 mg total) by mouth 3 (three) times daily.      divalproex (DEPAKOTE ER) 500 MG 24 hr tablet Take 1 tablet (500 mg total) by mouth at bedtime.      risperiDONE (  RISPERDAL) 3 MG tablet Take 2 tablets (6 mg total) by mouth at bedtime.      sertraline (ZOLOFT) 50 MG tablet Take 1 tablet (50 mg total) by mouth daily.      traZODone (DESYREL) 50 MG tablet Take 1 tablet (50 mg total) by mouth at bedtime as needed for sleep.       Patient Stressors: Health problems   Other: Paranoid    Patient Strengths: Ability for insight  Average or above average intelligence  Communication skills  Motivation for treatment/growth  Supportive family/friends   Treatment Modalities: Medication Management, Group therapy, Case management,  1 to 1 session with clinician, Psychoeducation, Recreational therapy.   Physician Treatment Plan for Primary Diagnosis: Schizoaffective disorder, bipolar type (HCC) Long Term Goal(s):     Short Term Goals:    Medication  Management: Evaluate patient's response, side effects, and tolerance of medication regimen.  Therapeutic Interventions: 1 to 1 sessions, Unit Group sessions and Medication administration.  Evaluation of Outcomes: Progressing  Physician Treatment Plan for Secondary Diagnosis: Principal Problem:   Schizoaffective disorder, bipolar type (HCC) Active Problems:   Paranoid disorder (HCC)   Obsessive-compulsive behavior  Long Term Goal(s):     Short Term Goals:       Medication Management: Evaluate patient's response, side effects, and tolerance of medication regimen.  Therapeutic Interventions: 1 to 1 sessions, Unit Group sessions and Medication administration.  Evaluation of Outcomes: Progressing   RN Treatment Plan for Primary Diagnosis: Schizoaffective disorder, bipolar type (HCC) Long Term Goal(s): Knowledge of disease and therapeutic regimen to maintain health will improve  Short Term Goals: Ability to remain free from injury will improve, Ability to participate in decision making will improve, and Ability to disclose and discuss suicidal ideas  Medication Management: RN will administer medications as ordered by provider, will assess and evaluate patient's response and provide education to patient for prescribed medication. RN will report any adverse and/or side effects to prescribing provider.  Therapeutic Interventions: 1 on 1 counseling sessions, Psychoeducation, Medication administration, Evaluate responses to treatment, Monitor vital signs and CBGs as ordered, Perform/monitor CIWA, COWS, AIMS and Fall Risk screenings as ordered, Perform wound care treatments as ordered.  Evaluation of Outcomes: Progressing   LCSW Treatment Plan for Primary Diagnosis: Schizoaffective disorder, bipolar type (HCC) Long Term Goal(s): Safe transition to appropriate next level of care at discharge, Engage patient in therapeutic group addressing interpersonal concerns.  Short Term Goals: Engage  patient in aftercare planning with referrals and resources, Increase emotional regulation, Identify triggers associated with mental health/substance abuse issues, and Increase skills for wellness and recovery  Therapeutic Interventions: Assess for all discharge needs, 1 to 1 time with Social worker, Explore available resources and support systems, Assess for adequacy in community support network, Educate family and significant other(s) on suicide prevention, Complete Psychosocial Assessment, Interpersonal group therapy.  Evaluation of Outcomes: Progressing   Progress in Treatment: Attending groups: Yes. Participating in groups: Yes. Taking medication as prescribed: Yes. Toleration medication: Yes. Family/Significant other contact made: Yes, contacted Mother, Jerson Furukawa Patient understands diagnosis: No. Discussing patient identified problems/goals with staff: No. Medical problems stabilized or resolved: Yes. Denies suicidal/homicidal ideation: Yes. Issues/concerns per patient self-inventory: No.   New problem(s) identified: No   New Short Term/Long Term Goal(s):     medication stabilization, elimination of SI thoughts, development of comprehensive mental wellness plan.    Patient Goals:  "I want to fix my fear of being watched."   Discharge Plan or Barriers:  Patient  recently admitted. CSW will continue to follow and assess for appropriate referrals and possible discharge planning.    Reason for Continuation of Hospitalization: Medication stabilization Suicidal ideation Paranoia   Estimated Length of Stay:  5 - 7 days Last 3 [place] Suicide Severity Risk Score: Flowsheet Row [facility] (Current[facility]09/2024 in [facility] ED from 02/08/2024 in [facility] Emergency Department at [facility]  C-SSRS RISK CATEGORY No Risk High Risk High Risk       Last [facility] 2/9  Scores:    02/11/2024   11:55 AM  Depression screen PHQ 2/9  Decreased Interest 1  Down, Depressed, Hopeless 2  PHQ - 2 Score 3  Altered sleeping 1  Tired, decreased energy 1  Change in appetite 1  Feeling bad or failure about yourself  0  Trouble concentrating 1  Moving slowly or fidgety/restless 1  Suicidal thoughts 0  PHQ-9 Score 8  Difficult doing work/chores Somewhat difficult    Scribe for Treatment Team: Jacinta Shoe, LCSW 02/17/2024 9:44 AM

## 2024-02-17 NOTE — Progress Notes (Signed)
 Collateral contact - Debbie Golberg (mom) [phone]  10:40 AM - CSW called; mom's daughter (patient's sister) answered and said that mom is not available right now, and advised to call later.   Read Drivers, LCSWA 02/17/2024

## 2024-02-17 NOTE — Plan of Care (Signed)

## 2024-02-18 MED ORDER — DIVALPROEX SODIUM ER 500 MG PO TB24: 1000.0000 mg | ORAL_TABLET | Freq: Every day | ORAL | 0 refills | Status: DC

## 2024-02-18 MED ORDER — RISPERIDONE 3 MG PO TABS: 6.0000 mg | ORAL_TABLET | Freq: Every day | ORAL | 0 refills | Status: DC

## 2024-02-18 MED ORDER — DOCUSATE SODIUM 100 MG PO CAPS
100.0000 mg | ORAL_CAPSULE | Freq: Every day | ORAL | 0 refills | Status: AC
Start: 2024-02-19 — End: 2024-03-20

## 2024-02-18 MED ORDER — SERTRALINE HCL 50 MG PO TABS: 50.0000 mg | ORAL_TABLET | Freq: Every day | ORAL | 0 refills | Status: DC

## 2024-02-18 MED ORDER — BENZTROPINE MESYLATE 0.5 MG PO TABS: 0.5000 mg | ORAL_TABLET | Freq: Two times a day (BID) | ORAL | 0 refills | Status: DC

## 2024-02-18 MED ORDER — POLYETHYLENE GLYCOL 3350 17 G PO PACK
17.0000 g | PACK | Freq: Every day | ORAL | Status: DC | PRN
Start: 2024-02-18 — End: 2024-03-11

## 2024-02-18 MED ORDER — BUSPIRONE HCL 10 MG PO TABS
10.0000 mg | ORAL_TABLET | Freq: Three times a day (TID) | ORAL | 0 refills | Status: DC
Start: 2024-02-18 — End: 2024-03-11

## 2024-02-18 MED ORDER — HYDROXYZINE HCL 25 MG PO TABS: 25.0000 mg | ORAL_TABLET | Freq: Three times a day (TID) | ORAL | 0 refills | Status: DC | PRN

## 2024-02-18 NOTE — Progress Notes (Signed)
   02/18/24 0745  Psych Admission Type (Psych Patients Only)  Admission Status Voluntary  Psychosocial Assessment  Patient Complaints Suspiciousness;Worrying  Eye Contact Fair  Facial Expression Anxious  Affect Preoccupied  Speech Logical/coherent  Interaction Assertive  Motor Activity Other (Comment) (WNL)  Appearance/Hygiene Unremarkable  Behavior Characteristics Appropriate to situation;Anxious  Mood Anxious  Thought Process  Coherency Circumstantial  Content Paranoia  Delusions None reported or observed  Perception WDL  Hallucination None reported or observed  Judgment Limited  Confusion None  Danger to Self  Current suicidal ideation? Denies  Agreement Not to Harm Self Yes  Description of Agreement verbal  Danger to Others  Danger to Others None reported or observed

## 2024-02-18 NOTE — Progress Notes (Signed)
  [facility] Adult Case Management Discharge Plan :  Will you be returning to the same living situation after discharge:  Yes,  patient will return to his mom's home, Cecile Guevara ([address] [place], [place]) At discharge, do you have transportation home?: Yes,  CSW arranged [facility] for 5 PM Do you have the ability to pay for your medications:  No, patient will receive samples.  Release of information consent forms completed and in the chart;  Patient's signature needed at discharge.  Patient to Follow up at:  Follow-up Information     [facility]. Go on 02/23/2024.   Specialty: Behavioral Health Why: Please go to this provider on Tuesday, 02/23/2024 at 7 AM for medication management services.  If the time presented is not feasible, you can walk-in Monday through Friday, arrive by 7:00 AM for an assessment.  You will need to go at this time as a walk-in patient to become established with this provider. Contact information: [address] [place] [address] [phone]        [facility] Go on 02/24/2024.   Specialty: Professional Counselor Why: Please go to this provider on 4/2 at 9 AM for therapy services.  If the time presented is not feasible, you can walk-in Monday through Friday, from 9 AM to 1 PM. Contact information: [address] [place] [address] [phone]                 Next level of care provider has access to [facility] Link:no  Safety Planning and Suicide Prevention discussed: Yes,  Luccas Towell (mom) [phone]  Has patient been referred to the Quitline?: Patient does not use tobacco/nicotine products  Patient has been referred for addiction treatment: No known substance use disorder.   Alfredia Ferguson Clemma Johnsen, LCSWA 02/18/2024, 12:38 PM
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
## 2024-02-18 NOTE — Progress Notes (Signed)
 Collateral contact - Mackenzy Eisenberg (mom) [phone]  Mom said that patient can leave the hospital at 5:00 PM, and she will be home when he arrives,   Mom asked for samples.  Pharmacy:  [facility], [address] [place], [address], [place], [place] [address].  Dr. Birdena Crandall confirmed that patient will receive samples and medication was sent to the above pharmacy.     Brason Berthelot, LCSWA 02/18/2024
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
## 2024-02-18 NOTE — Plan of Care (Signed)
  Problem: Education: Goal: Knowledge of [facility] Education information/materials will improve 02/18/2024 0949 by Thersa Salt, RN Outcome: Adequate for Discharge 02/18/2024 0831 by Thersa Salt, RN Outcome: Adequate for Discharge Goal: Emotional status will improve 02/18/2024 0949 by Thersa Salt, RN Outcome: Adequate for Discharge 02/18/2024 0831 by Thersa Salt, RN Outcome: Progressing Goal: Mental status will improve 02/18/2024 0949 by Thersa Salt, RN Outcome: Adequate for Discharge 02/18/2024 0831 by Thersa Salt, RN Outcome: Progressing Goal: Verbalization of understanding the information provided will improve 02/18/2024 0949 by Thersa Salt, RN Outcome: Adequate for Discharge 02/18/2024 0831 by Thersa Salt, RN Outcome: Progressing   Problem: Activity: Goal: Interest or engagement in activities will improve Outcome: Adequate for Discharge Goal: Sleeping patterns will improve Outcome: Adequate for Discharge

## 2024-02-18 NOTE — Plan of Care (Signed)
  Problem: Education: Goal: Knowledge of [facility] Education information/materials will improve Outcome: Adequate for Discharge Goal: Emotional status will improve Outcome: Progressing Goal: Mental status will improve Outcome: Progressing Goal: Verbalization of understanding the information provided will improve Outcome: Progressing

## 2024-02-18 NOTE — Transportation (Signed)
 02/18/2024  Rodney Thomas DOB: [birth year] MRN: [id number]   RIDER WAIVER AND RELEASE OF LIABILITY  For the purposes of helping with transportation needs, [facility] partners with outside transportation providers (taxi companies, [place], Catering manager.) to give [employer] patients or other approved people the choice of on-demand rides [employer]") to our buildings for non-emergency visits.  By using [employer], I, the person signing this document, on behalf of myself and/or any legal minors (in my care using the [employer]), agree:  Science writer given to me are supplied by independent, outside transportation providers who do not work for, or have any affiliation with, [employer]. [place] is not a transportation company. [facility] has no control over the quality or safety of the rides I get using [employer]. [place] has no control over whether any outside ride will happen on time or not. [facility] gives no guarantee on the reliability, quality, safety, or availability on any rides, or that no mistakes will happen. I know and accept that traveling by vehicle (car, truck, SVU, Zenaida Niece, bus, taxi, etc.) has risks of serious injuries such as disability, being paralyzed, and death. I know and agree the risk of using [employer] is mine alone, and not [employer]. Transport Services are provided "as is" and as are available. The transportation providers are in charge for all inspections and care of the vehicles used to provide these rides. I agree not to take legal action against [facility], its agents, employees, officers, directors, representatives, insurers, attorneys, assigns, successors, subsidiaries, and affiliates at any time for any reasons related directly or indirectly to using [employer]. I also agree not to take legal action against [facility] or its affiliates for any injury, death, or damage to property caused by or related to using  [employer]. I have read this Waiver and Release of Liability, and I understand the terms used in it and their legal meaning. This Waiver is freely and voluntarily given with the understanding that my right (or any legal minors) to legal action against [place] relating to [employer] is knowingly given up to use these services.   I attest that I read the Ride Waiver and Release of Liability to Rodney Thomas, gave Mr. Rozman the opportunity to ask questions and answered the questions asked (if any). I affirm that Cleophas Yoak then provided consent for assistance with transportation.     Daci Stubbe, LCSWA 02/18/2024

## 2024-02-18 NOTE — Discharge Instructions (Addendum)
-  Follow-up with your outpatient psychiatric provider -instructions on appointment date, time, and address (location) are provided to you in discharge paperwork.  -Take your psychiatric medications as prescribed at discharge - instructions are provided to you in the discharge paperwork As we discussed, we will provide printed prescriptions at discharge - take these to the pharmacy of your choosing to have your medications filled.   -Follow-up with outpatient primary care doctor and other specialists -for management of preventative medicine and any chronic medical disease.  -Recommend abstinence from alcohol, tobacco, and other illicit drug use at discharge.   -If your psychiatric symptoms recur, worsen, or if you have side effects to your psychiatric medications, call your outpatient psychiatric provider, 911, 988 or go to the nearest emergency department.  -If suicidal thoughts occur, call your outpatient psychiatric provider, 911, 988 or go to the nearest emergency department.  Naloxone (Narcan) can help reverse an overdose when given to the victim quickly.  [facility] offers free naloxone kits and instructions/training on its use.  Add naloxone to your first aid kit and you can help save a life.   Pick up your free kit at the following locations:   [place]:  [facility], [address] [place] [address] [phone]) [facility] [address] [place] [address] [phone]) [facility] [address] [place] [address]  [place]: [facility] [address] [place] [address] ([phone]) [facility] [address] [place] [address] [phone])

## 2024-02-18 NOTE — Group Note (Signed)
 LCSW Group Therapy Note   Group Date: 02/18/2024 Start Time: 1100 End Time: 1200   Participation:  did not attend   Type of Therapy:  Group Therapy  Topic:  Stronger Together: Building Healthy Relationships  Objective:  To explore loneliness, boundaries, and safe ways to build relationships.  Goals: Recognize healthy vs. unhealthy relationships. Learn safe ways to connect with others. Strengthen communication and [employer].  Summary:  Participants discussed loneliness, healthy connections, and setting boundaries. They explored safe ways to meet people and shared personal experiences. Key insights were reinforced through discussion and quotes.  Therapeutic Modalities Used: Elements of Cognitive Behavioral Therapy (CBT) - Identifying unhealthy relationship patterns, challenging negative thoughts about connection. Elements of Dialectical Behavior Therapy (DBT) - Interpersonal effectiveness, setting and maintaining boundaries. Supportive Group Therapy - Peer discussion, shared experiences, and emotional validation.   Alla Feeling, LCSWA 02/18/2024  1:44 PM

## 2024-02-18 NOTE — [facility] Suicide Risk Assessment (Signed)
 [facility] Discharge Suicide Risk Assessment  Principal Problem: Schizoaffective disorder, bipolar type [facility]) Discharge Diagnoses: Principal Problem:   Schizoaffective disorder, bipolar type (HCC) Active Problems:   Paranoid disorder (HCC)   Obsessive-compulsive behavior  Reason for Admission:   Rodney Thomas is a 33 y.o., male with past psychiatric history of schizoaffective, bipolar type, OCD who presents to the [facility] Voluntary from [facility] Emergency Department for evaluation and management of worsening paranoia.   Hospital Summary  During the patient's hospitalization, patient had extensive initial psychiatric evaluation, and follow-up psychiatric evaluations every day.  Psychiatric diagnoses provided upon initial assessment:  Schizoaffective Disorder, Bipolar Type  Paranoid Disorder  Obsessive-compulsive behavior   Patient's psychiatric medications were adjusted on admission:  - Continued Buspar 10 mg 3 times daily for anxiety [facility]  - Continued Risperdal 6 mg daily at bedtime for psychosis/paranoia ( home medication) - Continued Depakote ER 500 mg at bedtime for mood stabilization - Started Zoloft 50 mg daily for depression and anxiety   - Continued Cogentin 1 mg daily at bedtime for extra pyramidal symptoms prophylaxis while on antipsychotic medication   During the hospitalization, other adjustments were made to the patient's psychiatric medication regimen:  - Increased Depakote ER 1000 mg at bedtime for mood stabilization  - Changed Cogentin to 0.5 mg every 12 hours  Patient's care was discussed during the interdisciplinary team meeting every day during the hospitalization.  The patient denied having side effects to prescribed psychiatric medication.  Gradually, patient started adjusting to milieu. The patient was evaluated each day by a clinical provider to ascertain response to treatment. Improvement was noted by  the patient's report of decreasing symptoms, improved sleep and appetite, affect, medication tolerance, behavior, and participation in unit programming.  Patient was asked each day to complete a self inventory noting mood, mental status, pain, new symptoms, anxiety and concerns.    Symptoms were reported as significantly decreased or resolved completely by discharge.   On day of discharge, the patient reports that their mood is stable. The patient denied having suicidal thoughts for more than 48 hours prior to discharge. Patient denies having homicidal thoughts.  Patient denies having auditory hallucinations.  Patient denies any visual hallucinations. Patient reported paranoia was 1 out 10, with 10 being most severe. He states during hospitalization he has been less paranoid, more social and interacting more on the floor. He reports this a big improvement for him and he feels better than prior discharge from [facility]. The patient was motivated to continue taking medication with a goal of continued improvement in mental health. Patient stated he plans to go back to his mothers home post discharge.   The patient reports their target psychiatric symptoms of paranoia and suicidal ideations in the context of paranoid symptoms responded well to the psychiatric medications, and the patient reports overall benefit other psychiatric hospitalization. Supportive psychotherapy was provided to the patient. The patient also participated in regular group therapy while hospitalized. Coping skills, problem solving as well as relaxation therapies were also part of the unit programming.  Labs were reviewed with the patient, and abnormal results were discussed with the patient.  The patient is able to verbalize their individual safety plan to this provider.  # It is recommended to the patient to continue psychiatric medications as prescribed, after discharge from the hospital.    # It is recommended to the patient to follow up  with your outpatient psychiatric provider and PCP.  #  It was discussed with the patient, the impact of alcohol, drugs, tobacco have been there overall psychiatric and medical wellbeing, and total abstinence from substance use was recommended the patient.ed.  # Prescriptions provided or sent directly to preferred pharmacy at discharge. Patient agreeable to plan. Given opportunity to ask questions. Appears to feel comfortable with discharge.    # In the event of worsening symptoms, the patient is instructed to call the crisis hotline, 911 and or go to the nearest ED for appropriate evaluation and treatment of symptoms. To follow-up with primary care provider for other medical issues, concerns and or health care needs  # Patient was discharged home with a plan to follow up as noted below.   Total Time spent with patient: 20 minutes  Musculoskeletal: Strength & Muscle Tone: within normal limits Gait & Station: normal Patient leans: N/A  Psychiatric Specialty Exam  Presentation  General Appearance: Appropriate for Environment; Casual; Fairly Groomed  Eye Contact:Good  Speech:Normal Rate; Clear and Coherent  Speech Volume:Normal  Handedness:-- (not assessed)   Mood and Affect  Mood:Euthymic  Duration of Depression Symptoms: N/A  Affect:Appropriate; Congruent; Full Range   Thought Process  Thought Processes:Linear  Descriptions of Associations:Intact  Orientation:Full (Time, Place and Person)  Thought Content:Logical  History of Schizophrenia/Schizoaffective disorder:Yes  Duration of Psychotic Symptoms:Greater than six months  Hallucinations:Hallucinations: None  Ideas of Reference:Paranoia (paranoia reported to be at low level, at baseline, able to interact with others)  Suicidal Thoughts:Suicidal Thoughts: No  Homicidal Thoughts:Homicidal Thoughts: No   Sensorium  Memory:Immediate Good; Recent Good; Remote Good  Judgment:Fair  Insight:Fair   Executive  Functions  Concentration:Fair  Attention Span:Fair  Recall:Good  Fund of Knowledge:Good  Language:Good   Psychomotor Activity  Psychomotor Activity:Psychomotor Activity: Normal   Assets  Assets:Communication Skills; Desire for Improvement; Housing; Social Support   Sleep  Sleep:Sleep: Fair Number of Hours of Sleep: 7.5   Physical Exam: Physical Exam Constitutional:      Appearance: Normal appearance.  Eyes:     Conjunctiva/sclera: Conjunctivae normal.  Pulmonary:     Effort: Pulmonary effort is normal.  Musculoskeletal:        General: Normal range of motion.  Neurological:     Mental Status: He is alert and oriented to person, place, and time.  Psychiatric:        Attention and Perception: He does not perceive auditory or visual hallucinations.        Mood and Affect: Mood is anxious. Mood is not depressed. Affect is not angry.        Speech: Speech is not rapid and pressured.        Behavior: Behavior is not agitated, aggressive or combative. Behavior is cooperative.        Thought Content: Thought content is paranoid. Thought content is not delusional. Thought content does not include homicidal or suicidal ideation. Thought content does not include homicidal or suicidal plan.    Review of Systems  Constitutional:  Negative for chills and fever.  Respiratory:  Negative for cough.   Cardiovascular:  Negative for chest pain.  Gastrointestinal:  Negative for constipation, nausea and vomiting.  Neurological:  Negative for headaches.  Psychiatric/Behavioral:  Negative for depression, hallucinations, substance abuse and suicidal ideas. The patient is nervous/anxious. The patient does not have insomnia.    Blood pressure 124/80, pulse 80, temperature (!) 97.4 F (36.3 C), temperature source Oral, resp. rate 16, height 5\' 10"  (1.778 m), weight 77.6 kg, SpO2 99%. Body mass index is  24.54 kg/m.  Mental Status Per Nursing Assessment::   On Admission:   NA  Demographic Factors:  Male, Low socioeconomic status, and Unemployed  Loss Factors: Legal issues  Historical Factors: Family history of mental illness or substance abuse  Risk Reduction Factors:   Sense of responsibility to family, Religious beliefs about death, and Living with another person, especially a relative  Continued Clinical Symptoms:  Severe Anxiety and/or Agitation Schizophrenia:   Paranoid or undifferentiated type More than one psychiatric diagnosis  Cognitive Features That Contribute To Risk:  None    Suicide Risk:  Mild:  There are no identifiable suicide plans, no associated intent, mild dysphoria and related symptoms, good self-control (both objective and subjective assessment), few other risk factors, and identifiable protective factors, including available and accessible social support.    Follow-up Information     [facility]. Go on 02/23/2024.   Specialty: Behavioral Health Why: Please go to this provider on Tuesday, 02/23/2024 at 7 AM for medication management services.  If the time presented is not feasible, you can walk-in Monday through Friday, arrive by 7:00 AM for an assessment.  You will need to go at this time as a walk-in patient to become established with this provider. Contact information: [address] [place] [address] [phone]        [facility]. Go on 02/24/2024.   Specialty: Professional Counselor Why: Please go to this provider on 4/2 at 9 AM for therapy services.  If the time presented is not feasible, you can walk-in Monday through Friday, from 9 AM to 1 PM. Contact information: [address] [place] [address] [phone]                Plan Of Care/Follow-up recommendations:  Activity: as tolerated  Diet: heart healthy  Other: -Follow-up with your outpatient psychiatric provider -instructions on appointment date, time, and address  (location) are provided to you in discharge paperwork.  -Take your psychiatric medications as prescribed at discharge - instructions are provided to you in the discharge paperwork  -Follow-up with outpatient primary care doctor and other specialists -for management of preventative medicine and chronic medical disease: None   -Testing: Follow-up with outpatient provider for abnormal lab results:  VPA Levels:  - 3/18: < 10  - 3/20 22  - 3/24 40  Patient will need check at next psychiatric follow-up appointment, to assess for therapeutic level    -If you are prescribed an atypical antipsychotic medication, we recommend that your outpatient psychiatrist follow routine screening for side effects within 3 months of discharge, including monitoring: AIMS scale, height, weight, blood pressure, fasting lipid panel, HbA1c, and fasting blood sugar.   -Recommend total abstinence from alcohol, tobacco, and other illicit drug use at discharge.   -If your psychiatric symptoms recur, worsen, or if you have side effects to your psychiatric medications, call your outpatient psychiatric provider, 911, [phone] or go to the nearest emergency department.  -If suicidal thoughts occur, immediately call your outpatient psychiatric provider, 911, [phone] or go to the nearest emergency department.  Signed: Peterson Ao, MD 02/18/2024, 9:37 AM

## 2024-02-25 ENCOUNTER — Telehealth (HOSPITAL_COMMUNITY): Payer: Self-pay | Admitting: Student in an Organized Health Care Education/Training Program

## 2024-02-25 NOTE — Telephone Encounter (Addendum)
 Patient was contacted by phone on 02/25/24  regarding concerns about his psychotropic medication. Patient was contacted by phone to clarify instructions on how and when to take prescribed psychotropic medication. Instructions were explained clearly, and the patient verbalized understanding. His psychotropic medications reviewed from his discharge from [facility] on 02/18/2024 include: BuSpar 3 times daily Hydroxyzine 25 mg 3 times daily as needed for anxiety Cogentin 0.5 mg every 12 hours Depakote 1000 mg nightl  Zoloft 50 mg daily  Dr. Liston Alba, MD PGY-2, Psychiatry Residency

## 2024-03-11 ENCOUNTER — Ambulatory Visit (INDEPENDENT_AMBULATORY_CARE_PROVIDER_SITE_OTHER): Admitting: Physician Assistant

## 2024-03-11 VITALS — BP 120/71 | HR 64 | Temp 98.3°F | Ht 70.0 in | Wt 178.0 lb

## 2024-03-11 DIAGNOSIS — K5903 Drug induced constipation: Secondary | ICD-10-CM | POA: Diagnosis not present

## 2024-03-11 DIAGNOSIS — R4681 Obsessive-compulsive behavior: Secondary | ICD-10-CM

## 2024-03-11 DIAGNOSIS — F411 Generalized anxiety disorder: Secondary | ICD-10-CM

## 2024-03-11 DIAGNOSIS — F250 Schizoaffective disorder, bipolar type: Secondary | ICD-10-CM

## 2024-03-11 DIAGNOSIS — Z79899 Other long term (current) drug therapy: Secondary | ICD-10-CM

## 2024-03-11 MED ORDER — DIVALPROEX SODIUM ER 500 MG PO TB24: 1000.0000 mg | ORAL_TABLET | Freq: Every day | ORAL | 1 refills | Status: DC

## 2024-03-11 MED ORDER — RISPERIDONE 3 MG PO TABS: 6.0000 mg | ORAL_TABLET | Freq: Every day | ORAL | 1 refills | Status: DC

## 2024-03-11 MED ORDER — POLYETHYLENE GLYCOL 3350 17 G PO PACK: 17.0000 g | PACK | Freq: Every day | ORAL | Status: DC | PRN

## 2024-03-11 MED ORDER — MILK OF MAGNESIA 7.75 % PO SUSP: 5.0000 mL | Freq: Every day | ORAL | 0 refills | Status: DC | PRN

## 2024-03-11 MED ORDER — BUSPIRONE HCL 10 MG PO TABS: 10.0000 mg | ORAL_TABLET | Freq: Three times a day (TID) | ORAL | 1 refills | Status: DC

## 2024-03-11 MED ORDER — SERTRALINE HCL 50 MG PO TABS: 50.0000 mg | ORAL_TABLET | Freq: Every day | ORAL | 1 refills | Status: DC

## 2024-03-11 MED ORDER — HYDROXYZINE HCL 25 MG PO TABS: 25.0000 mg | ORAL_TABLET | Freq: Three times a day (TID) | ORAL | 1 refills | Status: DC | PRN

## 2024-03-11 MED ORDER — BENZTROPINE MESYLATE 0.5 MG PO TABS: 0.5000 mg | ORAL_TABLET | Freq: Two times a day (BID) | ORAL | 1 refills | Status: DC

## 2024-03-11 NOTE — Progress Notes (Signed)
 Psychiatric Initial Adult Assessment   Patient Identification: Rodney Thomas MRN:  [id number] Date of Evaluation:  03/11/2024 Referral Source: Follow up appointment following discharge from [facility] Chief Complaint:   Chief Complaint  Patient presents with   Establish Care   Medication Management   Visit Diagnosis:    ICD-10-CM   1. Drug-induced constipation  K59.03 polyethylene glycol (MIRALAX  / GLYCOLAX ) 17 g packet    magnesium  hydroxide (MILK OF MAGNESIA) 400 MG/5ML suspension    2. Schizoaffective disorder, bipolar type (HCC)  F25.0 benztropine  (COGENTIN ) 0.5 MG tablet    divalproex  (DEPAKOTE  ER) 500 MG 24 hr tablet    risperiDONE  (RISPERDAL ) 3 MG tablet    3. Obsessive-compulsive behavior  R46.81 sertraline  (ZOLOFT ) 50 MG tablet    4. Anxiety state  F41.1 busPIRone  (BUSPAR ) 10 MG tablet    hydrOXYzine  (ATARAX ) 25 MG tablet    sertraline  (ZOLOFT ) 50 MG tablet    5. On valproic acid  therapy  Z79.899 Valproic acid  level    6. Long term current use of antipsychotic medication  Z79.899 CBC with Differential/Platelet    Lipid panel    Comprehensive Metabolic Panel (CMET)    Hemoglobin A1c      History of Present Illness:    Rodney Thomas is a 33 year old male with a past psychiatric history significant for schizoaffective disorder (bipolar type), obsessive-compulsive disorder, and anxiety who presents to [facility] to establish psychiatric care and for medication management.  Patient presents to the encounter for a follow-up appointment following discharge from [facility].  Since being discharged from the hospital, patient continues to endorse depression due to his upcoming court appointments (May 21st).  Patient reports that he has a total of 3 misdemeanors that told him to 7 months in jail.  Patient does not want to go back to jail stating that he is fearful that the police will plan evidence on him to make him  stay in jail for longer.  Per chart review, who presented to [facility] from [facility] ED for evaluation and management of worsening paranoia.  Patient was admitted to [facility] on 02/11/2024 and discharged on 02/18/2024 on the following psychiatric medications:  Benztropine  0.5 mg every 12 hours Buspirone  10 mg 3 times daily Divalproex  1000 mg 24-hour tablet at bedtime Hydroxyzine  25 mg 3 times daily as needed Risperidone  6 mg at bedtime Sertraline  50 mg daily  Patient endorses increased depression attributed to his upcoming court date.  Patient rates his depression as 7 out of 10 with 10 being most severe.  Patient reports that he was on suicide watch while in jail and also expressed experiencing claustrophobia while in his self.  Patient endorses depressive episodes 5 days out of the week with symptoms occurring off and on throughout the day.  Patient endorses the following depressive symptoms: feelings of sadness, lack of concentration, irritability, feelings of guilt/worthlessness, and hopelessness.  Patient denies lack of motivation, crying spells, or decreased energy.  He reports that his depression is alleviated by food.  He reports that his depressive episodes are worsened by his upcoming court date.  In addition to his depression, patient endorses anxiety and rates his anxiety as 5 out of 10.  Patient endorses the following stressors:his upcoming court days and not being able to see his child again.  He reports that the court gave full custody to his ex.  Patient denies experiencing auditory or visual hallucinations but does endorse paranoia that he experiences  only at nighttime.  Due to his current charges, patient feels that the law is out to get him.  Since taking his medications, patient reports that his medications make him feel constipated.  Patient denies experiencing stiffness, rigidity, or involuntary movements while on his medications.  Patient has been hospitalized in the past due to  mental health.  Prior to his most recent hospitalization, patient was hospitalized at [facility] due to worsening psychosis and mania.  Patient denies a past history of suicide attempts.  A PHQ-9 screen was performed with the patient scoring a 9.  A GAD-7 screen was also performed with the patient scoring a 12.  Patient is alert and oriented x 4, calm, cooperative, and fully engaged in conversation during the encounter.  Patient endorses pretty good mood.  Patient exhibits depressed mood with appropriate affect.  Patient denies suicidal ideations.  He endorses passive homicidal ideations towards his ex-partner but denies having a plan or intent.  Patient denies auditory or visual hallucinations and does not appear to be responding to internal/external stimuli.  Patient endorses paranoia but denies delusional thoughts.  Patient endorses fair sleep and receives on average 6 hours of sleep per night.  Patient endorses poor appetite and eats on average 1 meal per day.  Patient denies alcohol consumption, tobacco use, or illicit drug use.  Associated Signs/Symptoms: Depression Symptoms:  depressed mood, anhedonia, hypersomnia, feelings of worthlessness/guilt, anxiety, panic attacks, loss of energy/fatigue, decreased labido, increased appetite, decreased appetite, (Hypo) Manic Symptoms:  Distractibility, Elevated Mood, Financial Extravagance, Grandiosity, Labiality of Mood, Anxiety Symptoms:  Agoraphobia, Excessive Worry, Obsessive Compulsive Symptoms:   Counting, Social Anxiety, Specific Phobias, Psychotic Symptoms:  Paranoia, PTSD Symptoms: Negative  Past Psychiatric History:  No prior psychiatric history significant for schizoaffective disorder (bipolar type), paranoid disorder, and obsessive-compulsive disorder  Patient was recently hospitalized at [facility] on 02/16/2024. - Presented was admitted to [facility] from [facility] ED after presenting for evaluation and management of  worsening paranoia - Patient was also recently hospitalized on 01/15/2024 due to worsening psychosis and mania.  During his hospitalization, patient was preoccupied with delusional belief and reported auditory hallucinations that were commanding him to end his life.  Patient denied a past history of suicide attempts; however, patient reports that he has had thoughts of harming himself in the past.  Patient denies a past history of homicide attempts  Previous Psychotropic Medications: Yes , patient has been placed on the following psychiatric medications Depakote , risperidone , and trazodone .  Substance Abuse History in the last 12 months:  No.  Consequences of Substance Abuse: Negative  Past Medical History:  Past Medical History:  Diagnosis Date   Chronic insomnia 01/16/2024   Obsessive-compulsive behavior 01/16/2024   Schizoaffective disorder, bipolar type (HCC) 01/16/2024   Seasonal allergies    Vitamin D  deficiency 01/17/2024    Past Surgical History:  Procedure Laterality Date   APPENDECTOMY     ORIF RADIAL FRACTURE Left 03/17/2014   Procedure: OPEN REDUCTION INTERNAL FIXATION (ORIF) RADIAL FRACTURE;  Surgeon: Adah Acron, MD;  Location: [facility] OR;  Service: Orthopedics;  Laterality: Left;  Open Reduction Internal Fixation Left Radial Shaft Fracture    Family Psychiatric History:  Patient denied a family history of psychiatric illness  Family history of suicide attempt: Patient denies Family history of homicide attempt: Patient denies Family history of substance abuse: Patient denies  Family History: No family history on file.  Social History:   Social History   Socioeconomic History   Marital  status: Single    Spouse name: Not on file   Number of children: Not on file   Years of education: Not on file   Highest education level: Not on file  Occupational History   Not on file  Tobacco Use   Smoking status: Never   Smokeless tobacco: Never  Substance and Sexual  Activity   Alcohol use: No   Drug use: No   Sexual activity: Not Currently  Other Topics Concern   Not on file  Social History Narrative   Not on file   Social Drivers of Health   Financial Resource Strain: Not on file  Food Insecurity: No Food Insecurity (02/11/2024)   Hunger Vital Sign    Worried About Running Out of Food in the Last Year: Never true    Ran Out of Food in the Last Year: Never true  Transportation Needs: Unmet Transportation Needs (02/11/2024)   PRAPARE - Administrator, Civil Service (Medical): Yes    Lack of Transportation (Non-Medical): No  Physical Activity: Not on file  Stress: Not on file  Social Connections: Not on file    Additional Social History: Patient denies social support.  Patient endorses having 1 child.  Patient endorses housing.  Patient denies current employment but states that he used to work at [employer].  Patient denies a past history of military experience.  Patient has a history of jail time stating that he was most recently placed in jail for 60 days due to domestic violence, communicating threats, and misdemeanor child abuse.  Patient endorses access to guns but states that day are in a safe.  Allergies:  No Known Allergies  Metabolic Disorder Labs: Lab Results  Component Value Date   HGBA1C 5.4 01/16/2024   MPG 108.28 01/16/2024   No results found for: "PROLACTIN" Lab Results  Component Value Date   CHOL 164 01/16/2024   TRIG 177 (H) 01/16/2024   HDL 57 01/16/2024   CHOLHDL 2.9 01/16/2024   VLDL 35 01/16/2024   LDLCALC 72 01/16/2024   Lab Results  Component Value Date   TSH 2.767 01/16/2024    Therapeutic Level Labs: No results found for: "LITHIUM" No results found for: "CBMZ" Lab Results  Component Value Date   VALPROATE 40 (L) 02/15/2024    Current Medications: Current Outpatient Medications  Medication Sig Dispense Refill   magnesium  hydroxide (MILK OF MAGNESIA) 400 MG/5ML suspension Take 5 mLs by mouth  daily as needed for mild constipation. 355 mL 0   benztropine  (COGENTIN ) 0.5 MG tablet Take 1 tablet (0.5 mg total) by mouth every 12 (twelve) hours. 60 tablet 1   busPIRone  (BUSPAR ) 10 MG tablet Take 1 tablet (10 mg total) by mouth 3 (three) times daily. 90 tablet 1   divalproex  (DEPAKOTE  ER) 500 MG 24 hr tablet Take 2 tablets (1,000 mg total) by mouth at bedtime. 60 tablet 1   docusate sodium  (COLACE) 100 MG capsule Take 1 capsule (100 mg total) by mouth daily. 30 capsule 0   hydrOXYzine  (ATARAX ) 25 MG tablet Take 1 tablet (25 mg total) by mouth 3 (three) times daily as needed for anxiety. 75 tablet 1   polyethylene glycol (MIRALAX  / GLYCOLAX ) 17 g packet Take 17 g by mouth daily as needed for moderate constipation. 30 each    risperiDONE  (RISPERDAL ) 3 MG tablet Take 2 tablets (6 mg total) by mouth at bedtime. 60 tablet 1   sertraline  (ZOLOFT ) 50 MG tablet Take 1 tablet (50 mg total)  by mouth daily. 30 tablet 1   No current facility-administered medications for this visit.    Musculoskeletal: Strength & Muscle Tone: within normal limits Gait & Station: normal Patient leans: N/A  Psychiatric Specialty Exam: Review of Systems  Psychiatric/Behavioral:  Positive for dysphoric mood and sleep disturbance. Negative for decreased concentration, hallucinations, self-injury and suicidal ideas. The patient is nervous/anxious. The patient is not hyperactive.     Blood pressure 120/71, pulse 64, temperature 98.3 F (36.8 C), temperature source Oral, height 5\' 10"  (1.778 m), weight 178 lb (80.7 kg), SpO2 100%.Body mass index is 25.54 kg/m.  General Appearance: Casual  Eye Contact:  Good  Speech:  Clear and Coherent and Normal Rate  Volume:  Normal  Mood:  Anxious and Depressed  Affect:  Congruent  Thought Process:  Coherent, Goal Directed, and Descriptions of Associations: Intact  Orientation:  Full (Time, Place, and Person)  Thought Content:  Paranoid Ideation  Suicidal Thoughts:  No   Homicidal Thoughts:  Yes.  without intent/plan  Memory:  Immediate;   Good Recent;   Good Remote;   Good  Judgement:  Fair  Insight:  Fair  Psychomotor Activity:  Normal  Concentration:  Concentration: Good and Attention Span: Good  Recall:  Good  Fund of Knowledge:Good  Language: Good  Akathisia:  No  Handed:  Left  AIMS (if indicated):  not done  Assets:  Communication Skills Desire for Improvement Housing  ADL's:  Intact  Cognition: WNL  Sleep:  Fair   Screenings: AUDIT    Flowsheet Row [facility]/2024 [facility] to Hosp-Admission (Discharged) [facility]  Alcohol Use Disorder Identification Test Final Score (AUDIT) 0 0 0      GAD-7    Flowsheet Row Office Visit from 03/11/2024 in [facility]  Total GAD-7 Score 12      PHQ2-9    Flowsheet Row Office Visit from 03/11/2024 in [facility] ED from 02/09/2024 in [facility]  PHQ-2 Total Score 2 3  PHQ-9 Total Score 9 8      Flowsheet Row Office Visit from 03/11/2024 in [facility] Admission (Discharged) from [facility] from 02/09/2024 in [facility]  C-SSRS RISK CATEGORY Moderate Risk No Risk High Risk       Assessment and Plan:   Rodney Thomas is a 33 year old male with a past psychiatric history significant for schizoaffective disorder (bipolar type), obsessive-compulsive disorder, and anxiety who presents to [facility] to establish psychiatric care and for medication management. Per chart review, who presented to [facility] from [facility] ED for evaluation and management of worsening paranoia.  Patient was  admitted to [facility] on 02/11/2024 and discharged on 02/18/2024 on the following psychiatric medications:  Benztropine  0.5 mg every 12 hours Buspirone  10 mg 3 times daily Divalproex  1000 mg 24-hour tablet at bedtime Hydroxyzine  25 mg 3 times daily as needed Risperidone  6 mg at bedtime Sertraline  50 mg daily  Patient presents to the encounter following his recent discharge from [facility].  Patient reports that he has been taking his medications regularly; however, he states that the medications make him constipated.  Provider recommended patient be placed on MiraLAX  and Milk of Magnesia as needed for the management  of his drug-induced constipation.  Patient was agreeable to recommendation.  Since being discharged from [facility], patient reports that he has been experiencing worsening depression attributed to the possibility of going back to jail over his 3 misdemeanors.  Patient expresses that he is fearful of going back to jail stating that he was on suicide watch the last time he was in jail.  Patient denies a past history of suicide attempts even while in jail.  A PHQ-9 screen was performed the patient scoring a 9.  A [place] Suicide Severity Rating Scale was performed with the patient is being considered moderate risk.  Patient denies suicidal ideations at this time and is able to contract for safety following the conclusion of the encounter.  Patient is agreeable to being set up with a licensed clinical social worker following the conclusion of the encounter.  In addition to his depression, patient endorses paranoia characterized by the belief that law enforcement will plant evidence on the patient resulting in a longer jail sentence.  Despite his ongoing depression, anxiety, and paranoia, patient denies the need for dosage adjustments at this time and would like to continue taking his medications as prescribed.  Patient denies experiencing any rigidity, deafness, involuntary movements while taking his  medications.  Patient's medications to be e-prescribed to pharmacy of choice.  Due to his use of Depakote  and risperidone , provider to obtain the following labs from the patient: Valproic acid  level, hemoglobin A1c, comprehensive metabolic panel, complete blood count with differential, and lipid profile.  Patient vocalized understanding.  Collaboration of Care: Medication Management AEB provider managing patient's psychiatric medications, Psychiatrist AEB patient being followed by mental health provider at this facility, and Referral or follow-up with counselor/therapist AEB patient to be set up with a licensed clinical social worker at this facility  Patient/Guardian was advised Release of Information must be obtained prior to any record release in order to collaborate their care with an outside provider. Patient/Guardian was advised if they have not already done so to contact the registration department to sign all necessary forms in order for us  to release information regarding their care.   Consent: Patient/Guardian gives verbal consent for treatment and assignment of benefits for services provided during this visit. Patient/Guardian expressed understanding and agreed to proceed.   1. Drug-induced constipation (Primary)  - polyethylene glycol (MIRALAX  / GLYCOLAX ) 17 g packet; Take 17 g by mouth daily as needed for moderate constipation.  Dispense: 30 each - magnesium  hydroxide (MILK OF MAGNESIA) 400 MG/5ML suspension; Take 5 mLs by mouth daily as needed for mild constipation.  Dispense: 355 mL; Refill: 0  2. Schizoaffective disorder, bipolar type (HCC)  - benztropine  (COGENTIN ) 0.5 MG tablet; Take 1 tablet (0.5 mg total) by mouth every 12 (twelve) hours.  Dispense: 60 tablet; Refill: 1 - divalproex  (DEPAKOTE  ER) 500 MG 24 hr tablet; Take 2 tablets (1,000 mg total) by mouth at bedtime.  Dispense: 60 tablet; Refill: 1 - risperiDONE  (RISPERDAL ) 3 MG tablet; Take 2 tablets (6 mg total) by mouth at  bedtime.  Dispense: 60 tablet; Refill: 1  3. Obsessive-compulsive behavior  - sertraline  (ZOLOFT ) 50 MG tablet; Take 1 tablet (50 mg total) by mouth daily.  Dispense: 30 tablet; Refill: 1  4. Anxiety state  - busPIRone  (BUSPAR ) 10 MG tablet; Take 1 tablet (10 mg total) by mouth 3 (three) times daily.  Dispense: 90 tablet; Refill: 1 - hydrOXYzine  (ATARAX ) 25 MG tablet; Take 1 tablet (25 mg total) by mouth 3 (three) times  daily as needed for anxiety.  Dispense: 75 tablet; Refill: 1 - sertraline  (ZOLOFT ) 50 MG tablet; Take 1 tablet (50 mg total) by mouth daily.  Dispense: 30 tablet; Refill: 1  5. On valproic acid  therapy  - Valproic acid  level; Future  6. Long term current use of antipsychotic medication Most recent EKG: QTc: 395 MS  - CBC with Differential/Platelet; Future - Lipid panel; Future - Comprehensive Metabolic Panel (CMET); Future - Hemoglobin A1c; Future  Patient to follow up in 6 weeks Provider spent a total of 55 minutes with the patient/reviewing patient's chart  Gates Kasal, PA 4/18/20256:29 PM

## 2024-03-14 ENCOUNTER — Encounter (HOSPITAL_COMMUNITY): Payer: Self-pay | Admitting: Physician Assistant

## 2024-04-22 ENCOUNTER — Encounter (HOSPITAL_COMMUNITY): Payer: 59 | Admitting: Physician Assistant

## 2024-05-10 ENCOUNTER — Ambulatory Visit (HOSPITAL_COMMUNITY): Payer: 59 | Admitting: Clinical

## 2024-09-09 ENCOUNTER — Emergency Department (HOSPITAL_COMMUNITY)
Admission: EM | Admit: 2024-09-09 | Discharge: 2024-09-10 | Disposition: A | Discharge status: Home / Self Care | Payer: Self-pay | Attending: Emergency Medicine | Admitting: Emergency Medicine

## 2024-09-09 DIAGNOSIS — R4689 Other symptoms and signs involving appearance and behavior: Secondary | ICD-10-CM | POA: Insufficient documentation

## 2024-09-09 DIAGNOSIS — R45851 Suicidal ideations: Secondary | ICD-10-CM | POA: Insufficient documentation

## 2024-09-10 ENCOUNTER — Ambulatory Visit (HOSPITAL_COMMUNITY)
Admission: EM | Admit: 2024-09-10 | Discharge: 2024-09-10 | Disposition: A | Discharge status: Other Acute Inpatient Hospital | Source: Intra-hospital | Attending: Psychiatry | Admitting: Psychiatry

## 2024-09-10 ENCOUNTER — Inpatient Hospital Stay: Admit: 2024-09-10 | Payer: Self-pay | Source: Home / Self Care | Admitting: Psychiatry

## 2024-09-10 ENCOUNTER — Inpatient Hospital Stay
Admission: AD | Admit: 2024-09-10 | Discharge: 2024-09-19 | DRG: 885 | Disposition: A | Discharge status: Home / Self Care | Payer: 59 | Source: Intra-hospital | Attending: Psychiatry | Admitting: Psychiatry

## 2024-09-10 ENCOUNTER — Other Ambulatory Visit: Payer: Self-pay

## 2024-09-10 ENCOUNTER — Encounter: Payer: Self-pay | Admitting: Behavioral Health

## 2024-09-10 ENCOUNTER — Encounter (HOSPITAL_COMMUNITY): Payer: Self-pay

## 2024-09-10 DIAGNOSIS — K5900 Constipation, unspecified: Secondary | ICD-10-CM | POA: Diagnosis present

## 2024-09-10 DIAGNOSIS — Z560 Unemployment, unspecified: Secondary | ICD-10-CM | POA: Insufficient documentation

## 2024-09-10 DIAGNOSIS — F251 Schizoaffective disorder, depressive type: Principal | ICD-10-CM | POA: Diagnosis present

## 2024-09-10 DIAGNOSIS — F250 Schizoaffective disorder, bipolar type: Secondary | ICD-10-CM | POA: Diagnosis present

## 2024-09-10 DIAGNOSIS — R4681 Obsessive-compulsive behavior: Secondary | ICD-10-CM

## 2024-09-10 DIAGNOSIS — Z79899 Other long term (current) drug therapy: Secondary | ICD-10-CM | POA: Diagnosis not present

## 2024-09-10 DIAGNOSIS — E559 Vitamin D deficiency, unspecified: Secondary | ICD-10-CM | POA: Diagnosis present

## 2024-09-10 DIAGNOSIS — F5104 Psychophysiologic insomnia: Secondary | ICD-10-CM | POA: Diagnosis present

## 2024-09-10 DIAGNOSIS — R45851 Suicidal ideations: Secondary | ICD-10-CM | POA: Diagnosis present

## 2024-09-10 DIAGNOSIS — K219 Gastro-esophageal reflux disease without esophagitis: Secondary | ICD-10-CM | POA: Diagnosis present

## 2024-09-10 DIAGNOSIS — Z91148 Patient's other noncompliance with medication regimen for other reason: Secondary | ICD-10-CM | POA: Insufficient documentation

## 2024-09-10 DIAGNOSIS — Z634 Disappearance and death of family member: Secondary | ICD-10-CM | POA: Insufficient documentation

## 2024-09-10 DIAGNOSIS — Z62810 Personal history of physical and sexual abuse in childhood: Secondary | ICD-10-CM | POA: Diagnosis not present

## 2024-09-10 DIAGNOSIS — Z5941 Food insecurity: Secondary | ICD-10-CM

## 2024-09-10 DIAGNOSIS — F411 Generalized anxiety disorder: Secondary | ICD-10-CM

## 2024-09-10 DIAGNOSIS — F419 Anxiety disorder, unspecified: Secondary | ICD-10-CM | POA: Diagnosis present

## 2024-09-10 LAB — CBC WITH DIFFERENTIAL/PLATELET
Abs Immature Granulocytes: 0.02 K/uL (ref 0.00–0.07)
Basophils Absolute: 0.1 K/uL (ref 0.0–0.1)
Basophils Relative: 1 %
Eosinophils Absolute: 0.2 K/uL (ref 0.0–0.5)
Eosinophils Relative: 2 %
HCT: 48 % (ref 39.0–52.0)
Hemoglobin: 16.2 g/dL (ref 13.0–17.0)
Immature Granulocytes: 0 %
Lymphocytes Relative: 37 %
Lymphs Abs: 3.1 K/uL (ref 0.7–4.0)
MCH: 27.5 pg (ref 26.0–34.0)
MCHC: 33.8 g/dL (ref 30.0–36.0)
MCV: 81.5 fL (ref 80.0–100.0)
Monocytes Absolute: 0.6 K/uL (ref 0.1–1.0)
Monocytes Relative: 8 %
Neutro Abs: 4.3 K/uL (ref 1.7–7.7)
Neutrophils Relative %: 52 %
Platelets: 247 K/uL (ref 150–400)
RBC: 5.89 MIL/uL — ABNORMAL HIGH (ref 4.22–5.81)
RDW: 14.1 % (ref 11.5–15.5)
WBC: 8.3 K/uL (ref 4.0–10.5)
nRBC: 0 % (ref 0.0–0.2)

## 2024-09-10 LAB — COMPREHENSIVE METABOLIC PANEL WITH GFR
ALT: 13 U/L (ref 0–44)
AST: 20 U/L (ref 15–41)
Albumin: 5.2 g/dL — ABNORMAL HIGH (ref 3.5–5.0)
Alkaline Phosphatase: 62 U/L (ref 38–126)
Anion gap: 14 (ref 5–15)
BUN: 13 mg/dL (ref 6–20)
CO2: 26 mmol/L (ref 22–32)
Calcium: 10.1 mg/dL (ref 8.9–10.3)
Chloride: 101 mmol/L (ref 98–111)
Creatinine, Ser: 1.23 mg/dL (ref 0.61–1.24)
GFR, Estimated: >60 mL/min (ref >60)
Glucose, Bld: 87 mg/dL (ref 70–99)
Potassium: 4 mmol/L (ref 3.5–5.1)
Sodium: 141 mmol/L (ref 135–145)
Total Bilirubin: 0.9 mg/dL (ref 0.0–1.2)
Total Protein: 8.2 g/dL — ABNORMAL HIGH (ref 6.5–8.1)

## 2024-09-10 LAB — URINALYSIS, ROUTINE W REFLEX MICROSCOPIC
Bilirubin Urine: NEGATIVE
Glucose, UA: NEGATIVE mg/dL
Hgb urine dipstick: NEGATIVE
Ketones, ur: 5 mg/dL — AB
Leukocytes,Ua: NEGATIVE
Nitrite: NEGATIVE
Protein, ur: NEGATIVE mg/dL
Specific Gravity, Urine: 1.029 (ref 1.005–1.030)
pH: 5 (ref 5.0–8.0)

## 2024-09-10 LAB — POCT URINE DRUG SCREEN - MANUAL ENTRY (I-SCREEN)
POC Amphetamine UR: NOT DETECTED
POC Buprenorphine (BUP): NOT DETECTED
POC Cocaine UR: NOT DETECTED
POC Marijuana UR: POSITIVE — AB
POC Methadone UR: NOT DETECTED
POC Methamphetamine UR: NOT DETECTED
POC Morphine: NOT DETECTED
POC Oxazepam (BZO): NOT DETECTED
POC Oxycodone UR: NOT DETECTED
POC Secobarbital (BAR): NOT DETECTED

## 2024-09-10 LAB — LIPID PANEL
Cholesterol: 290 mg/dL — ABNORMAL HIGH (ref 0–200)
HDL: 61 mg/dL (ref >40)
LDL Cholesterol: 221 mg/dL — ABNORMAL HIGH (ref 0–99)
Total CHOL/HDL Ratio: 4.8 ratio
Triglycerides: 39 mg/dL (ref <150)
VLDL: 8 mg/dL (ref 0–40)

## 2024-09-10 LAB — MAGNESIUM: Magnesium: 2.2 mg/dL (ref 1.7–2.4)

## 2024-09-10 LAB — TSH: TSH: 3.233 u[IU]/mL (ref 0.350–4.500)

## 2024-09-10 LAB — HEMOGLOBIN A1C
Hgb A1c MFr Bld: 5.2 % (ref 4.8–5.6)
Mean Plasma Glucose: 102.54 mg/dL

## 2024-09-10 LAB — ETHANOL: Alcohol, Ethyl (B): <15 mg/dL (ref <15)

## 2024-09-10 MED ORDER — MAGNESIUM HYDROXIDE 400 MG/5ML PO SUSP: 30.0000 mL | Freq: Every day | ORAL | Status: DC | PRN

## 2024-09-10 MED ORDER — DIVALPROEX SODIUM ER 500 MG PO TB24
1000.0000 mg | ORAL_TABLET | Freq: Every day | ORAL | Status: DC
  Administered 2024-09-10 – 2024-09-14 (×5): 1000 mg via ORAL
  Filled 2024-09-10 (×5): qty 2

## 2024-09-10 MED ORDER — HALOPERIDOL 5 MG PO TABS: 5.0000 mg | ORAL_TABLET | Freq: Three times a day (TID) | ORAL | Status: DC | PRN

## 2024-09-10 MED ORDER — TRAZODONE HCL 50 MG PO TABS: 50.0000 mg | ORAL_TABLET | Freq: Every evening | ORAL | Status: DC | PRN

## 2024-09-10 MED ORDER — ACETAMINOPHEN 325 MG PO TABS: 650.0000 mg | ORAL_TABLET | Freq: Four times a day (QID) | ORAL | Status: DC | PRN

## 2024-09-10 MED ORDER — DIPHENHYDRAMINE HCL 50 MG/ML IJ SOLN: 50.0000 mg | Freq: Three times a day (TID) | INTRAMUSCULAR | Status: DC | PRN

## 2024-09-10 MED ORDER — LORAZEPAM 2 MG/ML IJ SOLN: 2.0000 mg | Freq: Three times a day (TID) | INTRAMUSCULAR | Status: DC | PRN

## 2024-09-10 MED ORDER — HALOPERIDOL LACTATE 5 MG/ML IJ SOLN: 10.0000 mg | Freq: Three times a day (TID) | INTRAMUSCULAR | Status: DC | PRN

## 2024-09-10 MED ORDER — ALUM & MAG HYDROXIDE-SIMETH 200-200-20 MG/5ML PO SUSP: 30.0000 mL | ORAL | Status: DC | PRN

## 2024-09-10 MED ORDER — RISPERIDONE 3 MG PO TABS: 3.0000 mg | ORAL_TABLET | Freq: Every day | ORAL | Status: DC

## 2024-09-10 MED ORDER — DIPHENHYDRAMINE HCL 25 MG PO CAPS: 50.0000 mg | ORAL_CAPSULE | Freq: Three times a day (TID) | ORAL | Status: DC | PRN

## 2024-09-10 MED ORDER — HALOPERIDOL LACTATE 5 MG/ML IJ SOLN: 5.0000 mg | Freq: Three times a day (TID) | INTRAMUSCULAR | Status: DC | PRN

## 2024-09-10 MED ORDER — SERTRALINE HCL 25 MG PO TABS: 50.0000 mg | ORAL_TABLET | Freq: Once | ORAL | Status: DC

## 2024-09-10 MED ORDER — HYDROXYZINE HCL 25 MG PO TABS
25.0000 mg | ORAL_TABLET | Freq: Three times a day (TID) | ORAL | Status: DC | PRN
  Filled 2024-09-10: qty 1

## 2024-09-10 MED ORDER — RISPERIDONE 2 MG PO TABS: 2.0000 mg | ORAL_TABLET | Freq: Every day | ORAL | Status: DC

## 2024-09-10 MED ORDER — BUSPIRONE HCL 5 MG PO TABS
10.0000 mg | ORAL_TABLET | Freq: Three times a day (TID) | ORAL | Status: DC
  Administered 2024-09-10 – 2024-09-13 (×10): 10 mg via ORAL
  Filled 2024-09-10 (×10): qty 2

## 2024-09-10 MED ORDER — SERTRALINE HCL 50 MG PO TABS
50.0000 mg | ORAL_TABLET | Freq: Once | ORAL | Status: AC
  Administered 2024-09-10: 50 mg via ORAL
  Filled 2024-09-10: qty 1

## 2024-09-10 MED ORDER — RISPERIDONE 1 MG PO TABS
3.0000 mg | ORAL_TABLET | Freq: Every day | ORAL | Status: DC
  Administered 2024-09-10 – 2024-09-12 (×3): 3 mg via ORAL
  Filled 2024-09-10 (×3): qty 3

## 2024-09-10 MED ORDER — TRAZODONE HCL 50 MG PO TABS
50.0000 mg | ORAL_TABLET | Freq: Every evening | ORAL | Status: DC | PRN
  Filled 2024-09-10 (×2): qty 1

## 2024-09-10 MED ORDER — BENZTROPINE MESYLATE 0.5 MG PO TABS
0.5000 mg | ORAL_TABLET | Freq: Two times a day (BID) | ORAL | Status: DC
  Administered 2024-09-10: 0.5 mg via ORAL
  Filled 2024-09-10: qty 1

## 2024-09-10 MED ORDER — DIPHENHYDRAMINE HCL 50 MG PO CAPS: 50.0000 mg | ORAL_CAPSULE | Freq: Three times a day (TID) | ORAL | Status: DC | PRN

## 2024-09-10 MED ORDER — MAGNESIUM HYDROXIDE 400 MG/5ML PO SUSP
30.0000 mL | Freq: Every day | ORAL | Status: DC | PRN
  Administered 2024-09-15 – 2024-09-17 (×2): 30 mL via ORAL
  Filled 2024-09-10: qty 30

## 2024-09-10 MED ORDER — BENZTROPINE MESYLATE 1 MG PO TABS
0.5000 mg | ORAL_TABLET | Freq: Two times a day (BID) | ORAL | Status: DC
  Administered 2024-09-10 – 2024-09-13 (×6): 0.5 mg via ORAL
  Filled 2024-09-10 (×6): qty 1

## 2024-09-10 MED ORDER — DIVALPROEX SODIUM ER 500 MG PO TB24: 1000.0000 mg | ORAL_TABLET | Freq: Every day | ORAL | Status: DC

## 2024-09-10 MED ADMIN — Buspirone HCl Tab 10 MG: 10 mg | ORAL | NDC 51079098601

## 2024-09-10 MED FILL — Buspirone HCl Tab 10 MG: 10.0000 mg | ORAL | Qty: 1 | Status: AC

## 2024-09-10 NOTE — Progress Notes (Signed)
   09/10/24 1012  Psych Admission Type (Psych Patients Only)  Admission Status Voluntary  Psychosocial Assessment  Patient Complaints Anxiety;Depression;Self-harm thoughts  Eye Contact Fair  Facial Expression Pensive  Affect Depressed  Speech Rapid  Interaction Assertive  Motor Activity Slow  Appearance/Hygiene In scrubs  Behavior Characteristics Cooperative  Mood Depressed;Anxious  Aggressive Behavior  Effect No apparent injury  Thought Process  Coherency WDL  Content WDL  Delusions None reported or observed  Perception WDL  Hallucination None reported or observed  Judgment Impaired  Confusion WDL  Danger to Self  Current suicidal ideation? Passive  Agreement Not to Harm Self Yes  Description of Agreement verbal  Danger to Others  Danger to Others None reported or observed

## 2024-09-10 NOTE — Progress Notes (Signed)
 Patient arrived on the unit voluntarily from [facility] at 1012 am. Patient alert and oriented. Endorses passive SI, HI, AVH and pain at this time. The patient's skin was assessed and was found to have exceptions to the normal daily limits. The patient has tattoos to bilateral forearms, nasal piercing's, scar on left forearm where a metal rod was placed after he broke his arm, and a mole on his right thigh that was biopsied.  No open wounds, cuts, or bruises noted. Routine safety checks conducted every 15 minutes. Patient verbally contracts for safety at this time. Patient rights and responsibilities have been explained and given to the patient to keep in her room. The patient will contact her support system on her own. The patient is now oriented to the unit and the unit rules.
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
## 2024-09-10 NOTE — ED Notes (Signed)
 Pt left with Safe Transport to [facility]

## 2024-09-10 NOTE — Tx Team (Signed)
 Initial Treatment Plan 09/10/2024 10:48 AM Fonda Kung FMW:[id number]    PATIENT STRESSORS: Legal issue   Loss of daughter   Occupational concerns   Traumatic event     PATIENT STRENGTHS: Ability for insight  Average or above average intelligence  Communication skills  Financial means  General fund of knowledge  Motivation for treatment/growth  Physical Health  Supportive family/friends  Work skills    PATIENT IDENTIFIED PROBLEMS: Daughter passed 02/2024  Relationship issues  Loss of appetite  No longer working               DISCHARGE CRITERIA:  Ability to meet basic life and health needs Adequate post-discharge living arrangements Improved stabilization in mood, thinking, and/or behavior Motivation to continue treatment in a less acute level of care Need for constant or close observation no longer present Reduction of life-threatening or endangering symptoms to within safe limits Safe-care adequate arrangements made Verbal commitment to aftercare and medication compliance  PRELIMINARY DISCHARGE PLAN: Attend aftercare/continuing care group Outpatient therapy Return to previous living arrangement Return to previous work or school arrangements  PATIENT/FAMILY INVOLVEMENT: This treatment plan has been presented to and reviewed with the patient, Rodney Thomas.  The patient and family has been given the opportunity to ask questions and make suggestions.  Rodney JONETTA [place], RN 09/10/2024, 10:48 AM

## 2024-09-10 NOTE — ED Provider Notes (Signed)
 [facility] Continuous Assessment Admission H&P  Date: 09/10/24 Patient Name: Rodney Thomas MRN: [id number] Chief Complaint: I keep thinking about killing myself, ever since my daughter died  Diagnoses:  Final diagnoses:  Schizoaffective disorder, depressive type (HCC)  Suicidal ideation    HPI:  Rodney Thomas is a 33 year-old male who presents to [facility] as a voluntary walk-in, accompanied by safe transport with complaint of suicidal ideations without plan/intent. He  reports that he was seen at [facility] for suicidal ideations and has been struggling with ongoing suicidal thoughts  since the loss of his daughter a couple of months ago.  Rodney Thomas  reports diagnosis of schizoaffective disorder. Reports he was  previously taking medications, but stopped due to unwanted side effects. He was recommended to establish with outpatient services  but has not started.  Patient denies self-injurious behaviors. He currently denies HI,AVH and substance/alcohol use. Currently lives with his mother who is supportive.   Chart review indicates that patient visited ED yesterday with the same concerns.  In March 2025, patient was treated at [facility] for paranoid symptoms and hallucinations. Patient was to follow up at [facility] services. He reports that he never followed up and has not been taking any medications.   Patient reports he is now willing to take the medications because his depressive symptoms are escalating, and his mother noticed and recommended him to seek help.   Face-to-face evaluation for Rodney Thomas, a 33 year-old male sitting in the assessment room alone. He appears disheveled and restless. Anxious and depressed. Reports endorsing ongoing  SI related to the death of his daughter. He does admit to paranoia and reports not taking his medications. Rodney Thomas has pressured speech  and appears/sounds to be paranoid. Patient states he has not taken medications since May. Reports he has not been  sleeping or eating. Reports he can't keep a job, has lost 4 jobs in just a few months  my head feels like possessed, I see my daughter sitting right beside me , its taking me backward, nothing is going forward for me but I want to get better. Patient denies HI. Reports his goal for this visit is to get back on his medications and improve his depressive symptoms. Reports he would not feel safe going back home without treatment I want to feel better, I think I have more to live for, I have my mom. Patient denies substance use.   Patient states he is aware that his symptoms have a lot to do with medication non-adherence. He reports not feeling safe going back home tonight. He will be admitted to observation unit. Home medications ordered.  Inpatient recommended.   Total Time spent with patient: 45 minutes  Musculoskeletal  Strength & Muscle Tone: within normal limits Gait & Station: normal Patient leans: N/A  Psychiatric Specialty Exam  Presentation General Appearance:  Bizarre; Disheveled  Eye Contact: Fair  Speech: Clear and Coherent  Speech Volume: Normal  Handedness: Right   Mood and Affect  Mood: Anxious; Depressed  Affect: Depressed   Thought Process  Thought Processes: Coherent  Descriptions of Associations:Intact  Orientation:Full (Time, Place and Person)  Thought Content:Abstract Reasoning  Diagnosis of Schizophrenia or Schizoaffective disorder in past: Yes  Duration of Psychotic Symptoms: Greater than six months  Hallucinations:Hallucinations: None  Ideas of Reference:Paranoia  Suicidal Thoughts:Suicidal Thoughts: Yes, Passive SI Passive Intent and/or Plan: Without Plan  Homicidal Thoughts:Homicidal Thoughts: No   Sensorium  Memory: Immediate Fair; Recent Fair; Remote Fair  Judgment: Fair  Insight: Fair   Chartered certified accountant: Fair  Attention Span: Fair  Recall: [employer] of  Knowledge: Fair  Language: Fair   Psychomotor Activity  Psychomotor Activity: Psychomotor Activity: Normal   Assets  Assets: Manufacturing systems engineer; Desire for Improvement; Physical Health   Sleep  Sleep: Sleep: Poor Number of Hours of Sleep: 2   Nutritional Assessment (For OBS and FBC admissions only) Has the patient had a weight loss or gain of 10 pounds or more in the last 3 months?: No Has the patient had a decrease in food intake/or appetite?: Yes Does the patient have dental problems?: No Does the patient have eating habits or behaviors that may be indicators of an eating disorder including binging or inducing vomiting?: No Has the patient recently lost weight without trying?: 0 Has the patient been eating poorly because of a decreased appetite?: 1 Malnutrition Screening Tool Score: 1    Physical Exam Vitals and nursing note reviewed.  HENT:     Head: Normocephalic.     Right Ear: Tympanic membrane normal.     Left Ear: Tympanic membrane normal.     Nose: Nose normal.     Mouth/Throat:     Mouth: Mucous membranes are moist.  Eyes:     Extraocular Movements: Extraocular movements intact.     Pupils: Pupils are equal, round, and reactive to light.  Cardiovascular:     Rate and Rhythm: Normal rate.     Pulses: Normal pulses.  Pulmonary:     Effort: Pulmonary effort is normal.  Musculoskeletal:        General: Normal range of motion.     Cervical back: Normal range of motion and neck supple.  Neurological:     Mental Status: He is alert and oriented to person, place, and time.    Review of Systems  Constitutional: Negative.   HENT: Negative.    Eyes: Negative.   Respiratory: Negative.    Cardiovascular: Negative.   Genitourinary: Negative.   Musculoskeletal: Negative.   Skin: Negative.   Neurological: Negative.   Endo/Heme/Allergies: Negative.   Psychiatric/Behavioral:  Positive for depression and suicidal ideas. The patient has insomnia.      Blood pressure 126/83, pulse 68, temperature 97.7 F (36.5 C), temperature source Oral, resp. rate 18, SpO2 99%. There is no height or weight on file to calculate BMI.  Past Psychiatric History: Schizoaffective Disorder, depression   Is the patient at risk to self? Yes  Has the patient been a risk to self in the past 6 months? Yes .    Has the patient been a risk to self within the distant past? Yes   Is the patient a risk to others? No   Has the patient been a risk to others in the past 6 months? No   Has the patient been a risk to others within the distant past? No   Past Medical History: NA  Family History: NA  Social History: Patient reports he is currently unemployed. Lives with his mother. He is single  Last Labs:  No visits with results within 6 Month(s) from this visit.  Latest known visit with results is:  Admission on 02/11/2024, Discharged on 02/18/2024  Component Date Value Ref Range Status   Valproic Acid  Lvl 02/11/2024 22 (L)  50.0 - 100.0 ug/mL Final   Performed at [facility], [address] [place], [place] [address]   Valproic Acid  Lvl 02/15/2024 40 (L)  50.0 - 100.0 ug/mL Final  Performed at [facility], [address], [place], [place] [address]    Allergies: Patient has no known allergies.  Medications:  Facility Ordered Medications  Medication   acetaminophen  (TYLENOL ) tablet 650 mg   alum & mag hydroxide-simeth (MAALOX/MYLANTA) 200-200-20 MG/5ML suspension 30 mL   magnesium  hydroxide (MILK OF MAGNESIA) suspension 30 mL   haloperidol  (HALDOL ) tablet 5 mg   And   diphenhydrAMINE  (BENADRYL ) capsule 50 mg   haloperidol  lactate (HALDOL ) injection 5 mg   And   diphenhydrAMINE  (BENADRYL ) injection 50 mg   And   LORazepam  (ATIVAN ) injection 2 mg   haloperidol  lactate (HALDOL ) injection 10 mg   And   diphenhydrAMINE  (BENADRYL ) injection 50 mg   And   LORazepam  (ATIVAN ) injection 2 mg   traZODone  (DESYREL )  tablet 50 mg   divalproex  (DEPAKOTE  ER) 24 hr tablet 1,000 mg   busPIRone  (BUSPAR ) tablet 10 mg   benztropine  (COGENTIN ) tablet 0.5 mg   risperiDONE  (RISPERDAL ) tablet 2 mg   sertraline  (ZOLOFT ) tablet 50 mg   PTA Medications  Medication Sig   polyethylene glycol (MIRALAX  / GLYCOLAX ) 17 g packet Take 17 g by mouth daily as needed for moderate constipation.   magnesium  hydroxide (MILK OF MAGNESIA) 400 MG/5ML suspension Take 5 mLs by mouth daily as needed for mild constipation.   benztropine  (COGENTIN ) 0.5 MG tablet Take 1 tablet (0.5 mg total) by mouth every 12 (twelve) hours.   divalproex  (DEPAKOTE  ER) 500 MG 24 hr tablet Take 2 tablets (1,000 mg total) by mouth at bedtime.   busPIRone  (BUSPAR ) 10 MG tablet Take 1 tablet (10 mg total) by mouth 3 (three) times daily.   hydrOXYzine  (ATARAX ) 25 MG tablet Take 1 tablet (25 mg total) by mouth 3 (three) times daily as needed for anxiety.   sertraline  (ZOLOFT ) 50 MG tablet Take 1 tablet (50 mg total) by mouth daily.   risperiDONE  (RISPERDAL ) 3 MG tablet Take 2 tablets (6 mg total) by mouth at bedtime.      Medical Decision Making  Admission to Observation unit.  Agitation protocol ordered.  EKG Acetaminophen  650 mg PO Q 6 PRN Maalox 30 ml PO Q 4 PRN Milk of Magnesia 30 ml PO Daily PRN   Home medications:  Depakote  100 mg PO HS Buspar  10 mg TID  Benztropine  0.5 mg PO BID Risperidone  3 mg PO HS. Sertraline  50 mg PO Daily  Labs: CBC, CMP, TSH, A1C, UA, UDS, Ethanol, Magnesium , Lipid panel, hepatic function panel    Recommendations  Based on my evaluation the patient does not appear to have an emergency medical condition.  Randall Bouquet, NP 09/10/24  4:05 AM

## 2024-09-10 NOTE — ED Notes (Signed)
 Pt A&O x 4, no distress noted, calm & cooperative, presents with passive SI, no plan noted,  comfort measures given.  Monitoring for safety.

## 2024-09-10 NOTE — Group Note (Signed)
 Date:  09/10/2024 Time:  9:56 PM  Group Topic/Focus:  Wrap-Up Group:   The focus of this group is to help patients review their daily goal of treatment and discuss progress on daily workbooks.    Participation Level:  Active  Participation Quality:  Appropriate  Affect:  Appropriate  Cognitive:  Appropriate  Insight: Appropriate  Engagement in Group:  Engaged  Modes of Intervention:  Discussion  Additional Comments:     Kerri Katz 09/10/2024, 9:56 PM

## 2024-09-10 NOTE — Discharge Instructions (Signed)
 Transfer to [facility]
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
## 2024-09-10 NOTE — ED Provider Notes (Signed)
 [place] EMERGENCY DEPARTMENT AT [facility] Provider Note  CSN: [id number] Arrival date & time: 09/09/24 2305  Chief Complaint(s) Psychiatric Evaluation  HPI Rodney Thomas is a 33 y.o. male with a past medical history listed below including schizoaffective disorder and bipolar type here for passive SI since April.  Patient reports that it was triggered by the loss of his daughter.  He does not have any active plans currently but would like to get the suicidal ideations out of his head.  He denies any homicidal ideations.  Denies any AVH.  Reports that he was previously on medication but stopped because of the side effects.  Denies any other physical complaints.  Denies any illicit drug use or alcohol use.  HPI  Past Medical History Past Medical History:  Diagnosis Date   Chronic insomnia 01/16/2024   Obsessive-compulsive behavior 01/16/2024   Schizoaffective disorder, bipolar type (HCC) 01/16/2024   Seasonal allergies    Vitamin D  deficiency 01/17/2024   Patient Active Problem List   Diagnosis Date Noted   Suicidal ideations 02/10/2024   Acute paranoia (HCC) 02/09/2024   Vitamin D  deficiency 01/17/2024   Schizoaffective disorder, bipolar type (HCC) 01/16/2024   Chronic insomnia 01/16/2024   Obsessive-compulsive behavior 01/16/2024   Paranoid disorder (HCC) 01/15/2024   Anxiety state 01/14/2024   Home Medication(s) Prior to Admission medications   Medication Sig Start Date End Date Taking? Authorizing Provider  benztropine  (COGENTIN ) 0.5 MG tablet Take 1 tablet (0.5 mg total) by mouth every 12 (twelve) hours. 03/11/24 03/11/25  Nwoko, Uchenna E, PA  busPIRone  (BUSPAR ) 10 MG tablet Take 1 tablet (10 mg total) by mouth 3 (three) times daily. 03/11/24 03/11/25  Nwoko, Uchenna E, PA  divalproex  (DEPAKOTE  ER) 500 MG 24 hr tablet Take 2 tablets (1,000 mg total) by mouth at bedtime. 03/11/24 03/11/25  Nwoko, Uchenna E, PA  hydrOXYzine  (ATARAX ) 25 MG tablet Take 1 tablet (25  mg total) by mouth 3 (three) times daily as needed for anxiety. 03/11/24 03/11/25  Nwoko, Uchenna E, PA  magnesium  hydroxide (MILK OF MAGNESIA) 400 MG/5ML suspension Take 5 mLs by mouth daily as needed for mild constipation. 03/11/24   Nwoko, Uchenna E, PA  polyethylene glycol (MIRALAX  / GLYCOLAX ) 17 g packet Take 17 g by mouth daily as needed for moderate constipation. 03/11/24   Nwoko, Uchenna E, PA  risperiDONE  (RISPERDAL ) 3 MG tablet Take 2 tablets (6 mg total) by mouth at bedtime. 03/11/24 03/11/25  Nwoko, Uchenna E, PA  sertraline  (ZOLOFT ) 50 MG tablet Take 1 tablet (50 mg total) by mouth daily. 03/11/24 03/11/25  Nwoko, Uchenna E, PA                                                                                                                                    Allergies Patient has no known allergies.  Review of Systems Review of Systems As noted in HPI  Physical Exam Vital  Signs  I have reviewed the triage vital signs BP 110/78   Pulse 72   Temp 98.6 F (37 C) (Oral)   Resp 18   Ht 5' 10 (1.778 m)   Wt 80 kg   SpO2 99%   BMI 25.31 kg/m   Physical Exam Vitals reviewed.  Constitutional:      General: He is not in acute distress.    Appearance: He is well-developed. He is not diaphoretic.  HENT:     Head: Normocephalic and atraumatic.     Right Ear: External ear normal.     Left Ear: External ear normal.     Nose: Nose normal.     Mouth/Throat:     Mouth: Mucous membranes are moist.  Eyes:     General: No scleral icterus.    Conjunctiva/sclera: Conjunctivae normal.  Neck:     Trachea: Phonation normal.  Cardiovascular:     Rate and Rhythm: Normal rate and regular rhythm.  Pulmonary:     Effort: Pulmonary effort is normal. No respiratory distress.     Breath sounds: No stridor.  Abdominal:     General: There is no distension.  Musculoskeletal:        General: Normal range of motion.     Cervical back: Normal range of motion.  Neurological:     Mental Status: He  is alert and oriented to person, place, and time.  Psychiatric:        Attention and Perception: Attention normal.        Mood and Affect: Mood normal.        Speech: Speech normal.        Behavior: Behavior normal.        Thought Content: Thought content is not paranoid or delusional. Thought content includes suicidal ideation. Thought content does not include homicidal ideation. Thought content does not include homicidal or suicidal plan.     ED Results and Treatments Labs (all labs ordered are listed, but only abnormal results are displayed) Labs Reviewed - No data to display                                                                                                                       EKG  EKG Interpretation Date/Time:    Ventricular Rate:    PR Interval:    QRS Duration:    QT Interval:    QTC Calculation:   R Axis:      Text Interpretation:         Radiology No results found.  Medications Ordered in ED Medications - No data to display Procedures Procedures  (including critical care time) Medical Decision Making / ED Course   Medical Decision Making   Passive SI with no active plans.  No other complaints. No need for inpatient psychiatric evaluation. No need for labs.  Patient is medically cleared for behavioral health evaluation.  I spoke with Roxianne Olp, NP at behavioral health urgent care who accepted patient.  Final Clinical Impression(s) / ED Diagnoses Final diagnoses:  Suicidal ideation    This chart was dictated using voice recognition software.  Despite best efforts to proofread,  errors can occur which can change the documentation meaning.    Trine Raynell Moder, MD 09/10/24 0130

## 2024-09-10 NOTE — ED Notes (Signed)
[facility] called for transport to [facility] 

## 2024-09-10 NOTE — ED Notes (Signed)
 Attempted to call report to [facility].  Was informed that the nurses are giving medications.  Will attempt to call back shortly.  Pt is calm and sitting up in bed. Aware of pending transfer.

## 2024-09-10 NOTE — Progress Notes (Signed)
 Pt is awake and alert with verbal prompting.  Pt was informed that a bed was availabe for him at [facility]. Pt refused to sign vol consent that my probation officer will not let me go out of the county.  he refused to sign Vol consent.

## 2024-09-10 NOTE — ED Notes (Signed)
 [facility] called and notified of pt coming to their facility per Roxianne Olp, NP that accepted pt. Staff stated they were aware of him coming and to call [facility] for his transfer

## 2024-09-10 NOTE — ED Notes (Signed)
 Report called to [employer] .  Verbalized understanding.  Safe transport called at this time.

## 2024-09-10 NOTE — ED Triage Notes (Signed)
 Pt to ED from home with c/o suicidal ideation without a definitive plan. Pt states that he lost his daughter back in march and has been having these thought since then. Pt is speaking in a fast rambling fashion with a flight of ideas. VSS, NADN. Denies any drug or alcohol usage.

## 2024-09-10 NOTE — Plan of Care (Signed)
  Problem: Education: Goal: Verbalization of understanding the information provided will improve Outcome: Progressing   Problem: Activity: Goal: Sleeping patterns will improve Outcome: Progressing   Problem: Coping: Goal: Ability to verbalize frustrations and anger appropriately will improve Outcome: Progressing Goal: Ability to demonstrate self-control will improve Outcome: Progressing   Problem: Health Behavior/Discharge Planning: Goal: Compliance with treatment plan for underlying cause of condition will improve Outcome: Progressing   Problem: Education: Goal: Emotional status will improve Outcome: Not Progressing Goal: Mental status will improve Outcome: Not Progressing Note: Passive SI   Problem: Activity: Goal: Interest or engagement in activities will improve Outcome: Not Progressing Note: Patient has social anxiety. Isolative to room. Encouraged to participate in groups and hang out in the milieu

## 2024-09-10 NOTE — [facility] Suicide Risk Assessment (Signed)
 [facility] INPATIENT:  Family/Significant Other Suicide Prevention Education  Suicide Prevention Education:  Patient Refusal for Family/Significant Other Suicide Prevention Education: The patient Rodney Thomas has refused to provide written consent for family/significant other to be provided Family/Significant Other Suicide Prevention Education during admission and/or prior to discharge.  Physician notified.  [name] 09/10/2024, 11:35 AM

## 2024-09-10 NOTE — Progress Notes (Signed)
 Pt given back all belongings from locker 26 along with clean scrubs. He was escorted to [facility] and left with safe transport without incident.

## 2024-09-10 NOTE — ED Provider Notes (Signed)
 FBC/OBS ASAP Discharge Summary  Date and Time: 09/10/2024 9:01 AM  Name: Rodney Thomas  MRN:  [id number]   Discharge Diagnoses:  Final diagnoses:  Schizoaffective disorder, depressive type (HCC)  Suicidal ideation    Subjective: Patient seen and evaluated face-to-face by this provider, chart reviewed and case discussed with morning treatment team. On evaluation, patient is alert and oriented x 4. His thought process is linear. Thought content is positive for passive SI and negative for HI/AVH. Objectively, no signs of acute psychosis. His mood is depressed and he endorses depressive symptoms of sadness, crying episodes, isolating, hopelessness, anhedonia, decreased energy, decreased motivation, and easily annoyed since April when his 23-year-old daughter died in a car accident. He reports fair sleep. He reports a decreased appetite. He denies drinking alcohol or using illicit drugs. He denies medication side effects. He denies physical complaints.  Stay Summary: Rodney Thomas is a 33 year-old male who presents to [facility] as a voluntary walk-in, accompanied by safe transport with complaint of suicidal ideations without plan/intent. He  reports that he was seen at [facility] for suicidal ideations and has been struggling with ongoing suicidal thoughts  since the loss of his daughter a couple of months ago. Rodney Thomas reports diagnosis of schizoaffective disorder. Reports he was  previously taking medications, but stopped due to unwanted side effects. He was recommended to establish with outpatient services  but has not started. Patient denies self-injurious behaviors. He currently denies HI,AVH and substance/alcohol use. Currently lives with his mother who is supportive.    Chart review indicates that patient visited ED yesterday with the same concerns. In March 2025, patient was treated at [facility] for paranoid symptoms and hallucinations. Patient was to follow up at [facility] services. He reports that he never  followed up and has not been taking any medications.    Total Time spent with patient: 30 minutes  Past Psychiatric History: History of paranoid disorder, schizoaffective disorder, bipolar type and OCD. Hospitalized at [facility] from February 11, 2024 to February 18, 2024. Patient denies a history of suicide attempts.  Past Medical History: A documented history of vitamin D  deficiency.  Family Psychiatric History: No reported history.  Social History: Patient resides with his mother and 38 year old sister. He is unemployed. He is currently on probation. He denies using illicit drugs or drinking alcohol.  Tobacco Cessation:  N/A, patient does not currently use tobacco products  Current Medications:  Current Facility-Administered Medications  Medication Dose Route Frequency Provider Last Rate Last Admin   acetaminophen  (TYLENOL ) tablet 650 mg  650 mg Oral Q6H PRN Randall Starlyn HERO, NP       alum & mag hydroxide-simeth (MAALOX/MYLANTA) 200-200-20 MG/5ML suspension 30 mL  30 mL Oral Q4H PRN Randall, Veronique M, NP       benztropine  (COGENTIN ) tablet 0.5 mg  0.5 mg Oral BID Byungura, Veronique M, NP       busPIRone  (BUSPAR ) tablet 10 mg  10 mg Oral TID Byungura, Veronique M, NP       haloperidol  (HALDOL ) tablet 5 mg  5 mg Oral TID PRN Randall Starlyn HERO, NP       And   diphenhydrAMINE  (BENADRYL ) capsule 50 mg  50 mg Oral TID PRN Randall Starlyn HERO, NP       haloperidol  lactate (HALDOL ) injection 5 mg  5 mg Intramuscular TID PRN Randall Starlyn HERO, NP       And   diphenhydrAMINE  (BENADRYL ) injection 50 mg  50 mg Intramuscular TID PRN  Randall Starlyn HERO, NP       And   LORazepam  (ATIVAN ) injection 2 mg  2 mg Intramuscular TID PRN Randall Starlyn HERO, NP       haloperidol  lactate (HALDOL ) injection 10 mg  10 mg Intramuscular TID PRN Randall Starlyn HERO, NP       And   diphenhydrAMINE  (BENADRYL ) injection 50 mg  50 mg Intramuscular TID PRN Randall Starlyn HERO, NP       And   LORazepam  (ATIVAN ) injection 2 mg  2 mg Intramuscular TID PRN Byungura, Veronique M, NP       divalproex  (DEPAKOTE  ER) 24 hr tablet 1,000 mg  1,000 mg Oral QHS Randall, Veronique M, NP       magnesium  hydroxide (MILK OF MAGNESIA) suspension 30 mL  30 mL Oral Daily PRN Randall Starlyn HERO, NP       risperiDONE  (RISPERDAL ) tablet 3 mg  3 mg Oral QHS Byungura, Veronique M, NP       sertraline  (ZOLOFT ) tablet 50 mg  50 mg Oral Once Byungura, Veronique M, NP       traZODone  (DESYREL ) tablet 50 mg  50 mg Oral QHS PRN Randall Starlyn HERO, NP       Current Outpatient Medications  Medication Sig Dispense Refill   benztropine  (COGENTIN ) 0.5 MG tablet Take 1 tablet (0.5 mg total) by mouth every 12 (twelve) hours. 60 tablet 1   busPIRone  (BUSPAR ) 10 MG tablet Take 1 tablet (10 mg total) by mouth 3 (three) times daily. 90 tablet 1   divalproex  (DEPAKOTE  ER) 500 MG 24 hr tablet Take 2 tablets (1,000 mg total) by mouth at bedtime. 60 tablet 1   hydrOXYzine  (ATARAX ) 25 MG tablet Take 1 tablet (25 mg total) by mouth 3 (three) times daily as needed for anxiety. 75 tablet 1   risperiDONE  (RISPERDAL ) 3 MG tablet Take 1 tablet (3 mg total) by mouth at bedtime.     sertraline  (ZOLOFT ) 50 MG tablet Take 1 tablet (50 mg total) by mouth daily. 30 tablet 1    PTA Medications:  Facility Ordered Medications  Medication   acetaminophen  (TYLENOL ) tablet 650 mg   alum & mag hydroxide-simeth (MAALOX/MYLANTA) 200-200-20 MG/5ML suspension 30 mL   magnesium  hydroxide (MILK OF MAGNESIA) suspension 30 mL   haloperidol  (HALDOL ) tablet 5 mg   And   diphenhydrAMINE  (BENADRYL ) capsule 50 mg   haloperidol  lactate (HALDOL ) injection 5 mg   And   diphenhydrAMINE  (BENADRYL ) injection 50 mg   And   LORazepam  (ATIVAN ) injection 2 mg   haloperidol  lactate (HALDOL ) injection 10 mg   And   diphenhydrAMINE  (BENADRYL ) injection 50 mg   And   LORazepam  (ATIVAN ) injection 2 mg   traZODone  (DESYREL )  tablet 50 mg   divalproex  (DEPAKOTE  ER) 24 hr tablet 1,000 mg   busPIRone  (BUSPAR ) tablet 10 mg   benztropine  (COGENTIN ) tablet 0.5 mg   sertraline  (ZOLOFT ) tablet 50 mg   risperiDONE  (RISPERDAL ) tablet 3 mg   PTA Medications  Medication Sig   benztropine  (COGENTIN ) 0.5 MG tablet Take 1 tablet (0.5 mg total) by mouth every 12 (twelve) hours.   divalproex  (DEPAKOTE  ER) 500 MG 24 hr tablet Take 2 tablets (1,000 mg total) by mouth at bedtime.   busPIRone  (BUSPAR ) 10 MG tablet Take 1 tablet (10 mg total) by mouth 3 (three) times daily.   hydrOXYzine  (ATARAX ) 25 MG tablet Take 1 tablet (25 mg total) by mouth 3 (three) times daily as needed for anxiety.  sertraline  (ZOLOFT ) 50 MG tablet Take 1 tablet (50 mg total) by mouth daily.   risperiDONE  (RISPERDAL ) 3 MG tablet Take 1 tablet (3 mg total) by mouth at bedtime.       09/10/2024    4:04 AM 03/11/2024   11:34 AM 02/11/2024   11:55 AM  Depression screen PHQ 2/9  Decreased Interest 2 1 1   Down, Depressed, Hopeless 2 1 2   PHQ - 2 Score 4 2 3   Altered sleeping 2 2 1   Tired, decreased energy 0 0 1  Change in appetite 1 1 1   Feeling bad or failure about yourself  2 3 0  Trouble concentrating 2 0 1  Moving slowly or fidgety/restless 0 0 1  Suicidal thoughts 2 1 0  PHQ-9 Score 13 9 8   Difficult doing work/chores Very difficult Somewhat difficult Somewhat difficult    Flowsheet Row ED from 09/10/2024 in [facility] ED from 09/09/2024 in [facility] Emergency Department at [facility] Office Visit from 03/11/2024 in [facility]  C-SSRS RISK CATEGORY Low Risk No Risk Moderate Risk    Musculoskeletal  Strength & Muscle Tone: within normal limits Gait & Station: normal Patient leans: N/A  Psychiatric Specialty Exam  Presentation  General Appearance:  Appropriate for Environment  Eye Contact: Fair  Speech: Clear and Coherent  Speech  Volume: Normal  Handedness: Right   Mood and Affect  Mood: Depressed  Affect: Congruent   Thought Process  Thought Processes: Coherent  Descriptions of Associations:Intact  Orientation:Full (Time, Place and Person)  Thought Content:Logical  Diagnosis of Schizophrenia or Schizoaffective disorder in past: No  Duration of Psychotic Symptoms: Greater than six months   Hallucinations:Hallucinations: None  Ideas of Reference:None  Suicidal Thoughts:Suicidal Thoughts: Yes, Passive SI Passive Intent and/or Plan: Without Intent; Without Plan  Homicidal Thoughts:Homicidal Thoughts: No   Sensorium  Memory: Immediate Fair; Recent Fair; Remote Fair  Judgment: Fair  Insight: Fair   Chartered certified accountant: Fair  Attention Span: Fair  Recall: [employer] of Knowledge: Fair  Language: Fair   Psychomotor Activity  Psychomotor Activity: Psychomotor Activity: Normal   Assets  Assets: Communication Skills; Desire for Improvement   Sleep  Sleep: Sleep: Fair  Estimated Sleeping Duration (Last 24 Hours): 0.00 hours  Nutritional Assessment (For OBS and FBC admissions only) Has the patient had a weight loss or gain of 10 pounds or more in the last 3 months?: No Has the patient had a decrease in food intake/or appetite?: Yes Does the patient have dental problems?: No Does the patient have eating habits or behaviors that may be indicators of an eating disorder including binging or inducing vomiting?: No Has the patient recently lost weight without trying?: 0 Has the patient been eating poorly because of a decreased appetite?: 1 Malnutrition Screening Tool Score: 1    Physical Exam  Physical Exam Cardiovascular:     Rate and Rhythm: Normal rate.  Pulmonary:     Effort: Pulmonary effort is normal.  Musculoskeletal:        General: Normal range of motion.     Cervical back: Normal range of motion.  Neurological:     Mental Status: He  is alert and oriented to person, place, and time.    Review of Systems  Respiratory: Negative.    Cardiovascular: Negative.   Gastrointestinal: Negative.   Genitourinary: Negative.   Musculoskeletal: Negative.   Neurological: Negative.   Psychiatric/Behavioral:  Positive for depression and  suicidal ideas.    Blood pressure 107/71, pulse 70, temperature 98.1 F (36.7 C), temperature source Oral, resp. rate 17, SpO2 99%. There is no height or weight on file to calculate BMI.   Plan Of Care/Follow-up recommendations:  Continue Risperdal  3 mg nightly for schizoaffective disorder, depressive type Continue benztropine  0.5 mg p.o twice daily for EPS Continue Zoloft  50 mg p.o. daily for depression Continue Depakote  1000 mg p.o. nightly for mood stabilization  Disposition: Patient recommended for inpatient psychiatric treatment for mood stabilization, safety and medication management for complaints of worsening depression and suicidal ideations. Patient accepted to [facility] today. Admission orders placed for [facility].   Aeva Posey L, NP 09/10/2024, 9:01 AM

## 2024-09-11 DIAGNOSIS — F251 Schizoaffective disorder, depressive type: Secondary | ICD-10-CM

## 2024-09-11 LAB — URINE DRUG SCREEN, QUALITATIVE (ARMC ONLY)
Amphetamines, Ur Screen: NOT DETECTED
Barbiturates, Ur Screen: NOT DETECTED
Benzodiazepine, Ur Scrn: NOT DETECTED
Cannabinoid 50 Ng, Ur ~~LOC~~: NOT DETECTED
Cocaine Metabolite,Ur ~~LOC~~: NOT DETECTED
MDMA (Ecstasy)Ur Screen: NOT DETECTED
Methadone Scn, Ur: NOT DETECTED
Opiate, Ur Screen: NOT DETECTED
Phencyclidine (PCP) Ur S: NOT DETECTED
Tricyclic, Ur Screen: NOT DETECTED

## 2024-09-11 NOTE — Plan of Care (Signed)
   Problem: Physical Regulation: Goal: Ability to maintain clinical measurements within normal limits will improve Outcome: Progressing   Problem: Safety: Goal: Periods of time without injury will increase Outcome: Progressing
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
## 2024-09-11 NOTE — Group Note (Unsigned)
 Date:  09/11/2024 Time:  7:17 PM  Group Topic/Focus:  Conflict Resolution:   The focus of this group is to discuss the conflict resolution process and how it may be used upon discharge.    Participation Level:  {BHH PARTICIPATION OZCZO:77735}  Participation Quality:  {BHH PARTICIPATION QUALITY:22265}  Affect:  {BHH AFFECT:22266}  Cognitive:  {BHH COGNITIVE:22267}  Insight: {BHH Insight2:20797}  Engagement in Group:  {BHH ENGAGEMENT IN HMNLE:77731}  Modes of Intervention:  {BHH MODES OF INTERVENTION:22269}  Additional Comments:  ***  Rodney Thomas 09/11/2024, 7:17 PM

## 2024-09-11 NOTE — Plan of Care (Signed)

## 2024-09-11 NOTE — Progress Notes (Addendum)
   09/11/24 2300  Psych Admission Type (Psych Patients Only)  Admission Status Voluntary  Psychosocial Assessment  Patient Complaints Anxiety;Depression;  Eye Contact Fair  Facial Expression Pensive  Affect Depressed  Speech Rapid  Interaction Assertive  Motor Activity Slow  Appearance/Hygiene In scrubs  Behavior Characteristics Cooperative  Mood Depressed;Anxious  Aggressive Behavior  Effect No apparent injury  Thought Process  Coherency WDL  Content WDL  Delusions None reported or observed  Perception WDL  Hallucination None reported or observed  Judgment Impaired  Confusion WDL  Danger to Self  Current suicidal ideation? Passive  Agreement Not to Harm Self Yes  Description of Agreement verbal  Danger to Others  Danger to Others None reported or observed
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
## 2024-09-12 DIAGNOSIS — F251 Schizoaffective disorder, depressive type: Secondary | ICD-10-CM | POA: Diagnosis not present

## 2024-09-12 NOTE — Group Note (Signed)
 Recreation Therapy Group Note   Group Topic:General Recreation  Group Date: 09/12/2024 Start Time: 1055 End Time: 1135 Facilitators: Celestia Jeoffrey BRAVO, LRT, CTRS Location: Courtyard  Group Description: [employer]. LRT and patients played games of basketball, drew with chalk, and played corn hole while outside in the courtyard while getting fresh air and sunlight. Music was being played in the background. LRT and peers conversed about different games they have played before, what they do in their free time and anything else that is on their minds. LRT encouraged pts to drink water after being outside, sweating and getting their heart rate up.  Goal Area(s) Addressed: Patient will build on frustration tolerance skills. Patients will partake in a competitive play game with peers. Patients will gain knowledge of new leisure interest/hobby.    Affect/Mood: Appropriate   Participation Level: Active and Engaged   Participation Quality: Independent   Behavior: Calm and Cooperative   Speech/Thought Process: Coherent   Insight: Good   Judgement: Good   Modes of Intervention: Exploration and Socialization   Patient Response to Interventions:  Attentive, Engaged, and Receptive   Education Outcome:  Acknowledges education   Clinical Observations/Individualized Feedback: Rodney Thomas was active in their participation of session activities and group discussion. Pt interacted well with LRT and peers duration of session.    Plan: Continue to engage patient in RT group sessions 2-3x/week.   Jeoffrey BRAVO Celestia, LRT, CTRS 09/12/2024 1:10 PM

## 2024-09-12 NOTE — Progress Notes (Signed)
 Pt calm and pleasant during assessment denying SI/HI/AVH. Pt observed by this Clinical research associate interacting appropriately with staff and peers on the unit. Pt compliant with medication administration per MD orders. Pt given education, support, and encouragement to be active in his treatment plan. Pt being monitored Q 15 minutes for safety per unit protocol, remains safe on the unit

## 2024-09-12 NOTE — Plan of Care (Signed)
  Problem: Education: Goal: Knowledge of [facility] Education information/materials will improve Outcome: Progressing Goal: Emotional status will improve Outcome: Progressing Goal: Mental status will improve Outcome: Progressing   Problem: Activity: Goal: Sleeping patterns will improve Outcome: Progressing

## 2024-09-12 NOTE — Progress Notes (Signed)
 [facility] MD Progress Note  09/12/2024 8:24 PM Rodney Thomas  MRN:  [id number]   Subjective:  Chart reviewed, case discussed in multidisciplinary meeting, patient seen during rounds.   On interview today, patient is noted to be alert and oriented.  He displays paranoia, stating that he feels like someone is trying to get him or put poison in his food.  He states he feels safe on the unit overall but states he feels the need to be cautious.  He reports passive SI without intent or plan.  He denies HI/plan and denies hallucinations.  He has been compliant with medications.  He reports tolerating current medication regimen well without adverse effects.    Sleep: Fair  Appetite:  Fair  Past Psychiatric History: see h&P Family History: History reviewed. No pertinent family history. Social History:  Social History   Substance and Sexual Activity  Alcohol Use No     Social History   Substance and Sexual Activity  Drug Use No    Social History   Socioeconomic History   Marital status: Single    Spouse name: Not on file   Number of children: Not on file   Years of education: Not on file   Highest education level: Not on file  Occupational History   Not on file  Tobacco Use   Smoking status: Never   Smokeless tobacco: Never  Vaping Use   Vaping status: Never Used  Substance and Sexual Activity   Alcohol use: No   Drug use: No   Sexual activity: Not Currently  Other Topics Concern   Not on file  Social History Narrative   Not on file   Social Drivers of Health   Financial Resource Strain: Not on file  Food Insecurity: Food Insecurity Present (09/10/2024)   Hunger Vital Sign    Worried About Running Out of Food in the Last Year: Sometimes true    Ran Out of Food in the Last Year: Sometimes true  Transportation Needs: No Transportation Needs (09/10/2024)   PRAPARE - Administrator, Civil Service (Medical): No    Lack of Transportation (Non-Medical): No   Physical Activity: Not on file  Stress: Not on file  Social Connections: Not on file   Past Medical History:  Past Medical History:  Diagnosis Date   Chronic insomnia 01/16/2024   Obsessive-compulsive behavior 01/16/2024   Schizoaffective disorder, bipolar type (HCC) 01/16/2024   Seasonal allergies    Vitamin D  deficiency 01/17/2024    Past Surgical History:  Procedure Laterality Date   APPENDECTOMY     ORIF RADIAL FRACTURE Left 03/17/2014   Procedure: OPEN REDUCTION INTERNAL FIXATION (ORIF) RADIAL FRACTURE;  Surgeon: Oneil JAYSON Herald, MD;  Location: [facility] OR;  Service: Orthopedics;  Laterality: Left;  Open Reduction Internal Fixation Left Radial Shaft Fracture    Current Medications: Current Facility-Administered Medications  Medication Dose Route Frequency Provider Last Rate Last Admin   acetaminophen  (TYLENOL ) tablet 650 mg  650 mg Oral Q6H PRN White, Patrice L, NP       alum & mag hydroxide-simeth (MAALOX/MYLANTA) 200-200-20 MG/5ML suspension 30 mL  30 mL Oral Q4H PRN White, Patrice L, NP       benztropine  (COGENTIN ) tablet 0.5 mg  0.5 mg Oral BID White, Patrice L, NP   0.5 mg at 09/12/24 1800   busPIRone  (BUSPAR ) tablet 10 mg  10 mg Oral TID White, Patrice L, NP   10 mg at 09/12/24 1800   haloperidol  (HALDOL ) tablet 5  mg  5 mg Oral TID PRN White, Patrice L, NP       And   diphenhydrAMINE  (BENADRYL ) capsule 50 mg  50 mg Oral TID PRN White, Patrice L, NP       haloperidol  lactate (HALDOL ) injection 5 mg  5 mg Intramuscular TID PRN White, Patrice L, NP       And   diphenhydrAMINE  (BENADRYL ) injection 50 mg  50 mg Intramuscular TID PRN White, Patrice L, NP       And   LORazepam  (ATIVAN ) injection 2 mg  2 mg Intramuscular TID PRN White, Patrice L, NP       haloperidol  lactate (HALDOL ) injection 10 mg  10 mg Intramuscular TID PRN White, Patrice L, NP       And   diphenhydrAMINE  (BENADRYL ) injection 50 mg  50 mg Intramuscular TID PRN White, Patrice L, NP       And   LORazepam  (ATIVAN )  injection 2 mg  2 mg Intramuscular TID PRN White, Patrice L, NP       divalproex  (DEPAKOTE  ER) 24 hr tablet 1,000 mg  1,000 mg Oral QHS White, Patrice L, NP   1,000 mg at 09/11/24 2131   hydrOXYzine  (ATARAX ) tablet 25 mg  25 mg Oral TID PRN White, Patrice L, NP       magnesium  hydroxide (MILK OF MAGNESIA) suspension 30 mL  30 mL Oral Daily PRN White, Patrice L, NP       risperiDONE  (RISPERDAL ) tablet 3 mg  3 mg Oral QHS White, Patrice L, NP   3 mg at 09/11/24 2131   sertraline  (ZOLOFT ) tablet 50 mg  50 mg Oral Once White, Patrice L, NP       traZODone  (DESYREL ) tablet 50 mg  50 mg Oral QHS PRN White, Patrice L, NP        Lab Results:  Results for orders placed or performed during the hospital encounter of 09/10/24 (from the past 48 hours)  Urine Drug Screen, Qualitative ([facility] only)     Status: None   Collection Time: 09/11/24  8:50 AM  Result Value Ref Range   Tricyclic, Ur Screen NONE DETECTED NONE DETECTED   Amphetamines, Ur Screen NONE DETECTED NONE DETECTED   MDMA (Ecstasy)Ur Screen NONE DETECTED NONE DETECTED   Cocaine Metabolite,Ur [place] NONE DETECTED NONE DETECTED   Opiate, Ur Screen NONE DETECTED NONE DETECTED   Phencyclidine (PCP) Ur S NONE DETECTED NONE DETECTED   Cannabinoid 50 Ng, Ur [place] NONE DETECTED NONE DETECTED   Barbiturates, Ur Screen NONE DETECTED NONE DETECTED   Benzodiazepine, Ur Scrn NONE DETECTED NONE DETECTED   Methadone Scn, Ur NONE DETECTED NONE DETECTED    Comment: (NOTE) Tricyclics + metabolites, urine    Cutoff 1000 ng/mL Amphetamines + metabolites, urine  Cutoff 1000 ng/mL MDMA (Ecstasy), urine              Cutoff 500 ng/mL Cocaine Metabolite, urine          Cutoff 300 ng/mL Opiate + metabolites, urine        Cutoff 300 ng/mL Phencyclidine (PCP), urine         Cutoff 25 ng/mL Cannabinoid, urine                 Cutoff 50 ng/mL Barbiturates + metabolites, urine  Cutoff 200 ng/mL Benzodiazepine, urine              Cutoff 200 ng/mL Methadone, urine  Cutoff 300 ng/mL  The urine drug screen provides only a preliminary, unconfirmed analytical test result and should not be used for non-medical purposes. Clinical consideration and professional judgment should be applied to any positive drug screen result due to possible interfering substances. A more specific alternate chemical method must be used in order to obtain a confirmed analytical result. Gas chromatography / mass spectrometry (GC/MS) is the preferred confirm atory method. Performed at [facility], [address] [place], [place] [address]     Blood Alcohol level:  Lab Results  Component Value Date   [facility] <15 09/10/2024   ETH <10 02/08/2024    Metabolic Disorder Labs: Lab Results  Component Value Date   HGBA1C 5.2 09/10/2024   MPG 102.54 09/10/2024   MPG 108.28 01/16/2024   No results found for: PROLACTIN Lab Results  Component Value Date   CHOL 290 (H) 09/10/2024   TRIG 39 09/10/2024   HDL 61 09/10/2024   CHOLHDL 4.8 09/10/2024   VLDL 8 09/10/2024   LDLCALC 221 (H) 09/10/2024   LDLCALC 72 01/16/2024    Physical Findings: AIMS:  , ,  ,  ,    CIWA:    COWS:      Psychiatric Specialty Exam:  Presentation  General Appearance:  Appropriate for Environment  Eye Contact: Fair  Speech: Clear and Coherent  Speech Volume: Normal    Mood and Affect  Mood: Depressed  Affect: Congruent   Thought Process  Thought Processes: Coherent  Descriptions of Associations:Intact  Orientation:Full (Time, Place and Person)  Thought Content: Paranoia  Hallucinations: None Ideas of Reference:None  Suicidal Thoughts: No Homicidal Thoughts: No Sensorium  Memory: Immediate Fair; Recent Fair; Remote Fair  Judgment: Fair  Insight: Fair   Art therapist  Concentration: Fair  Attention Span: Fair  Recall: [employer] of Knowledge: Fair  Language: Fair   Psychomotor Activity  Psychomotor Activity:No data  recorded Musculoskeletal: Strength & Muscle Tone: within normal limits Gait & Station: normal Assets  Assets: Manufacturing systems engineer; Desire for Improvement    Physical Exam: Physical Exam ROS Blood pressure 108/65, pulse (!) 55, temperature 97.7 F (36.5 C), temperature source Oral, resp. rate 20, height 5' 10 (1.778 m), weight 72.6 kg, SpO2 100%. Body mass index is 22.96 kg/m.  Diagnosis: Principal Problem:   Schizoaffective disorder, depressive type (HCC)   PLAN: Safety and Monitoring:  -- Voluntary admission to inpatient psychiatric unit for safety, stabilization and treatment  -- Daily contact with patient to assess and evaluate symptoms and progress in treatment  -- Patient's case to be discussed in multi-disciplinary team meeting  -- Observation Level : q15 minute checks  -- Vital signs:  q12 hours  -- Precautions: suicide, elopement, and assault -- Encouraged patient to participate in unit milieu and in scheduled group therapies   2. Psychiatric Diagnoses and Treatment:        Schizoaffective Disorder: Continue home medications and monitor for effectiveness         -- The risks/benefits/side-effects/alternatives to this medication were discussed in detail with the patient and time was given for questions. The patient consents to medication trial.                -- Metabolic profile and EKG monitoring obtained while on an atypical antipsychotic (BMI: Lipid Panel: HbgA1c: QTc:)              -- Encouraged patient to participate in unit milieu and in scheduled group therapies  Hospital Course:     3. Medical Issues Being Addressed:  None   4. Discharge Planning:   -- Social work and case management to assist with discharge planning and identification of hospital follow-up needs prior to discharge  -- Estimated LOS: 5-7 days  Camelia LITTIE Lukes, PA-C 09/12/2024, 8:24 PM

## 2024-09-12 NOTE — BH IP Treatment Plan (Signed)
 Interdisciplinary Treatment and Diagnostic Plan Update  09/12/2024 Time of Session: 10:43 AM Rodney Thomas MRN: [id number]  Principal Diagnosis: Schizoaffective disorder, depressive type (HCC)  Secondary Diagnoses: Principal Problem:   Schizoaffective disorder, depressive type (HCC)   Current Medications:  Current Facility-Administered Medications  Medication Dose Route Frequency Provider Last Rate Last Admin   acetaminophen  (TYLENOL ) tablet 650 mg  650 mg Oral Q6H PRN White, Patrice L, NP       alum & mag hydroxide-simeth (MAALOX/MYLANTA) 200-200-20 MG/5ML suspension 30 mL  30 mL Oral Q4H PRN White, Patrice L, NP       benztropine  (COGENTIN ) tablet 0.5 mg  0.5 mg Oral BID White, Patrice L, NP   0.5 mg at 09/12/24 0920   busPIRone  (BUSPAR ) tablet 10 mg  10 mg Oral TID Teresa Jes L, NP   10 mg at 09/12/24 0920   haloperidol  (HALDOL ) tablet 5 mg  5 mg Oral TID PRN White, Patrice L, NP       And   diphenhydrAMINE  (BENADRYL ) capsule 50 mg  50 mg Oral TID PRN White, Patrice L, NP       haloperidol  lactate (HALDOL ) injection 5 mg  5 mg Intramuscular TID PRN White, Patrice L, NP       And   diphenhydrAMINE  (BENADRYL ) injection 50 mg  50 mg Intramuscular TID PRN White, Patrice L, NP       And   LORazepam  (ATIVAN ) injection 2 mg  2 mg Intramuscular TID PRN White, Patrice L, NP       haloperidol  lactate (HALDOL ) injection 10 mg  10 mg Intramuscular TID PRN White, Patrice L, NP       And   diphenhydrAMINE  (BENADRYL ) injection 50 mg  50 mg Intramuscular TID PRN White, Patrice L, NP       And   LORazepam  (ATIVAN ) injection 2 mg  2 mg Intramuscular TID PRN White, Patrice L, NP       divalproex  (DEPAKOTE  ER) 24 hr tablet 1,000 mg  1,000 mg Oral QHS White, Patrice L, NP   1,000 mg at 09/11/24 2131   hydrOXYzine  (ATARAX ) tablet 25 mg  25 mg Oral TID PRN White, Patrice L, NP       magnesium  hydroxide (MILK OF MAGNESIA) suspension 30 mL  30 mL Oral Daily PRN White, Patrice L, NP        risperiDONE  (RISPERDAL ) tablet 3 mg  3 mg Oral QHS White, Patrice L, NP   3 mg at 09/11/24 2131   sertraline  (ZOLOFT ) tablet 50 mg  50 mg Oral Once White, Patrice L, NP       traZODone  (DESYREL ) tablet 50 mg  50 mg Oral QHS PRN White, Patrice L, NP       PTA Medications: Medications Prior to Admission  Medication Sig Dispense Refill Last Dose/Taking   benztropine  (COGENTIN ) 0.5 MG tablet Take 1 tablet (0.5 mg total) by mouth every 12 (twelve) hours. 60 tablet 1    busPIRone  (BUSPAR ) 10 MG tablet Take 1 tablet (10 mg total) by mouth 3 (three) times daily. 90 tablet 1    divalproex  (DEPAKOTE  ER) 500 MG 24 hr tablet Take 2 tablets (1,000 mg total) by mouth at bedtime. 60 tablet 1    hydrOXYzine  (ATARAX ) 25 MG tablet Take 1 tablet (25 mg total) by mouth 3 (three) times daily as needed for anxiety. 75 tablet 1    risperiDONE  (RISPERDAL ) 3 MG tablet Take 1 tablet (3 mg total) by mouth at bedtime.  sertraline  (ZOLOFT ) 50 MG tablet Take 1 tablet (50 mg total) by mouth daily. 30 tablet 1     Patient Stressors: Legal issue   Loss of daughter   Occupational concerns   Traumatic event    Patient Strengths: Ability for insight  Average or above average intelligence  Arboriculturist fund of knowledge  Motivation for treatment/growth  Physical Health  Supportive family/friends  Work skills   Treatment Modalities: Medication Management, Group therapy, Case management,  1 to 1 session with clinician, Psychoeducation, Recreational therapy.   Physician Treatment Plan for Primary Diagnosis: Schizoaffective disorder, depressive type (HCC) Long Term Goal(s): Improvement in symptoms so as ready for discharge   Short Term Goals: Ability to identify changes in lifestyle to reduce recurrence of condition will improve Ability to verbalize feelings will improve Ability to disclose and discuss suicidal ideas Ability to demonstrate self-control will improve Ability to  identify and develop effective coping behaviors will improve Ability to maintain clinical measurements within normal limits will improve Compliance with prescribed medications will improve Ability to identify triggers associated with substance abuse/mental health issues will improve  Medication Management: Evaluate patient's response, side effects, and tolerance of medication regimen.  Therapeutic Interventions: 1 to 1 sessions, Unit Group sessions and Medication administration.  Evaluation of Outcomes: Not Met  Physician Treatment Plan for Secondary Diagnosis: Principal Problem:   Schizoaffective disorder, depressive type (HCC)  Long Term Goal(s): Improvement in symptoms so as ready for discharge   Short Term Goals: Ability to identify changes in lifestyle to reduce recurrence of condition will improve Ability to verbalize feelings will improve Ability to disclose and discuss suicidal ideas Ability to demonstrate self-control will improve Ability to identify and develop effective coping behaviors will improve Ability to maintain clinical measurements within normal limits will improve Compliance with prescribed medications will improve Ability to identify triggers associated with substance abuse/mental health issues will improve     Medication Management: Evaluate patient's response, side effects, and tolerance of medication regimen.  Therapeutic Interventions: 1 to 1 sessions, Unit Group sessions and Medication administration.  Evaluation of Outcomes: Not Met   RN Treatment Plan for Primary Diagnosis: Schizoaffective disorder, depressive type (HCC) Long Term Goal(s): Knowledge of disease and therapeutic regimen to maintain health will improve  Short Term Goals: Ability to verbalize frustration and anger appropriately will improve, Ability to demonstrate self-control, Ability to participate in decision making will improve, Ability to verbalize feelings will improve, Ability to  disclose and discuss suicidal ideas, and Ability to identify and develop effective coping behaviors will improve  Medication Management: RN will administer medications as ordered by provider, will assess and evaluate patient's response and provide education to patient for prescribed medication. RN will report any adverse and/or side effects to prescribing provider.  Therapeutic Interventions: 1 on 1 counseling sessions, Psychoeducation, Medication administration, Evaluate responses to treatment, Monitor vital signs and CBGs as ordered, Perform/monitor CIWA, COWS, AIMS and Fall Risk screenings as ordered, Perform wound care treatments as ordered.  Evaluation of Outcomes: Not Met   LCSW Treatment Plan for Primary Diagnosis: Schizoaffective disorder, depressive type (HCC) Long Term Goal(s): Safe transition to appropriate next level of care at discharge, Engage patient in therapeutic group addressing interpersonal concerns.  Short Term Goals: Engage patient in aftercare planning with referrals and resources, Increase social support, Increase ability to appropriately verbalize feelings, Increase emotional regulation, Facilitate acceptance of mental health diagnosis and concerns, Facilitate patient progression through stages of change regarding substance use  diagnoses and concerns, Identify triggers associated with mental health/substance abuse issues, and Increase skills for wellness and recovery  Therapeutic Interventions: Assess for all discharge needs, 1 to 1 time with Social worker, Explore available resources and support systems, Assess for adequacy in community support network, Educate family and significant other(s) on suicide prevention, Complete Psychosocial Assessment, Interpersonal group therapy.  Evaluation of Outcomes: Not Met   Progress in Treatment: Attending groups: Yes. and No. Participating in groups: Yes. and No. Taking medication as prescribed: Yes. Toleration medication:  Yes. Family/Significant other contact made: No, will contact:  Patient declined. CSW to contact once permission is granted.  Patient understands diagnosis: Yes. Discussing patient identified problems/goals with staff: Yes. Medical problems stabilized or resolved: Yes. Denies suicidal/homicidal ideation: Yes.  Issues/concerns per patient self-inventory: No. Other: None  New problem(s) identified: No, Describe:  None  New Short Term/Long Term Goal(s):detox, elimination of symptoms of psychosis, medication management for mood stabilization; elimination of SI thoughts; development of comprehensive mental wellness/sobriety plan.    Patient Goals:  Just to get better all the way around.  Discharge Plan or Barriers: CSW to assist with the development of appropriate discharge plan.    Reason for Continuation of Hospitalization: Anxiety Depression Suicidal ideation  Estimated Length of Stay: 1-7 days.   Last 3 [place] Suicide Severity Risk Score: Flowsheet Row Admission (Current) from 09/10/2024 in [facility] INPATIENT BE[facility]AL MEDICINE Most recent reading at 09/10/2024 10:12 AM ED from 09/10/2024 in [facility] Most recent reading at 09/10/2024  5:47 AM ED from 09/09/2024 in [facility] Emergency Department at [facility] Most recent reading at 09/10/2024 12:05 AM  C-SSRS RISK CATEGORY Low Risk Low Risk No Risk    Last PHQ 2/9 Scores:    09/10/2024    4:04 AM 03/11/2024   11:34 AM 02/11/2024   11:55 AM  Depression screen PHQ 2/9  Decreased Interest 2 1 1   Down, Depressed, Hopeless 2 1 2   PHQ - 2 Score 4 2 3   Altered sleeping 2 2 1   Tired, decreased energy 0 0 1  Change in appetite 1 1 1   Feeling bad or failure about yourself  2 3 0  Trouble concentrating 2 0 1  Moving slowly or fidgety/restless 0 0 1  Suicidal thoughts 2 1 0  PHQ-9 Score 13 9 8   Difficult doing work/chores Very difficult Somewhat difficult Somewhat difficult    Scribe for  Treatment Team: Alveta CHRISTELLA Kerns, LCSW 09/12/2024 11:40 AM

## 2024-09-12 NOTE — Group Note (Signed)
 Recreation Therapy Group Note   Group Topic:Other  Group Date: 09/12/2024 Start Time: 1445 End Time: 1535 Facilitators: Celestia Jeoffrey BRAVO, LRT, CTRS Location: Craftroom  Activity Description/Intervention: Therapeutic Drumming. Patients with peers and staff were given the opportunity to engage in a leader facilitated [facility] Group Empowerment Drumming Circle with staff from the [employer], in partnership with [employer]. Teaching laboratory technician and trained [employer], Norleen Mon leading with LRT observing and documenting intervention and pt response. This evidenced-based practice targets 7 areas of health and wellbeing in the human experience including: stress-reduction, exercise, self-expression, camaraderie/support, nurturing, spirituality, and music-making (leisure).    Goal Area(s) Addresses:  Patient will engage in pro-social way in music group.  Patient will follow directions of drum leader on the first prompt. Patient will demonstrate no behavioral issues during group.  Patient will identify if a reduction in stress level occurs as a result of participation in therapeutic drum circle.   Affect/Mood: Appropriate   Participation Level: Active and Engaged   Participation Quality: Minimal Cues   Behavior: Calm and Cooperative   Speech/Thought Process: Coherent   Insight: Good   Judgement: Good   Modes of Intervention: Music   Patient Response to Interventions:  Attentive, Engaged, and Receptive   Education Outcome:  Acknowledges education   Clinical Observations/Individualized Feedback: Rodney Thomas was active in their participation of session activities and group discussion.    Plan: Continue to engage patient in RT group sessions 2-3x/week.   Jeoffrey BRAVO Celestia, LRT, CTRS 09/12/2024 4:56 PM

## 2024-09-12 NOTE — Group Note (Signed)
 Date:  09/12/2024 Time:  10:05 AM  Group Topic/Focus:  Goals Group:   The focus of this group is to help patients establish daily goals to achieve during treatment and discuss how the patient can incorporate goal setting into their daily lives to aide in recovery.    Participation Level:  Did Not Attend   Rodney Thomas 09/12/2024, 10:05 AM

## 2024-09-12 NOTE — Group Note (Signed)
 Date:  09/12/2024 Time:  10:50 PM  Group Topic/Focus:  Self Care:   The focus of this group is to help patients understand the importance of self-care in order to improve or restore emotional, physical, spiritual, interpersonal, and financial health.    Participation Level:  Active  Participation Quality:  Appropriate  Affect:  Appropriate  Cognitive:  Appropriate  Insight: Appropriate  Engagement in Group:  Engaged  Modes of Intervention:  Discussion  Additional Comments:    Rodney Thomas 09/12/2024, 10:50 PM

## 2024-09-12 NOTE — Group Note (Signed)
 Date:  09/12/2024 Time:  7:40 AM  Group Topic/Focus:  Personal Choices and Values:   The focus of this group is to help patients assess and explore the importance of values in their lives, how their values affect their decisions, how they express their values and what opposes their expression. Self Care:   The focus of this group is to help patients understand the importance of self-care in order to improve or restore emotional, physical, spiritual, interpersonal, and financial health. Self Esteem Action Plan:   The focus of this group is to help patients create a plan to continue to build self-esteem after discharge. Wrap-Up Group:   The focus of this group is to help patients review their daily goal of treatment and discuss progress on daily workbooks.    Participation Level:  Active  Participation Quality:  Appropriate and Attentive  Affect:  Appropriate  Cognitive:  Alert, Appropriate, and Oriented  Insight: Appropriate and Good  Engagement in Group:  Engaged  Modes of Intervention:  Discussion and Support  Additional Comments:  N/A  Rodney Thomas 09/12/2024, 7:40 AM

## 2024-09-12 NOTE — Group Note (Signed)
 [facility] LCSW Group Therapy Note   Group Date: 09/12/2024 Start Time: 1300 End Time: 1400   Type of Therapy/Topic:  Group Therapy:  Emotion Regulation  Participation Level:  Active   Mood: Appropriate   Description of Group:    The purpose of this group is to assist patients in learning to regulate negative emotions and experience positive emotions. Patients will be guided to discuss ways in which they have been vulnerable to their negative emotions. These vulnerabilities will be juxtaposed with experiences of positive emotions or situations, and patients challenged to use positive emotions to combat negative ones. Special emphasis will be placed on coping with negative emotions in conflict situations, and patients will process healthy conflict resolution skills.  Therapeutic Goals: Patient will identify two positive emotions or experiences to reflect on in order to balance out negative emotions:  Patient will label two or more emotions that they find the most difficult to experience:  Patient will be able to demonstrate positive conflict resolution skills through discussion or role plays:   Summary of Patient Progress:   During group, patient and group explored the ways in which our thoughts can impact our feelings which impacts our behaviors. Group along with facilitator completed a thermometer activity where different areas of life were explored. Participants were asked to notate in which zone these areas exist in on their personal thermometers. The group then discussed coping skills, and safety plans to help better prepare for potential stressors and learn to better emotionally regulate.      Therapeutic Modalities:   Cognitive Behavioral Therapy Feelings Identification Dialectical Behavioral Therapy   Alveta CHRISTELLA Kerns, LCSW

## 2024-09-13 DIAGNOSIS — F251 Schizoaffective disorder, depressive type: Secondary | ICD-10-CM | POA: Diagnosis not present

## 2024-09-13 MED ORDER — ADULT MULTIVITAMIN W/MINERALS CH
1.0000 | ORAL_TABLET | Freq: Every day | ORAL | Status: DC
  Administered 2024-09-14 – 2024-09-19 (×6): 1 via ORAL
  Filled 2024-09-13 (×6): qty 1

## 2024-09-13 MED ORDER — BUSPIRONE HCL 5 MG PO TABS
10.0000 mg | ORAL_TABLET | Freq: Two times a day (BID) | ORAL | Status: DC
  Administered 2024-09-14 – 2024-09-19 (×11): 10 mg via ORAL
  Filled 2024-09-13 (×11): qty 2

## 2024-09-13 MED ORDER — ENSURE PLUS HIGH PROTEIN PO LIQD
237.0000 mL | Freq: Three times a day (TID) | ORAL | Status: DC
  Administered 2024-09-13 – 2024-09-19 (×14): 237 mL via ORAL

## 2024-09-13 MED ORDER — BENZTROPINE MESYLATE 1 MG PO TABS: 0.5000 mg | ORAL_TABLET | Freq: Two times a day (BID) | ORAL | Status: DC | PRN

## 2024-09-13 MED ORDER — PANTOPRAZOLE SODIUM 20 MG PO TBEC
20.0000 mg | DELAYED_RELEASE_TABLET | Freq: Every day | ORAL | Status: DC
  Administered 2024-09-14 – 2024-09-19 (×6): 20 mg via ORAL
  Filled 2024-09-13 (×6): qty 1

## 2024-09-13 MED ORDER — RISPERIDONE 1 MG PO TABS
2.0000 mg | ORAL_TABLET | Freq: Two times a day (BID) | ORAL | Status: DC
  Administered 2024-09-13 – 2024-09-19 (×12): 2 mg via ORAL
  Filled 2024-09-13 (×12): qty 2

## 2024-09-13 NOTE — Progress Notes (Signed)
 [facility] MD Progress Note  09/13/2024 5:12 PM Rodney Thomas  MRN:  [id number]   Subjective:  Chart reviewed, case discussed in multidisciplinary meeting, patient seen during rounds.   10/21: On interview today, patient is noted to be alert, oriented, and cooperative.  He continues to display paranoia and and rapid speech.  He reports intrusive thoughts that his food could be poisoned but does have some insight that this is not likely.  He states he is still able to eat most of his meals.  Provider reassures patient regarding food safety. Patient's nutritional needs have been evaluated by dietician; Ensure supplements ordered. He also displays paranoia when discussing his medications today, stating he is worried about being given a medication overdose intentionally by the hospital.  Provider reassures patient that his medications and doses are appropriate and that every effort is taken by hospital staff to ensure that medications are administered safely.  Patient is visibly reassured. Patient rates depression as 8-9 out of 10 and anxiety as 0-1 out of 10 today.  He reports suicidal ideation without intent or plan.  He states he feels safe on the unit and is able to contract for safety.  He denies HI/plan.  He denies hallucinations.  He reports tolerating current medication regimen well without adverse effects.  Patient reports he will return to living with his mom upon hospital discharge and feels this is a positive arrangement.  Patient voices frustration about his inability to keep a job due to his mental health. Discussed recommended medication adjustments with patient, including increasing daily risperidone  dose and dividing dose to twice daily.  Discussed decreasing BuSpar  from 3 times daily to twice daily due to improvement in anxiety.  Discussed changing Cogentin  from twice daily scheduled dosing to as needed dosing.  Patient is agreeable to these changes.  10/20: On interview today, patient is noted  to be alert and oriented.  He displays paranoia, stating that he feels like someone is trying to get him or put poison in his food.  He states he feels safe on the unit overall but states he feels the need to be cautious.  He reports passive SI without intent or plan.  He denies HI/plan and denies hallucinations.  He has been compliant with medications.  He reports tolerating current medication regimen well without adverse effects.    Sleep: Fair  Appetite:  Fair  Past Psychiatric History: see h&P Family History: History reviewed. No pertinent family history. Social History:  Social History   Substance and Sexual Activity  Alcohol Use No     Social History   Substance and Sexual Activity  Drug Use No    Social History   Socioeconomic History   Marital status: Single    Spouse name: Not on file   Number of children: Not on file   Years of education: Not on file   Highest education level: Not on file  Occupational History   Not on file  Tobacco Use   Smoking status: Never   Smokeless tobacco: Never  Vaping Use   Vaping status: Never Used  Substance and Sexual Activity   Alcohol use: No   Drug use: No   Sexual activity: Not Currently  Other Topics Concern   Not on file  Social History Narrative   Not on file   Social Drivers of Health   Financial Resource Strain: Not on file  Food Insecurity: Food Insecurity Present (09/10/2024)   Hunger Vital Sign    Worried About Running  Out of Food in the Last Year: Sometimes true    Ran Out of Food in the Last Year: Sometimes true  Transportation Needs: No Transportation Needs (09/10/2024)   PRAPARE - Administrator, Civil Service (Medical): No    Lack of Transportation (Non-Medical): No  Physical Activity: Not on file  Stress: Not on file  Social Connections: Not on file   Past Medical History:  Past Medical History:  Diagnosis Date   Chronic insomnia 01/16/2024   Obsessive-compulsive behavior 01/16/2024    Schizoaffective disorder, bipolar type (HCC) 01/16/2024   Seasonal allergies    Vitamin D  deficiency 01/17/2024    Past Surgical History:  Procedure Laterality Date   APPENDECTOMY     ORIF RADIAL FRACTURE Left 03/17/2014   Procedure: OPEN REDUCTION INTERNAL FIXATION (ORIF) RADIAL FRACTURE;  Surgeon: Oneil JAYSON Herald, MD;  Location: [facility] OR;  Service: Orthopedics;  Laterality: Left;  Open Reduction Internal Fixation Left Radial Shaft Fracture    Current Medications: Current Facility-Administered Medications  Medication Dose Route Frequency Provider Last Rate Last Admin   acetaminophen  (TYLENOL ) tablet 650 mg  650 mg Oral Q6H PRN White, Patrice L, NP       alum & mag hydroxide-simeth (MAALOX/MYLANTA) 200-200-20 MG/5ML suspension 30 mL  30 mL Oral Q4H PRN White, Patrice L, NP       benztropine  (COGENTIN ) tablet 0.5 mg  0.5 mg Oral BID PRN Aleks Nawrot L, PA-C       [START ON 09/14/2024] busPIRone  (BUSPAR ) tablet 10 mg  10 mg Oral BID Partick Musselman L, PA-C       haloperidol  (HALDOL ) tablet 5 mg  5 mg Oral TID PRN White, Patrice L, NP       And   diphenhydrAMINE  (BENADRYL ) capsule 50 mg  50 mg Oral TID PRN White, Patrice L, NP       haloperidol  lactate (HALDOL ) injection 5 mg  5 mg Intramuscular TID PRN White, Patrice L, NP       And   diphenhydrAMINE  (BENADRYL ) injection 50 mg  50 mg Intramuscular TID PRN White, Patrice L, NP       And   LORazepam  (ATIVAN ) injection 2 mg  2 mg Intramuscular TID PRN White, Patrice L, NP       haloperidol  lactate (HALDOL ) injection 10 mg  10 mg Intramuscular TID PRN White, Patrice L, NP       And   diphenhydrAMINE  (BENADRYL ) injection 50 mg  50 mg Intramuscular TID PRN White, Patrice L, NP       And   LORazepam  (ATIVAN ) injection 2 mg  2 mg Intramuscular TID PRN White, Patrice L, NP       divalproex  (DEPAKOTE  ER) 24 hr tablet 1,000 mg  1,000 mg Oral QHS White, Patrice L, NP   1,000 mg at 09/12/24 2120   feeding supplement (ENSURE PLUS HIGH PROTEIN) liquid 237  mL  237 mL Oral TID BM Jadapalle, Sree, MD       hydrOXYzine  (ATARAX ) tablet 25 mg  25 mg Oral TID PRN White, Patrice L, NP       magnesium  hydroxide (MILK OF MAGNESIA) suspension 30 mL  30 mL Oral Daily PRN White, Patrice L, NP       [START ON 09/14/2024] multivitamin with minerals tablet 1 tablet  1 tablet Oral Daily Jadapalle, Sree, MD       [START ON 09/14/2024] pantoprazole (PROTONIX) EC tablet 20 mg  20 mg Oral Daily Carlyle Mcelrath L, PA-C  risperiDONE  (RISPERDAL ) tablet 2 mg  2 mg Oral BID Louanna Vanliew L, PA-C       sertraline  (ZOLOFT ) tablet 50 mg  50 mg Oral Once White, Patrice L, NP       traZODone  (DESYREL ) tablet 50 mg  50 mg Oral QHS PRN White, Patrice L, NP        Lab Results:  No results found for this or any previous visit (from the past 48 hours).   Blood Alcohol level:  Lab Results  Component Value Date   [facility] <15 09/10/2024   ETH <10 02/08/2024    Metabolic Disorder Labs: Lab Results  Component Value Date   HGBA1C 5.2 09/10/2024   MPG 102.54 09/10/2024   MPG 108.28 01/16/2024   No results found for: PROLACTIN Lab Results  Component Value Date   CHOL 290 (H) 09/10/2024   TRIG 39 09/10/2024   HDL 61 09/10/2024   CHOLHDL 4.8 09/10/2024   VLDL 8 09/10/2024   LDLCALC 221 (H) 09/10/2024   LDLCALC 72 01/16/2024    Physical Findings: AIMS:  , ,  ,  ,    CIWA:    COWS:      Psychiatric Specialty Exam:  Presentation  General Appearance:  Appropriate for Environment  Eye Contact: Fair  Speech: Rapid  Speech Volume: Normal    Mood and Affect  Mood: Depressed  Affect: Congruent   Thought Process  Thought Processes: Coherent  Descriptions of Associations:Intact  Orientation:Full (Time, Place and Person)  Thought Content: Paranoia  Hallucinations: None Ideas of Reference:None  Suicidal Thoughts: Yes, without intent or plan Homicidal Thoughts: No Sensorium  Memory: Immediate Fair; Recent Fair; Remote  Fair  Judgment: Fair  Insight: Fair   Art therapist  Concentration: Fair  Attention Span: Fair  Recall: [employer] of Knowledge: Fair  Language: Fair   Psychomotor Activity  Psychomotor Activity:No data recorded Musculoskeletal: Strength & Muscle Tone: within normal limits Gait & Station: normal Assets  Assets: Manufacturing systems engineer; Desire for Improvement    Physical Exam: Physical Exam ROS Blood pressure 118/75, pulse 78, temperature 98.4 F (36.9 C), temperature source Oral, resp. rate 20, height 5' 10 (1.778 m), weight 72.6 kg, SpO2 99%. Body mass index is 22.96 kg/m.  Diagnosis: Principal Problem:   Schizoaffective disorder, depressive type (HCC)   PLAN: Safety and Monitoring:  -- Voluntary admission to inpatient psychiatric unit for safety, stabilization and treatment  -- Daily contact with patient to assess and evaluate symptoms and progress in treatment  -- Patient's case to be discussed in multi-disciplinary team meeting  -- Observation Level : q15 minute checks  -- Vital signs:  q12 hours  -- Precautions: suicide, elopement, and assault -- Encouraged patient to participate in unit milieu and in scheduled group therapies   2. Psychiatric Diagnoses and Treatment:        Schizoaffective Disorder: Risperidone  2 mg twice daily BuSpar  10 mg twice daily Depakote  ER 1000 mg at bedtime Cogentin  0.5 mg twice daily as needed     -- The risks/benefits/side-effects/alternatives to this medication were discussed in detail with the patient and time was given for questions. The patient consents to medication trial.                -- Metabolic profile and EKG monitoring obtained while on an atypical antipsychotic (BMI: Lipid Panel: HbgA1c: QTc:)              -- Encouraged patient to participate in unit milieu and in scheduled  group therapies                 Hospital Course:     3. Medical Issues Being Addressed:  Protonix for GERD   4.  Discharge Planning:   -- Social work and case management to assist with discharge planning and identification of hospital follow-up needs prior to discharge  -- Estimated LOS: 5-7 days  Camelia LITTIE Lukes, PA-C 09/13/2024, 5:12 PM

## 2024-09-13 NOTE — Plan of Care (Signed)
   Problem: Education: Goal: Emotional status will improve Outcome: Progressing Goal: Mental status will improve Outcome: Progressing

## 2024-09-13 NOTE — Group Note (Signed)
 Recreation Therapy Group Note   Group Topic:Health and Wellness  Group Date: 09/13/2024 Start Time: 1550 End Time: 1650 Facilitators: Celestia Jeoffrey BRAVO, LRT, CTRS Location: Courtyard  Group Description: [employer]. LRT and patients played games of basketball, drew with chalk, and played corn hole while outside in the courtyard while getting fresh air and sunlight. Music was being played in the background. LRT and peers conversed about different games they have played before, what they do in their free time and anything else that is on their minds. LRT encouraged pts to drink water after being outside, sweating and getting their heart rate up.  Goal Area(s) Addressed: Patient will build on frustration tolerance skills. Patients will partake in a competitive play game with peers. Patients will gain knowledge of new leisure interest/hobby.    Affect/Mood: Appropriate   Participation Level: Active   Participation Quality: Independent   Behavior: Appropriate   Speech/Thought Process: Coherent   Insight: Good   Judgement: Good   Modes of Intervention: Activity   Patient Response to Interventions:  Receptive   Education Outcome:  Acknowledges education   Clinical Observations/Individualized Feedback: Rodney Thomas was active in their participation of session activities and group discussion. Pt interacted well with LRT and peers duration of session.    Plan: Continue to engage patient in RT group sessions 2-3x/week.   Jeoffrey BRAVO Celestia, LRT, CTRS 09/13/2024 5:17 PM

## 2024-09-13 NOTE — Group Note (Signed)
 Date:  09/13/2024 Time:  9:21 PM  Group Topic/Focus:  Wrap-Up Group:   The focus of this group is to help patients review their daily goal of treatment and discuss progress on daily workbooks.    Participation Level:  Active  Participation Quality:  Appropriate and Attentive  Affect:  Appropriate  Cognitive:  Alert and Appropriate  Insight: Appropriate and Good  Engagement in Group:  Engaged  Modes of Intervention:  Discussion  Additional Comments:     Arlester CHRISTELLA Servant 09/13/2024, 9:21 PM

## 2024-09-13 NOTE — Progress Notes (Signed)
 Initial Nutrition Assessment  DOCUMENTATION CODES:   Not applicable  INTERVENTION:   Ensure Plus High Protein po TID, each supplement provides 350 kcal and 20 grams of protein  MVI po daily   NUTRITION DIAGNOSIS:   Inadequate oral intake related to social / environmental circumstances as evidenced by per patient/family report.  GOAL:   Patient will meet greater than or equal to 90% of their needs  MONITOR:   PO intake, Supplement acceptance  REASON FOR ASSESSMENT:   Consult Assessment of nutrition requirement/status  ASSESSMENT:   33 y.o. male with a history of schizoaffective disorder, bipolar type, chronic insomnia, OCD, vitamin D  deficiency and paranoia who is admitted with ongoing depression and suicidal ideation.  Visited pt's room today. Pt in the shower at the time of RD visit. Per chart review, pt reports decreased appetite and oral intake for several months pta. Pt reports ongoing depression since losing his 8 year old daughter in a car accident around April of this year. Spoke with RN, pt is eating fairly well in hospital. RD will add supplements and MVI to help pt meet his estimated needs. Per chart, pt appears weight stable at baseline if his admission weight is correct.   Medications reviewed and include: protonix  Labs reviewed:   Diet Order:   Diet Order             Diet regular Room service appropriate? Yes; Fluid consistency: Thin  Diet effective now                  EDUCATION NEEDS:   No education needs have been identified at this time  Skin:  Skin Assessment: Reviewed RN Assessment  Last BM:  10/20  Height:   Ht Readings from Last 1 Encounters:  09/10/24 5' 10 (1.778 m)   Weight:   Wt Readings from Last 1 Encounters:  09/10/24 72.6 kg   BMI:  Body mass index is 22.96 kg/m.  Estimated Nutritional Needs:   Kcal:  2200-2500kcal/day  Protein:  110-125g/day  Fluid:  2.3-2.6L/day  Augustin Shams MS, RD, LDN If unable to  be reached, please send secure chat to RD inpatient available from 8:00a-4:00p daily

## 2024-09-13 NOTE — Group Note (Signed)
 Recreation Therapy Group Note   Group Topic:Goal Setting  Group Date: 09/13/2024 Start Time: 1000 End Time: 1105 Facilitators: Celestia Jeoffrey BRAVO, LRT, CTRS Location: Craft Room  Group Description: Product/process development scientist. Patients were given many different magazines, a glue stick, markers, and a piece of cardstock paper. LRT and pts discussed the importance of having goals in life. LRT and pts discussed the difference between short-term and long-term goals, as well as what a SMART goal is. LRT encouraged pts to create a vision board, with images they picked and then cut out with safety scissors from the magazine, for themselves, that capture their short and long-term goals. LRT encouraged pts to show and explain their vision board to the group.   Goal Area(s) Addressed:  Patient will gain knowledge of short vs. long term goals.  Patient will identify goals for themselves. Patient will practice setting SMART goals. Patient will verbalize their goals to LRT and peers.  Affect/Mood: Appropriate   Participation Level: Active and Engaged   Participation Quality: Independent   Behavior: Alert, Appropriate, and Cooperative   Speech/Thought Process: Coherent   Insight: Good   Judgement: Fair    Modes of Intervention: Art   Patient Response to Interventions:  Attentive, Engaged, and Receptive   Education Outcome:  Acknowledges education   Clinical Observations/Individualized Feedback: Rodney Thomas was active in their participation of session activities and group discussion. 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    Plan: Continue to engage patient in RT group sessions 2-3x/week.   Jeoffrey BRAVO Celestia, LRT, CTRS 09/13/2024 11:33 AM

## 2024-09-13 NOTE — Progress Notes (Signed)
   09/13/24 0845  Psych Admission Type (Psych Patients Only)  Admission Status Voluntary  Psychosocial Assessment  Patient Complaints Anxiety  Eye Contact Fair  Facial Expression Anxious  Affect Anxious  Speech Soft  Interaction Assertive  Motor Activity Slow  Appearance/Hygiene Unremarkable  Behavior Characteristics Cooperative;Appropriate to situation  Mood Depressed  Aggressive Behavior  Effect No apparent injury  Thought Process  Coherency WDL  Content WDL  Delusions None reported or observed  Perception WDL  Hallucination None reported or observed  Judgment Limited  Confusion WDL  Danger to Self  Current suicidal ideation? Denies  Description of Suicide Plan Denies  Self-Injurious Behavior No self-injurious ideation or behavior indicators observed or expressed   Agreement Not to Harm Self Yes  Description of Agreement Verbal  Danger to Others  Danger to Others None reported or observed

## 2024-09-13 NOTE — Group Note (Signed)
 [facility] LCSW Group Therapy Note   Group Date: 09/13/2024 Start Time: 1300 End Time: 1340  Type of Therapy/Topic:  Group Therapy:  Feelings about Diagnosis  Participation Level:  Minimal    Description of Group:    This group will allow patients to explore their thoughts and feelings about diagnoses they have received. Patients will be guided to explore their level of understanding and acceptance of these diagnoses. Facilitator will encourage patients to process their thoughts and feelings about the reactions of others to their diagnosis, and will guide patients in identifying ways to discuss their diagnosis with significant others in their lives. This group will be process-oriented, with patients participating in exploration of their own experiences as well as giving and receiving support and challenge from other group members.   Therapeutic Goals: 1. Patient will demonstrate understanding of diagnosis as evidence by identifying two or more symptoms of the disorder:  2. Patient will be able to express two feelings regarding the diagnosis 3. Patient will demonstrate ability to communicate their needs through discussion and/or role plays  Summary of Patient Progress: Patient was present for the majority of the group process. He participated in the icebreaker but not further in the larger discussion. However, pt was appropriately behaved during time in the room.    Therapeutic Modalities:   Cognitive Behavioral Therapy Brief Therapy Feelings Identification    Nadara JONELLE Fam, LCSW

## 2024-09-14 DIAGNOSIS — F251 Schizoaffective disorder, depressive type: Secondary | ICD-10-CM | POA: Diagnosis not present

## 2024-09-14 MED ORDER — SERTRALINE HCL 25 MG PO TABS
25.0000 mg | ORAL_TABLET | Freq: Every day | ORAL | Status: DC
Start: 2024-09-15 — End: 2024-09-15
  Administered 2024-09-15: 25 mg via ORAL
  Filled 2024-09-14: qty 1

## 2024-09-14 NOTE — Group Note (Signed)
 Date:  09/14/2024 Time:  9:01 PM  Group Topic/Focus:  Wrap-Up Group:   The focus of this group is to help patients review their daily goal of treatment and discuss progress on daily workbooks.    Participation Level:  Active  Participation Quality:  Appropriate and Attentive  Affect:  Appropriate  Cognitive:  Alert and Appropriate  Insight: Appropriate and Good  Engagement in Group:  Engaged  Modes of Intervention:  Orientation  Additional Comments:     Rodney Thomas 09/14/2024, 9:01 PM

## 2024-09-14 NOTE — Plan of Care (Signed)
   Problem: Education: Goal: Knowledge of [facility] Education information/materials will improve Outcome: Progressing Goal: Emotional status will improve Outcome: Progressing Goal: Mental status will improve Outcome: Progressing Goal: Verbalization of understanding the information provided will improve Outcome: Progressing

## 2024-09-14 NOTE — Plan of Care (Signed)
  Problem: Education: Goal: Knowledge of [facility] Education information/materials will improve Outcome: Not Progressing Goal: Emotional status will improve Outcome: Progressing Goal: Mental status will improve Outcome: Progressing   Problem: Activity: Goal: Interest or engagement in activities will improve Outcome: Progressing   Problem: Safety: Goal: Periods of time without injury will increase Outcome: Progressing

## 2024-09-14 NOTE — [facility] Counselor (Signed)
 CSW assisted the patient in meeting with financial counselors to assist in getting set up with Medicaid.  Sherryle Margo, MSW, LCSW 09/14/2024 3:58 PM

## 2024-09-14 NOTE — Group Note (Signed)
 [facility] LCSW Group Therapy Note   Group Date: 09/14/2024 Start Time: 1300 End Time: 1400   Type of Therapy/Topic:  Group Therapy:  Emotion Regulation  Participation Level:  Did Not Attend   Mood:  Description of Group:    The purpose of this group is to assist patients in learning to regulate negative emotions and experience positive emotions. Patients will be guided to discuss ways in which they have been vulnerable to their negative emotions. These vulnerabilities will be juxtaposed with experiences of positive emotions or situations, and patients challenged to use positive emotions to combat negative ones. Special emphasis will be placed on coping with negative emotions in conflict situations, and patients will process healthy conflict resolution skills.  Therapeutic Goals: Patient will identify two positive emotions or experiences to reflect on in order to balance out negative emotions:  Patient will label two or more emotions that they find the most difficult to experience:  Patient will be able to demonstrate positive conflict resolution skills through discussion or role plays:   Summary of Patient Progress:   Patient declined to attend group.     Therapeutic Modalities:   Cognitive Behavioral Therapy Feelings Identification Dialectical Behavioral Therapy   Sherryle JINNY Margo, LCSW

## 2024-09-14 NOTE — Progress Notes (Signed)
   09/14/24 0945  Psych Admission Type (Psych Patients Only)  Admission Status Voluntary  Psychosocial Assessment  Patient Complaints Anxiety  Eye Contact Fair  Facial Expression Anxious  Affect Anxious  Speech Soft  Interaction Assertive  Motor Activity Slow  Appearance/Hygiene Unremarkable  Behavior Characteristics Cooperative  Mood Sad  Thought Process  Coherency WDL  Content WDL  Delusions None reported or observed  Perception WDL  Hallucination None reported or observed  Judgment Limited  Confusion WDL  Danger to Self  Current suicidal ideation? Denies  Self-Injurious Behavior No self-injurious ideation or behavior indicators observed or expressed   Agreement Not to Harm Self Yes  Danger to Others  Danger to Others None reported or observed

## 2024-09-14 NOTE — Group Note (Signed)
 Date:  09/14/2024 Time:  3:19 PM  Group Topic/Focus:  Goals Group:   The focus of this group is to help patients establish daily goals to achieve during treatment and discuss how the patient can incorporate goal setting into their daily lives to aide in recovery.  Participation Level:  Active  Participation Quality:  Appropriate  Affect:  Appropriate  Cognitive:  Appropriate  Insight: Appropriate  Engagement in Group:  Engaged  Modes of Intervention:  Discussion and Education  Additional Comments:    Nicholas Trompeter A Angelice Piech 09/14/2024, 3:19 PM

## 2024-09-14 NOTE — Progress Notes (Signed)
   09/14/24 0400  Psych Admission Type (Psych Patients Only)  Admission Status Voluntary  Psychosocial Assessment  Patient Complaints Anxiety  Eye Contact Fair  Facial Expression Anxious  Affect Anxious  Speech Soft  Interaction Assertive  Motor Activity Slow  Appearance/Hygiene Unremarkable  Behavior Characteristics Cooperative;Appropriate to situation  Mood Depressed  Aggressive Behavior  Effect No apparent injury  Thought Process  Coherency WDL  Content WDL  Delusions None reported or observed  Perception WDL  Hallucination None reported or observed  Judgment Limited  Confusion WDL  Danger to Self  Current suicidal ideation? Denies  Self-Injurious Behavior No self-injurious ideation or behavior indicators observed or expressed   Agreement Not to Harm Self Yes  Danger to Others  Danger to Others None reported or observed

## 2024-09-14 NOTE — Progress Notes (Signed)
 [facility] MD Progress Note  09/14/2024 2:06 PM Rodney Thomas  MRN:  [id number]   Subjective:  Chart reviewed, case discussed in multidisciplinary meeting, patient seen during rounds.   10/22: On interview today, patient is noted to be seated in his room.  He is calm and cooperative, alert and oriented.  He reports stable sleep and appetite.  He denies SI/HI/plan and denies hallucinations.  He rates depression as a 8-9 out of 10 and anxiety is a 1 out of 10 today.  He reports tolerating current medication regimen well without adverse effects.  Patient does not voice any paranoid thoughts today regarding food, but does again require reassurance that hospital will not give him an intentional medication overdose.  Patient agreeable to adding Zoloft , risks/benefits and potential side effects discussed, all questions answered.  Will check valproic acid  level tomorrow morning, patient agreeable to this.  10/21: On interview today, patient is noted to be alert, oriented, and cooperative.  He continues to display paranoia and and rapid speech.  He reports intrusive thoughts that his food could be poisoned but does have some insight that this is not likely.  He states he is still able to eat most of his meals.  Provider reassures patient regarding food safety. Patient's nutritional needs have been evaluated by dietician; Ensure supplements ordered. He also displays paranoia when discussing his medications today, stating he is worried about being given a medication overdose intentionally by the hospital.  Provider reassures patient that his medications and doses are appropriate and that every effort is taken by hospital staff to ensure that medications are administered safely.  Patient is visibly reassured. Patient rates depression as 8-9 out of 10 and anxiety as 0-1 out of 10 today.  He reports suicidal ideation without intent or plan.  He states he feels safe on the unit and is able to contract for safety.  He denies  HI/plan.  He denies hallucinations.  He reports tolerating current medication regimen well without adverse effects.  Patient reports he will return to living with his mom upon hospital discharge and feels this is a positive arrangement.  Patient voices frustration about his inability to keep a job due to his mental health. Discussed recommended medication adjustments with patient, including increasing daily risperidone  dose and dividing dose to twice daily.  Discussed decreasing BuSpar  from 3 times daily to twice daily due to improvement in anxiety.  Discussed changing Cogentin  from twice daily scheduled dosing to as needed dosing.  Patient is agreeable to these changes.  10/20: On interview today, patient is noted to be alert and oriented.  He displays paranoia, stating that he feels like someone is trying to get him or put poison in his food.  He states he feels safe on the unit overall but states he feels the need to be cautious.  He reports passive SI without intent or plan.  He denies HI/plan and denies hallucinations.  He has been compliant with medications.  He reports tolerating current medication regimen well without adverse effects.    Sleep: Fair  Appetite:  Fair  Past Psychiatric History: see h&P Family History: History reviewed. No pertinent family history. Social History:  Social History   Substance and Sexual Activity  Alcohol Use No     Social History   Substance and Sexual Activity  Drug Use No    Social History   Socioeconomic History   Marital status: Single    Spouse name: Not on file   Number of children:  Not on file   Years of education: Not on file   Highest education level: Not on file  Occupational History   Not on file  Tobacco Use   Smoking status: Never   Smokeless tobacco: Never  Vaping Use   Vaping status: Never Used  Substance and Sexual Activity   Alcohol use: No   Drug use: No   Sexual activity: Not Currently  Other Topics Concern   Not on  file  Social History Narrative   Not on file   Social Drivers of Health   Financial Resource Strain: Not on file  Food Insecurity: Food Insecurity Present (09/10/2024)   Hunger Vital Sign    Worried About Running Out of Food in the Last Year: Sometimes true    Ran Out of Food in the Last Year: Sometimes true  Transportation Needs: No Transportation Needs (09/10/2024)   PRAPARE - Administrator, Civil Service (Medical): No    Lack of Transportation (Non-Medical): No  Physical Activity: Not on file  Stress: Not on file  Social Connections: Not on file   Past Medical History:  Past Medical History:  Diagnosis Date   Chronic insomnia 01/16/2024   Obsessive-compulsive behavior 01/16/2024   Schizoaffective disorder, bipolar type (HCC) 01/16/2024   Seasonal allergies    Vitamin D  deficiency 01/17/2024    Past Surgical History:  Procedure Laterality Date   APPENDECTOMY     ORIF RADIAL FRACTURE Left 03/17/2014   Procedure: OPEN REDUCTION INTERNAL FIXATION (ORIF) RADIAL FRACTURE;  Surgeon: Oneil JAYSON Herald, MD;  Location: [facility] OR;  Service: Orthopedics;  Laterality: Left;  Open Reduction Internal Fixation Left Radial Shaft Fracture    Current Medications: Current Facility-Administered Medications  Medication Dose Route Frequency Provider Last Rate Last Admin   acetaminophen  (TYLENOL ) tablet 650 mg  650 mg Oral Q6H PRN White, Patrice L, NP       alum & mag hydroxide-simeth (MAALOX/MYLANTA) 200-200-20 MG/5ML suspension 30 mL  30 mL Oral Q4H PRN White, Patrice L, NP       benztropine  (COGENTIN ) tablet 0.5 mg  0.5 mg Oral BID PRN Rondle Lohse L, PA-C       busPIRone  (BUSPAR ) tablet 10 mg  10 mg Oral BID Moni Rothrock L, PA-C   10 mg at 09/14/24 9196   haloperidol  (HALDOL ) tablet 5 mg  5 mg Oral TID PRN White, Patrice L, NP       And   diphenhydrAMINE  (BENADRYL ) capsule 50 mg  50 mg Oral TID PRN White, Patrice L, NP       haloperidol  lactate (HALDOL ) injection 5 mg  5 mg  Intramuscular TID PRN White, Patrice L, NP       And   diphenhydrAMINE  (BENADRYL ) injection 50 mg  50 mg Intramuscular TID PRN White, Patrice L, NP       And   LORazepam  (ATIVAN ) injection 2 mg  2 mg Intramuscular TID PRN White, Patrice L, NP       haloperidol  lactate (HALDOL ) injection 10 mg  10 mg Intramuscular TID PRN White, Patrice L, NP       And   diphenhydrAMINE  (BENADRYL ) injection 50 mg  50 mg Intramuscular TID PRN White, Patrice L, NP       And   LORazepam  (ATIVAN ) injection 2 mg  2 mg Intramuscular TID PRN White, Patrice L, NP       divalproex  (DEPAKOTE  ER) 24 hr tablet 1,000 mg  1,000 mg Oral QHS White, Patrice L, NP  1,000 mg at 09/13/24 2129   feeding supplement (ENSURE PLUS HIGH PROTEIN) liquid 237 mL  237 mL Oral TID BM Jadapalle, Sree, MD   237 mL at 09/13/24 2129   hydrOXYzine  (ATARAX ) tablet 25 mg  25 mg Oral TID PRN White, Patrice L, NP       magnesium  hydroxide (MILK OF MAGNESIA) suspension 30 mL  30 mL Oral Daily PRN White, Patrice L, NP       multivitamin with minerals tablet 1 tablet  1 tablet Oral Daily Jadapalle, Sree, MD   1 tablet at 09/14/24 0803   pantoprazole (PROTONIX) EC tablet 20 mg  20 mg Oral Daily Herchel Hopkin L, PA-C   20 mg at 09/14/24 9195   risperiDONE  (RISPERDAL ) tablet 2 mg  2 mg Oral BID Kameshia Madruga L, PA-C   2 mg at 09/14/24 9196   sertraline  (ZOLOFT ) tablet 50 mg  50 mg Oral Once White, Patrice L, NP       traZODone  (DESYREL ) tablet 50 mg  50 mg Oral QHS PRN White, Patrice L, NP        Lab Results:  No results found for this or any previous visit (from the past 48 hours).   Blood Alcohol level:  Lab Results  Component Value Date   [facility] <15 09/10/2024   ETH <10 02/08/2024    Metabolic Disorder Labs: Lab Results  Component Value Date   HGBA1C 5.2 09/10/2024   MPG 102.54 09/10/2024   MPG 108.28 01/16/2024   No results found for: PROLACTIN Lab Results  Component Value Date   CHOL 290 (H) 09/10/2024   TRIG 39 09/10/2024    HDL 61 09/10/2024   CHOLHDL 4.8 09/10/2024   VLDL 8 09/10/2024   LDLCALC 221 (H) 09/10/2024   LDLCALC 72 01/16/2024    Physical Findings: AIMS:  , ,  ,  ,    CIWA:    COWS:      Psychiatric Specialty Exam:  Presentation  General Appearance:  Appropriate for Environment  Eye Contact: Fair  Speech: Rapid  Speech Volume: Normal    Mood and Affect  Mood: Depressed  Affect: Congruent   Thought Process  Thought Processes: Coherent  Descriptions of Associations:Intact  Orientation:Full (Time, Place and Person)  Thought Content: WDL  Hallucinations: None Ideas of Reference:None  Suicidal Thoughts: No Homicidal Thoughts: No Sensorium  Memory: Immediate Fair; Recent Fair; Remote Fair  Judgment: Fair  Insight: Fair   Art therapist  Concentration: Fair  Attention Span: Fair  Recall: [employer] of Knowledge: Fair  Language: Fair   Psychomotor Activity  Psychomotor Activity: Normal Musculoskeletal: Strength & Muscle Tone: within normal limits Gait & Station: normal Assets  Assets: Manufacturing systems engineer; Desire for Improvement    Physical Exam: Physical Exam ROS Blood pressure 110/79, pulse 77, temperature 97.8 F (36.6 C), temperature source Oral, resp. rate 16, height 5' 10 (1.778 m), weight 72.6 kg, SpO2 99%. Body mass index is 22.96 kg/m.  Diagnosis: Principal Problem:   Schizoaffective disorder, depressive type (HCC)   PLAN: Safety and Monitoring:  -- Voluntary admission to inpatient psychiatric unit for safety, stabilization and treatment  -- Daily contact with patient to assess and evaluate symptoms and progress in treatment  -- Patient's case to be discussed in multi-disciplinary team meeting  -- Observation Level : q15 minute checks  -- Vital signs:  q12 hours  -- Precautions: suicide, elopement, and assault -- Encouraged patient to participate in unit milieu and in scheduled group therapies  2.  Psychiatric Diagnoses and Treatment:        Schizoaffective Disorder: Risperidone  2 mg twice daily BuSpar  10 mg twice daily Depakote  ER 1000 mg at bedtime Cogentin  0.5 mg twice daily as needed Zoloft  25 mg once daily     -- The risks/benefits/side-effects/alternatives to this medication were discussed in detail with the patient and time was given for questions. The patient consents to medication trial.                -- Metabolic profile and EKG monitoring obtained while on an atypical antipsychotic (BMI: Lipid Panel: HbgA1c: QTc:)              -- Encouraged patient to participate in unit milieu and in scheduled group therapies                 Hospital Course:     3. Medical Issues Being Addressed:  Protonix for GERD   4. Discharge Planning:   -- Social work and case management to assist with discharge planning and identification of hospital follow-up needs prior to discharge  -- Estimated LOS: 5-7 days  [employer], PA-C 09/14/2024, 2:06 PM

## 2024-09-15 DIAGNOSIS — F251 Schizoaffective disorder, depressive type: Secondary | ICD-10-CM | POA: Diagnosis not present

## 2024-09-15 LAB — VALPROIC ACID LEVEL: Valproic Acid Lvl: 109 ug/mL — ABNORMAL HIGH (ref 50–100)

## 2024-09-15 MED ORDER — SERTRALINE HCL 25 MG PO TABS
50.0000 mg | ORAL_TABLET | Freq: Every day | ORAL | Status: DC
  Administered 2024-09-16 – 2024-09-19 (×4): 50 mg via ORAL
  Filled 2024-09-15 (×4): qty 2

## 2024-09-15 MED ORDER — DIVALPROEX SODIUM ER 500 MG PO TB24
750.0000 mg | ORAL_TABLET | Freq: Every day | ORAL | Status: DC
  Administered 2024-09-15 – 2024-09-18 (×4): 750 mg via ORAL
  Filled 2024-09-15 (×4): qty 1

## 2024-09-15 NOTE — Group Note (Signed)
 [facility] LCSW Group Therapy Note   Group Date: 09/15/2024 Start Time: 1300 End Time: 1345   Type of Therapy/Topic:  Group Therapy:  Balance in Life  Participation Level:  Did Not Attend   Description of Group:    This group will address the concept of balance and how it feels and looks when one is unbalanced. Patients will be encouraged to process areas in their lives that are out of balance, and identify reasons for remaining unbalanced. Facilitators will guide patients utilizing problem- solving interventions to address and correct the stressor making their life unbalanced. Understanding and applying boundaries will be explored and addressed for obtaining  and maintaining a balanced life. Patients will be encouraged to explore ways to assertively make their unbalanced needs known to significant others in their lives, using other group members and facilitator for support and feedback.  Therapeutic Goals: Patient will identify two or more emotions or situations they have that consume much of in their lives. Patient will identify signs/triggers that life has become out of balance:  Patient will identify two ways to set boundaries in order to achieve balance in their lives:  Patient will demonstrate ability to communicate their needs through discussion and/or role plays  Summary of Patient Progress: Patient did not attend group.    Therapeutic Modalities:   Cognitive Behavioral Therapy Solution-Focused Therapy Assertiveness Training   Nadara JONELLE Fam, LCSW

## 2024-09-15 NOTE — Group Note (Signed)
 Recreation Therapy Group Note   Group Topic:Coping Skills  Group Date: 09/15/2024 Start Time: 1000 End Time: 1055 Facilitators: Celestia Jeoffrey BRAVO, LRT, CTRS Location: Craft Room  Group Description: Mind Map.  Patient was provided a blank template of a diagram with 32 blank boxes in a tiered system, branching from the center (similar to a bubble chart). LRT directed patients to label the middle of the diagram Coping Skills. LRT and patients then came up with 8 different coping skills as examples. Pt were directed to record their coping skills in the 2nd tier boxes closest to the center.  Patients would then share their coping skills with the group as LRT wrote them out. LRT gave a handout of 99 different coping skills at the end of group.   Goal Area(s) Addressed: Patients will be able to define "coping skills". Patient will identify new coping skills.  Patient will increase communication.  Affect/Mood: N/A   Participation Level: Did not attend    Clinical Observations/Individualized Feedback: Patient did not attend group.   Plan: Continue to engage patient in RT group sessions 2-3x/week.   Jeoffrey BRAVO Celestia, LRT, CTRS 09/15/2024 11:25 AM

## 2024-09-15 NOTE — Progress Notes (Signed)
 [facility] MD Progress Note  09/15/2024 9:50 PM Rodney Thomas  MRN:  [id number]   Subjective:  Chart reviewed, case discussed in multidisciplinary meeting, patient seen during rounds.   10/23: On interview today, patient is noted to be interacting with peers on the unit.  He returns to room to engage in interview with provider.  He continues to be calm and cooperative.  He reports constipation, last BM was 2 days ago.  He is offered milk of magnesia and later in the day reports having a bowel movement.  He denies abdominal pain, nausea, or decreased appetite.  He rates depression as 6 out of 10 and anxiety as 0 out of 10 today.  He is sleeping well, reports approximately 8 hours of sleep nightly.  He reports appetite is stable.  He denies SI/HI/plan and denies hallucinations.  He denies tremors or confusion, but does report occasional, transient blurred vision that lasts for a few seconds at a time and occurs about twice daily for the past few days.  Will decrease Depakote  dose and monitor.  Patient reports tolerating addition of Zoloft  25 mg well and is agreeable to dose increase.  10/22: On interview today, patient is noted to be seated in his room.  He is calm and cooperative, alert and oriented.  He reports stable sleep and appetite.  He denies SI/HI/plan and denies hallucinations.  He rates depression as a 8-9 out of 10 and anxiety is a 1 out of 10 today.  He reports tolerating current medication regimen well without adverse effects.  Patient does not voice any paranoid thoughts today regarding food, but does again require reassurance that hospital will not give him an intentional medication overdose.  Patient agreeable to adding Zoloft , risks/benefits and potential side effects discussed, all questions answered.  Will check valproic acid  level tomorrow morning, patient agreeable to this.  10/21: On interview today, patient is noted to be alert, oriented, and cooperative.  He continues to display  paranoia and and rapid speech.  He reports intrusive thoughts that his food could be poisoned but does have some insight that this is not likely.  He states he is still able to eat most of his meals.  Provider reassures patient regarding food safety. Patient's nutritional needs have been evaluated by dietician; Ensure supplements ordered. He also displays paranoia when discussing his medications today, stating he is worried about being given a medication overdose intentionally by the hospital.  Provider reassures patient that his medications and doses are appropriate and that every effort is taken by hospital staff to ensure that medications are administered safely.  Patient is visibly reassured. Patient rates depression as 8-9 out of 10 and anxiety as 0-1 out of 10 today.  He reports suicidal ideation without intent or plan.  He states he feels safe on the unit and is able to contract for safety.  He denies HI/plan.  He denies hallucinations.  He reports tolerating current medication regimen well without adverse effects.  Patient reports he will return to living with his mom upon hospital discharge and feels this is a positive arrangement.  Patient voices frustration about his inability to keep a job due to his mental health. Discussed recommended medication adjustments with patient, including increasing daily risperidone  dose and dividing dose to twice daily.  Discussed decreasing BuSpar  from 3 times daily to twice daily due to improvement in anxiety.  Discussed changing Cogentin  from twice daily scheduled dosing to as needed dosing.  Patient is agreeable to these changes.  10/20: On interview today, patient is noted to be alert and oriented.  He displays paranoia, stating that he feels like someone is trying to get him or put poison in his food.  He states he feels safe on the unit overall but states he feels the need to be cautious.  He reports passive SI without intent or plan.  He denies HI/plan and denies  hallucinations.  He has been compliant with medications.  He reports tolerating current medication regimen well without adverse effects.    Sleep: Fair  Appetite:  Fair  Past Psychiatric History: see h&P Family History: History reviewed. No pertinent family history. Social History:  Social History   Substance and Sexual Activity  Alcohol Use No     Social History   Substance and Sexual Activity  Drug Use No    Social History   Socioeconomic History   Marital status: Single    Spouse name: Not on file   Number of children: Not on file   Years of education: Not on file   Highest education level: Not on file  Occupational History   Not on file  Tobacco Use   Smoking status: Never   Smokeless tobacco: Never  Vaping Use   Vaping status: Never Used  Substance and Sexual Activity   Alcohol use: No   Drug use: No   Sexual activity: Not Currently  Other Topics Concern   Not on file  Social History Narrative   Not on file   Social Drivers of Health   Financial Resource Strain: Not on file  Food Insecurity: Food Insecurity Present (09/10/2024)   Hunger Vital Sign    Worried About Running Out of Food in the Last Year: Sometimes true    Ran Out of Food in the Last Year: Sometimes true  Transportation Needs: No Transportation Needs (09/10/2024)   PRAPARE - Administrator, Civil Service (Medical): No    Lack of Transportation (Non-Medical): No  Physical Activity: Not on file  Stress: Not on file  Social Connections: Not on file   Past Medical History:  Past Medical History:  Diagnosis Date   Chronic insomnia 01/16/2024   Obsessive-compulsive behavior 01/16/2024   Schizoaffective disorder, bipolar type (HCC) 01/16/2024   Seasonal allergies    Vitamin D  deficiency 01/17/2024    Past Surgical History:  Procedure Laterality Date   APPENDECTOMY     ORIF RADIAL FRACTURE Left 03/17/2014   Procedure: OPEN REDUCTION INTERNAL FIXATION (ORIF) RADIAL FRACTURE;   Surgeon: Oneil JAYSON Herald, MD;  Location: [facility] OR;  Service: Orthopedics;  Laterality: Left;  Open Reduction Internal Fixation Left Radial Shaft Fracture    Current Medications: Current Facility-Administered Medications  Medication Dose Route Frequency Provider Last Rate Last Admin   acetaminophen  (TYLENOL ) tablet 650 mg  650 mg Oral Q6H PRN White, Patrice L, NP       alum & mag hydroxide-simeth (MAALOX/MYLANTA) 200-200-20 MG/5ML suspension 30 mL  30 mL Oral Q4H PRN White, Patrice L, NP       benztropine  (COGENTIN ) tablet 0.5 mg  0.5 mg Oral BID PRN Antavia Tandy L, PA-C       busPIRone  (BUSPAR ) tablet 10 mg  10 mg Oral BID Raybon Conard L, PA-C   10 mg at 09/15/24 1751   haloperidol  (HALDOL ) tablet 5 mg  5 mg Oral TID PRN White, Patrice L, NP       And   diphenhydrAMINE  (BENADRYL ) capsule 50 mg  50 mg Oral TID PRN  White, Patrice L, NP       haloperidol  lactate (HALDOL ) injection 5 mg  5 mg Intramuscular TID PRN White, Patrice L, NP       And   diphenhydrAMINE  (BENADRYL ) injection 50 mg  50 mg Intramuscular TID PRN White, Patrice L, NP       And   LORazepam  (ATIVAN ) injection 2 mg  2 mg Intramuscular TID PRN White, Patrice L, NP       haloperidol  lactate (HALDOL ) injection 10 mg  10 mg Intramuscular TID PRN White, Patrice L, NP       And   diphenhydrAMINE  (BENADRYL ) injection 50 mg  50 mg Intramuscular TID PRN White, Patrice L, NP       And   LORazepam  (ATIVAN ) injection 2 mg  2 mg Intramuscular TID PRN White, Patrice L, NP       divalproex  (DEPAKOTE  ER) 24 hr tablet 750 mg  750 mg Oral QHS Kashayla Ungerer L, PA-C   750 mg at 09/15/24 2111   feeding supplement (ENSURE PLUS HIGH PROTEIN) liquid 237 mL  237 mL Oral TID BM Donnelly Mellow, MD   237 mL at 09/15/24 2112   hydrOXYzine  (ATARAX ) tablet 25 mg  25 mg Oral TID PRN White, Patrice L, NP       magnesium  hydroxide (MILK OF MAGNESIA) suspension 30 mL  30 mL Oral Daily PRN White, Patrice L, NP   30 mL at 09/15/24 1014   multivitamin with  minerals tablet 1 tablet  1 tablet Oral Daily Jadapalle, Sree, MD   1 tablet at 09/15/24 0827   pantoprazole (PROTONIX) EC tablet 20 mg  20 mg Oral Daily Kodah Maret L, PA-C   20 mg at 09/15/24 9173   risperiDONE  (RISPERDAL ) tablet 2 mg  2 mg Oral BID Zayd Bonet L, PA-C   2 mg at 09/15/24 2111   [START ON 09/16/2024] sertraline  (ZOLOFT ) tablet 50 mg  50 mg Oral Daily Celie Desrochers L, PA-C       traZODone  (DESYREL ) tablet 50 mg  50 mg Oral QHS PRN White, Patrice L, NP        Lab Results:  Results for orders placed or performed during the hospital encounter of 09/10/24 (from the past 48 hours)  Valproic acid  level     Status: Abnormal   Collection Time: 09/15/24  8:14 AM  Result Value Ref Range   Valproic Acid  Lvl 109 (H) 50 - 100 ug/mL    Comment: Performed at [facility], [address] [place], [place] [address]     Blood Alcohol level:  Lab Results  Component Value Date   [facility] <15 09/10/2024   ETH <10 02/08/2024    Metabolic Disorder Labs: Lab Results  Component Value Date   HGBA1C 5.2 09/10/2024   MPG 102.54 09/10/2024   MPG 108.28 01/16/2024   No results found for: PROLACTIN Lab Results  Component Value Date   CHOL 290 (H) 09/10/2024   TRIG 39 09/10/2024   HDL 61 09/10/2024   CHOLHDL 4.8 09/10/2024   VLDL 8 09/10/2024   LDLCALC 221 (H) 09/10/2024   LDLCALC 72 01/16/2024    Physical Findings: AIMS:  , ,  ,  ,    CIWA:    COWS:      Psychiatric Specialty Exam:  Presentation  General Appearance:  Appropriate for Environment  Eye Contact: Fair  Speech: Normal  Speech Volume: Normal    Mood and Affect  Mood: Depressed  Affect: Congruent  Thought Process  Thought Processes: Coherent  Descriptions of Associations:Intact  Orientation:Full (Time, Place and Person)  Thought Content: WDL  Hallucinations: None Ideas of Reference:None  Suicidal Thoughts: No Homicidal Thoughts: No Sensorium  Memory: Immediate  Fair; Recent Fair; Remote Fair  Judgment: Fair  Insight: Fair   Art therapist  Concentration: Fair  Attention Span: Fair  Recall: [employer] of Knowledge: Fair  Language: Fair   Psychomotor Activity  Psychomotor Activity: Normal Musculoskeletal: Strength & Muscle Tone: within normal limits Gait & Station: normal Assets  Assets: Manufacturing systems engineer; Desire for Improvement    Physical Exam: Physical Exam ROS Blood pressure 116/81, pulse 90, temperature 97.7 F (36.5 C), temperature source Oral, resp. rate (!) 22, height 5' 10 (1.778 m), weight 72.6 kg, SpO2 99%. Body mass index is 22.96 kg/m.  Diagnosis: Principal Problem:   Schizoaffective disorder, depressive type (HCC)   PLAN: Safety and Monitoring:  -- Voluntary admission to inpatient psychiatric unit for safety, stabilization and treatment  -- Daily contact with patient to assess and evaluate symptoms and progress in treatment  -- Patient's case to be discussed in multi-disciplinary team meeting  -- Observation Level : q15 minute checks  -- Vital signs:  q12 hours  -- Precautions: suicide, elopement, and assault -- Encouraged patient to participate in unit milieu and in scheduled group therapies   2. Psychiatric Diagnoses and Treatment:        Schizoaffective Disorder: Risperidone  2 mg twice daily BuSpar  10 mg twice daily Depakote  ER 750 mg at bedtime Cogentin  0.5 mg twice daily as needed Zoloft  50 mg once daily     -- The risks/benefits/side-effects/alternatives to this medication were discussed in detail with the patient and time was given for questions. The patient consents to medication trial.                -- Metabolic profile and EKG monitoring obtained while on an atypical antipsychotic (BMI: Lipid Panel: HbgA1c: QTc:)              -- Encouraged patient to participate in unit milieu and in scheduled group therapies                 Hospital Course:     3. Medical Issues  Being Addressed:  Protonix for GERD Milk of magnesia for constipation    4. Discharge Planning:             -- Monday  -- Social work and case management to assist with discharge planning and identification of hospital follow-up needs prior to discharge  -- Estimated LOS: 5-7 days  Camelia LITTIE Lukes, PA-C 09/15/2024, 9:50 PM

## 2024-09-15 NOTE — Group Note (Signed)
 Date:  09/15/2024 Time:  11:16 PM  Group Topic/Focus:  Coping With Mental Health Crisis:   The purpose of this group is to help patients identify strategies for coping with mental health crisis.  Group discusses possible causes of crisis and ways to manage them effectively.    Participation Level:  Active  Participation Quality:  Appropriate  Affect:  Appropriate  Cognitive:  Appropriate  Insight: Appropriate  Engagement in Group:  Engaged  Modes of Intervention:  Education  Additional Comments:    Kenderick Kobler L 09/15/2024, 11:16 PM

## 2024-09-15 NOTE — Plan of Care (Signed)
  Problem: Education: Goal: Emotional status will improve Outcome: Not Progressing Goal: Mental status will improve Outcome: Not Progressing   Problem: Activity: Goal: Interest or engagement in activities will improve Outcome: Progressing   Problem: Health Behavior/Discharge Planning: Goal: Compliance with treatment plan for underlying cause of condition will improve Outcome: Progressing   Problem: Physical Regulation: Goal: Ability to maintain clinical measurements within normal limits will improve Outcome: Progressing   Problem: Safety: Goal: Periods of time without injury will increase Outcome: Progressing

## 2024-09-15 NOTE — Progress Notes (Signed)
   09/15/24 1400  Psych Admission Type (Psych Patients Only)  Admission Status Voluntary  Psychosocial Assessment  Patient Complaints Depression  Eye Contact Fair  Facial Expression Animated  Affect Blunted;Apprehensive  Speech Unremarkable  Interaction Assertive  Motor Activity Other (Comment) (appropriate for developmental age)  Appearance/Hygiene Unremarkable  Behavior Characteristics Cooperative  Mood Pleasant;Apprehensive  Thought Process  Coherency WDL  Content Paranoia  Delusions None reported or observed  Perception WDL  Hallucination None reported or observed  Judgment Limited  Confusion None  Danger to Self  Current suicidal ideation? Denies  Self-Injurious Behavior No self-injurious ideation or behavior indicators observed or expressed   Danger to Others  Danger to Others None reported or observed

## 2024-09-15 NOTE — Group Note (Signed)
 Recreation Therapy Group Note   Group Topic:General Recreation  Group Date: 09/15/2024 Start Time: 1520 End Time: 1620 Facilitators: Celestia Jeoffrey BRAVO, LRT, CTRS Location: Courtyard  Group Description: [employer]. LRT and patients played games of basketball, drew with chalk, and played corn hole while outside in the courtyard while getting fresh air and sunlight. Music was being played in the background. LRT and peers conversed about different games they have played before, what they do in their free time and anything else that is on their minds. LRT encouraged pts to drink water after being outside, sweating and getting their heart rate up.  Goal Area(s) Addressed: Patient will build on frustration tolerance skills. Patients will partake in a competitive play game with peers. Patients will gain knowledge of new leisure interest/hobby.   Affect/Mood: Flat   Participation Level: Moderate   Participation Quality: Independent   Behavior: Appropriate   Speech/Thought Process: Coherent   Insight: Fair   Judgement: Fair    Modes of Intervention: Activity   Patient Response to Interventions:  Receptive   Education Outcome:  Acknowledges education   Clinical Observations/Individualized Feedback: Romyn was active in their participation of session activities and group discussion. Pt interacted well with LRT and peers duration of session.    Plan: Continue to engage patient in RT group sessions 2-3x/week.   Jeoffrey BRAVO Celestia, LRT, CTRS 09/15/2024 5:23 PM

## 2024-09-15 NOTE — Progress Notes (Signed)
 Pt calm and pleasant during assessment denying SI/HI/AVH. Pt observed by this Clinical research associate interacting appropriately with staff and peers on the unit. Pt compliant with medication administration per MD orders. Pt given education, support, and encouragement to be active in his treatment plan. Pt being monitored Q 15 minutes for safety per unit protocol, remains safe on the unit

## 2024-09-16 NOTE — Progress Notes (Signed)
 Pt calm and pleasant during assessment denying SI/HI/AVH. Pt observed by this Clinical research associate interacting appropriately with staff and peers on the unit. Pt compliant with medication administration per MD orders. Pt given education, support, and encouragement to be active in his treatment plan. Pt being monitored Q 15 minutes for safety per unit protocol, remains safe on the unit

## 2024-09-16 NOTE — Plan of Care (Signed)

## 2024-09-16 NOTE — Progress Notes (Signed)
 [facility] MD Progress Note  09/16/2024 4:57 PM Rodney Thomas  MRN:  [id number]   Subjective:  Chart reviewed, case discussed in multidisciplinary meeting, patient seen during rounds.   10/24: On interview today, patient is noted to be calm and cooperative, alert and oriented.  He reports resolution of constipation, stating he has had a bowel movement today.  He denies abdominal pain, nausea/vomiting, or appetite changes.  He reports some improvement in depression with addition of Zoloft , rating depression as 4 out of 10 today.  He denies current anxiety.  He denies SI/HI/plan and denies hallucinations.  Depakote  ER dose was decreased to 750 due to patient reported blurred vision, patient reports some improvement with only 1 episode of transient blurred vision today.  Will continue to monitor due to recent medication changes.   10/23: On interview today, patient is noted to be interacting with peers on the unit.  He returns to room to engage in interview with provider.  He continues to be calm and cooperative.  He reports constipation, last BM was 2 days ago.  He is offered milk of magnesia and later in the day reports having a bowel movement.  He denies abdominal pain, nausea, or decreased appetite.  He rates depression as 6 out of 10 and anxiety as 0 out of 10 today.  He is sleeping well, reports approximately 8 hours of sleep nightly.  He reports appetite is stable.  He denies SI/HI/plan and denies hallucinations.  He denies tremors or confusion, but does report occasional, transient blurred vision that lasts for a few seconds at a time and occurs about twice daily for the past few days.  Will decrease Depakote  dose and monitor.  Patient reports tolerating addition of Zoloft  25 mg well and is agreeable to dose increase.  10/22: On interview today, patient is noted to be seated in his room.  He is calm and cooperative, alert and oriented.  He reports stable sleep and appetite.  He denies SI/HI/plan and  denies hallucinations.  He rates depression as a 8-9 out of 10 and anxiety is a 1 out of 10 today.  He reports tolerating current medication regimen well without adverse effects.  Patient does not voice any paranoid thoughts today regarding food, but does again require reassurance that hospital will not give him an intentional medication overdose.  Patient agreeable to adding Zoloft , risks/benefits and potential side effects discussed, all questions answered.  Will check valproic acid  level tomorrow morning, patient agreeable to this.  10/21: On interview today, patient is noted to be alert, oriented, and cooperative.  He continues to display paranoia and and rapid speech.  He reports intrusive thoughts that his food could be poisoned but does have some insight that this is not likely.  He states he is still able to eat most of his meals.  Provider reassures patient regarding food safety. Patient's nutritional needs have been evaluated by dietician; Ensure supplements ordered. He also displays paranoia when discussing his medications today, stating he is worried about being given a medication overdose intentionally by the hospital.  Provider reassures patient that his medications and doses are appropriate and that every effort is taken by hospital staff to ensure that medications are administered safely.  Patient is visibly reassured. Patient rates depression as 8-9 out of 10 and anxiety as 0-1 out of 10 today.  He reports suicidal ideation without intent or plan.  He states he feels safe on the unit and is able to contract for safety.  He denies HI/plan.  He denies hallucinations.  He reports tolerating current medication regimen well without adverse effects.  Patient reports he will return to living with his mom upon hospital discharge and feels this is a positive arrangement.  Patient voices frustration about his inability to keep a job due to his mental health. Discussed recommended medication adjustments  with patient, including increasing daily risperidone  dose and dividing dose to twice daily.  Discussed decreasing BuSpar  from 3 times daily to twice daily due to improvement in anxiety.  Discussed changing Cogentin  from twice daily scheduled dosing to as needed dosing.  Patient is agreeable to these changes.  10/20: On interview today, patient is noted to be alert and oriented.  He displays paranoia, stating that he feels like someone is trying to get him or put poison in his food.  He states he feels safe on the unit overall but states he feels the need to be cautious.  He reports passive SI without intent or plan.  He denies HI/plan and denies hallucinations.  He has been compliant with medications.  He reports tolerating current medication regimen well without adverse effects.    Sleep: Fair  Appetite:  Fair  Past Psychiatric History: see h&P Family History: History reviewed. No pertinent family history. Social History:  Social History   Substance and Sexual Activity  Alcohol Use No     Social History   Substance and Sexual Activity  Drug Use No    Social History   Socioeconomic History   Marital status: Single    Spouse name: Not on file   Number of children: Not on file   Years of education: Not on file   Highest education level: Not on file  Occupational History   Not on file  Tobacco Use   Smoking status: Never   Smokeless tobacco: Never  Vaping Use   Vaping status: Never Used  Substance and Sexual Activity   Alcohol use: No   Drug use: No   Sexual activity: Not Currently  Other Topics Concern   Not on file  Social History Narrative   Not on file   Social Drivers of Health   Financial Resource Strain: Not on file  Food Insecurity: Food Insecurity Present (09/10/2024)   Hunger Vital Sign    Worried About Running Out of Food in the Last Year: Sometimes true    Ran Out of Food in the Last Year: Sometimes true  Transportation Needs: No Transportation Needs  (09/10/2024)   PRAPARE - Administrator, Civil Service (Medical): No    Lack of Transportation (Non-Medical): No  Physical Activity: Not on file  Stress: Not on file  Social Connections: Not on file   Past Medical History:  Past Medical History:  Diagnosis Date   Chronic insomnia 01/16/2024   Obsessive-compulsive behavior 01/16/2024   Schizoaffective disorder, bipolar type (HCC) 01/16/2024   Seasonal allergies    Vitamin D  deficiency 01/17/2024    Past Surgical History:  Procedure Laterality Date   APPENDECTOMY     ORIF RADIAL FRACTURE Left 03/17/2014   Procedure: OPEN REDUCTION INTERNAL FIXATION (ORIF) RADIAL FRACTURE;  Surgeon: Oneil JAYSON Herald, MD;  Location: [facility] OR;  Service: Orthopedics;  Laterality: Left;  Open Reduction Internal Fixation Left Radial Shaft Fracture    Current Medications: Current Facility-Administered Medications  Medication Dose Route Frequency Provider Last Rate Last Admin   acetaminophen  (TYLENOL ) tablet 650 mg  650 mg Oral Q6H PRN White, Wyline CROME, NP  alum & mag hydroxide-simeth (MAALOX/MYLANTA) 200-200-20 MG/5ML suspension 30 mL  30 mL Oral Q4H PRN White, Patrice L, NP       benztropine  (COGENTIN ) tablet 0.5 mg  0.5 mg Oral BID PRN Trace Wirick L, PA-C       busPIRone  (BUSPAR ) tablet 10 mg  10 mg Oral BID Javarious Elsayed L, PA-C   10 mg at 09/16/24 0819   haloperidol  (HALDOL ) tablet 5 mg  5 mg Oral TID PRN White, Patrice L, NP       And   diphenhydrAMINE  (BENADRYL ) capsule 50 mg  50 mg Oral TID PRN White, Patrice L, NP       haloperidol  lactate (HALDOL ) injection 5 mg  5 mg Intramuscular TID PRN White, Patrice L, NP       And   diphenhydrAMINE  (BENADRYL ) injection 50 mg  50 mg Intramuscular TID PRN White, Patrice L, NP       And   LORazepam  (ATIVAN ) injection 2 mg  2 mg Intramuscular TID PRN White, Patrice L, NP       haloperidol  lactate (HALDOL ) injection 10 mg  10 mg Intramuscular TID PRN White, Patrice L, NP       And    diphenhydrAMINE  (BENADRYL ) injection 50 mg  50 mg Intramuscular TID PRN White, Patrice L, NP       And   LORazepam  (ATIVAN ) injection 2 mg  2 mg Intramuscular TID PRN White, Patrice L, NP       divalproex  (DEPAKOTE  ER) 24 hr tablet 750 mg  750 mg Oral QHS Axzel Rockhill L, PA-C   750 mg at 09/15/24 2111   feeding supplement (ENSURE PLUS HIGH PROTEIN) liquid 237 mL  237 mL Oral TID BM Jadapalle, Sree, MD   237 mL at 09/16/24 0930   hydrOXYzine  (ATARAX ) tablet 25 mg  25 mg Oral TID PRN White, Patrice L, NP       magnesium  hydroxide (MILK OF MAGNESIA) suspension 30 mL  30 mL Oral Daily PRN White, Patrice L, NP   30 mL at 09/15/24 1014   multivitamin with minerals tablet 1 tablet  1 tablet Oral Daily Jadapalle, Sree, MD   1 tablet at 09/16/24 0819   pantoprazole (PROTONIX) EC tablet 20 mg  20 mg Oral Daily Dezarai Prew L, PA-C   20 mg at 09/16/24 9180   risperiDONE  (RISPERDAL ) tablet 2 mg  2 mg Oral BID Chrisette Man L, PA-C   2 mg at 09/16/24 9180   sertraline  (ZOLOFT ) tablet 50 mg  50 mg Oral Daily Lillyanne Bradburn L, PA-C   50 mg at 09/16/24 0827   traZODone  (DESYREL ) tablet 50 mg  50 mg Oral QHS PRN White, Patrice L, NP        Lab Results:  Results for orders placed or performed during the hospital encounter of 09/10/24 (from the past 48 hours)  Valproic acid  level     Status: Abnormal   Collection Time: 09/15/24  8:14 AM  Result Value Ref Range   Valproic Acid  Lvl 109 (H) 50 - 100 ug/mL    Comment: Performed at [facility], [address] [place], [place] [address]     Blood Alcohol level:  Lab Results  Component Value Date   [facility] <15 09/10/2024   ETH <10 02/08/2024    Metabolic Disorder Labs: Lab Results  Component Value Date   HGBA1C 5.2 09/10/2024   MPG 102.54 09/10/2024   MPG 108.28 01/16/2024   No results found for: PROLACTIN  Lab Results  Component Value Date   CHOL 290 (H) 09/10/2024   TRIG 39 09/10/2024   HDL 61 09/10/2024   CHOLHDL 4.8 09/10/2024    VLDL 8 09/10/2024   LDLCALC 221 (H) 09/10/2024   LDLCALC 72 01/16/2024    Physical Findings: AIMS:  , ,  ,  ,    CIWA:    COWS:      Psychiatric Specialty Exam:  Presentation  General Appearance:  Appropriate for Environment  Eye Contact: Fair  Speech: Normal  Speech Volume: Normal    Mood and Affect  Mood: Depressed  Affect: Congruent   Thought Process  Thought Processes: Coherent  Descriptions of Associations:Intact  Orientation:Full (Time, Place and Person)  Thought Content: WDL  Hallucinations: None Ideas of Reference:None  Suicidal Thoughts: No Homicidal Thoughts: No Sensorium  Memory: Immediate Fair; Recent Fair; Remote Fair  Judgment: Fair  Insight: Fair   Art therapist  Concentration: Fair  Attention Span: Fair  Recall: [employer] of Knowledge: Fair  Language: Fair   Psychomotor Activity  Psychomotor Activity: Normal Musculoskeletal: Strength & Muscle Tone: within normal limits Gait & Station: normal Assets  Assets: Manufacturing systems engineer; Desire for Improvement    Physical Exam: Physical Exam ROS Blood pressure 120/77, pulse 75, temperature 98.1 F (36.7 C), temperature source Oral, resp. rate 16, height 5' 10 (1.778 m), weight 72.6 kg, SpO2 100%. Body mass index is 22.96 kg/m.  Diagnosis: Principal Problem:   Schizoaffective disorder, depressive type (HCC)   PLAN: Safety and Monitoring:  -- Voluntary admission to inpatient psychiatric unit for safety, stabilization and treatment  -- Daily contact with patient to assess and evaluate symptoms and progress in treatment  -- Patient's case to be discussed in multi-disciplinary team meeting  -- Observation Level : q15 minute checks  -- Vital signs:  q12 hours  -- Precautions: suicide, elopement, and assault -- Encouraged patient to participate in unit milieu and in scheduled group therapies   2. Psychiatric Diagnoses and Treatment:         Schizoaffective Disorder: Risperidone  2 mg twice daily BuSpar  10 mg twice daily Depakote  ER 750 mg at bedtime Cogentin  0.5 mg twice daily as needed Zoloft  50 mg once daily   -- The risks/benefits/side-effects/alternatives to this medication were discussed in detail with the patient and time was given for questions. The patient consents to medication trial.                -- Metabolic profile and EKG monitoring obtained while on an atypical antipsychotic (BMI: Lipid Panel: HbgA1c: QTc:)              -- Encouraged patient to participate in unit milieu and in scheduled group therapies                 Hospital Course:     3. Medical Issues Being Addressed:  Protonix for GERD Milk of magnesia as needed for constipation    4. Discharge Planning:             -- Monday  -- Social work and case management to assist with discharge planning and identification of hospital follow-up needs prior to discharge  -- Estimated LOS: 5-7 days  Camelia LITTIE Lukes, PA-C 09/16/2024, 4:57 PM

## 2024-09-16 NOTE — Group Note (Signed)
 Recreation Therapy Group Note   Group Topic:Health and Wellness  Group Date: 09/16/2024 Start Time: 1510 End Time: 1600 Facilitators: Celestia Jeoffrey BRAVO, LRT, CTRS Location: Courtyard  Group Description: [employer]. LRT and patients played games of basketball, drew with chalk, and played corn hole while outside in the courtyard while getting fresh air and sunlight. Music was being played in the background. LRT and peers conversed about different games they have played before, what they do in their free time and anything else that is on their minds. LRT encouraged pts to drink water after being outside, sweating and getting their heart rate up.  Goal Area(s) Addressed: Patient will build on frustration tolerance skills. Patients will partake in a competitive play game with peers. Patients will gain knowledge of new leisure interest/hobby.    Affect/Mood: Appropriate   Participation Level: Active   Participation Quality: Independent   Behavior: Appropriate   Speech/Thought Process: Coherent   Insight: Fair   Judgement: Fair    Modes of Intervention: Activity   Patient Response to Interventions:  Receptive   Education Outcome:  Acknowledges education   Clinical Observations/Individualized Feedback: [name] was active in their participation of session activities and group discussion. Pt interacted well with LRT and peers duration of session.    Plan: Continue to engage patient in RT group sessions 2-3x/week.   Jeoffrey BRAVO Celestia, LRT, CTRS 09/16/2024 4:52 PM

## 2024-09-16 NOTE — Group Note (Signed)
 Recreation Therapy Group Note   Group Topic:Leisure Education  Group Date: 09/16/2024 Start Time: 1100 End Time: 1150 Facilitators: Celestia Jeoffrey BRAVO, LRT, CTRS Location: Craft Room  Group Description: Leisure. Patients were given the option to choose from journaling, coloring, drawing, making origami, playing with playdoh, listening to music or singing karaoke. LRT and pts discussed the meaning of leisure, the importance of participating in leisure during their free time/when they're outside of the hospital, as well as how our leisure interests can also serve as coping skills.   Goal Area(s) Addressed:  Patient will identify a current leisure interest.  Patient will learn the definition of "leisure". Patient will practice making a positive decision. Patient will have the opportunity to try a new leisure activity. Patient will communicate with peers and LRT.   Affect/Mood: N/A   Participation Level: Did not attend    Clinical Observations/Individualized Feedback: Patient did not attend group.   Plan: Continue to engage patient in RT group sessions 2-3x/week.   Jeoffrey BRAVO Celestia, LRT, CTRS 09/16/2024 1:09 PM

## 2024-09-16 NOTE — Progress Notes (Signed)
   09/16/24 1500  Psych Admission Type (Psych Patients Only)  Admission Status Voluntary  Psychosocial Assessment  Patient Complaints Depression  Eye Contact Fair  Facial Expression Animated  Affect Depressed  Speech Unremarkable  Interaction Assertive  Motor Activity Other (Comment) (appropriate for developmental age)  Appearance/Hygiene Unremarkable  Behavior Characteristics Cooperative;Appropriate to situation  Mood Pleasant  Thought Process  Coherency WDL  Content Paranoia  Delusions None reported or observed  Perception WDL  Hallucination None reported or observed  Judgment Limited  Confusion None  Danger to Self  Current suicidal ideation? Denies  Self-Injurious Behavior No self-injurious ideation or behavior indicators observed or expressed   Danger to Others  Danger to Others None reported or observed

## 2024-09-16 NOTE — Group Note (Signed)
 Date:  09/16/2024 Time:  5:48 PM  Group Topic/Focus:  Wellness Toolbox:   The focus of this group is to discuss various aspects of wellness, balancing those aspects and exploring ways to increase the ability to experience wellness.  Patients will create a wellness toolbox for use upon discharge.    Participation Level:  Active  Participation Quality:  Appropriate  Affect:  Appropriate  Cognitive:  Appropriate  Insight: Appropriate  Engagement in Group:  Engaged  Modes of Intervention:  Activity and Socialization  Additional Comments:    Deitra Caron Main[place] 09/16/2024, 5:48 PM

## 2024-09-16 NOTE — Plan of Care (Signed)
   Problem: Education: Goal: Emotional status will improve Outcome: Progressing Goal: Mental status will improve Outcome: Progressing

## 2024-09-17 NOTE — Progress Notes (Signed)
 [facility] MD Progress Note  09/17/2024 4:03 PM Rodney Thomas  MRN:  [id number]   Subjective:  Chart reviewed, case discussed in multidisciplinary meeting, patient seen during rounds.   Upon interview, patient was pleasant and cooperative. Patient expressed excitement to go home. He acknowledges the plan for discharge on Monday. He reports that he is doing much better. Reports continued intermittent blurred vision, but reports this has been occurring for about 6 months and he believes he needs to see an eye doctor to get his vision checked. Denies that this is causing him distress or increasing in frequency.   Nursing reports patient did appear to have some concern over color of medicine and paranoia that it was the correct medication. Patient was able to discuss color differences In medication along with what medications he is taking with nursing.   Patient denies SI, HI, and AVH. Denies concerns/complaints.   Sleep: Fair  Appetite:  Good  Past Psychiatric History: see h&P Family History: History reviewed. No pertinent family history. Social History:  Social History   Substance and Sexual Activity  Alcohol Use No     Social History   Substance and Sexual Activity  Drug Use No    Social History   Socioeconomic History   Marital status: Single    Spouse name: Not on file   Number of children: Not on file   Years of education: Not on file   Highest education level: Not on file  Occupational History   Not on file  Tobacco Use   Smoking status: Never   Smokeless tobacco: Never  Vaping Use   Vaping status: Never Used  Substance and Sexual Activity   Alcohol use: No   Drug use: No   Sexual activity: Not Currently  Other Topics Concern   Not on file  Social History Narrative   Not on file   Social Drivers of Health   Financial Resource Strain: Not on file  Food Insecurity: Food Insecurity Present (09/10/2024)   Hunger Vital Sign    Worried About Running Out of Food  in the Last Year: Sometimes true    Ran Out of Food in the Last Year: Sometimes true  Transportation Needs: No Transportation Needs (09/10/2024)   PRAPARE - Administrator, Civil Service (Medical): No    Lack of Transportation (Non-Medical): No  Physical Activity: Not on file  Stress: Not on file  Social Connections: Not on file   Past Medical History:  Past Medical History:  Diagnosis Date   Chronic insomnia 01/16/2024   Obsessive-compulsive behavior 01/16/2024   Schizoaffective disorder, bipolar type (HCC) 01/16/2024   Seasonal allergies    Vitamin D  deficiency 01/17/2024    Past Surgical History:  Procedure Laterality Date   APPENDECTOMY     ORIF RADIAL FRACTURE Left 03/17/2014   Procedure: OPEN REDUCTION INTERNAL FIXATION (ORIF) RADIAL FRACTURE;  Surgeon: Oneil JAYSON Herald, MD;  Location: [facility] OR;  Service: Orthopedics;  Laterality: Left;  Open Reduction Internal Fixation Left Radial Shaft Fracture    Current Medications: Current Facility-Administered Medications  Medication Dose Route Frequency Provider Last Rate Last Admin   acetaminophen  (TYLENOL ) tablet 650 mg  650 mg Oral Q6H PRN White, Patrice L, NP       alum & mag hydroxide-simeth (MAALOX/MYLANTA) 200-200-20 MG/5ML suspension 30 mL  30 mL Oral Q4H PRN White, Patrice L, NP       benztropine  (COGENTIN ) tablet 0.5 mg  0.5 mg Oral BID PRN Hunter, Crystal L, PA-C  busPIRone  (BUSPAR ) tablet 10 mg  10 mg Oral BID Hunter, Crystal L, PA-C   10 mg at 09/17/24 0831   haloperidol  (HALDOL ) tablet 5 mg  5 mg Oral TID PRN White, Patrice L, NP       And   diphenhydrAMINE  (BENADRYL ) capsule 50 mg  50 mg Oral TID PRN White, Patrice L, NP       haloperidol  lactate (HALDOL ) injection 5 mg  5 mg Intramuscular TID PRN White, Patrice L, NP       And   diphenhydrAMINE  (BENADRYL ) injection 50 mg  50 mg Intramuscular TID PRN White, Patrice L, NP       And   LORazepam  (ATIVAN ) injection 2 mg  2 mg Intramuscular TID PRN White, Patrice  L, NP       haloperidol  lactate (HALDOL ) injection 10 mg  10 mg Intramuscular TID PRN White, Patrice L, NP       And   diphenhydrAMINE  (BENADRYL ) injection 50 mg  50 mg Intramuscular TID PRN White, Patrice L, NP       And   LORazepam  (ATIVAN ) injection 2 mg  2 mg Intramuscular TID PRN White, Patrice L, NP       divalproex  (DEPAKOTE  ER) 24 hr tablet 750 mg  750 mg Oral QHS Hunter, Crystal L, PA-C   750 mg at 09/16/24 2119   feeding supplement (ENSURE PLUS HIGH PROTEIN) liquid 237 mL  237 mL Oral TID BM Jadapalle, Sree, MD   237 mL at 09/17/24 9165   hydrOXYzine  (ATARAX ) tablet 25 mg  25 mg Oral TID PRN White, Patrice L, NP       magnesium  hydroxide (MILK OF MAGNESIA) suspension 30 mL  30 mL Oral Daily PRN White, Patrice L, NP   30 mL at 09/17/24 1341   multivitamin with minerals tablet 1 tablet  1 tablet Oral Daily Jadapalle, Sree, MD   1 tablet at 09/17/24 0831   pantoprazole (PROTONIX) EC tablet 20 mg  20 mg Oral Daily Hunter, Crystal L, PA-C   20 mg at 09/17/24 9167   risperiDONE  (RISPERDAL ) tablet 2 mg  2 mg Oral BID Hunter, Crystal L, PA-C   2 mg at 09/17/24 9168   sertraline  (ZOLOFT ) tablet 50 mg  50 mg Oral Daily Hunter, Crystal L, PA-C   50 mg at 09/17/24 0831   traZODone  (DESYREL ) tablet 50 mg  50 mg Oral QHS PRN White, Patrice L, NP        Lab Results: No results found for this or any previous visit (from the past 48 hours).  Blood Alcohol level:  Lab Results  Component Value Date   [facility] <15 09/10/2024   ETH <10 02/08/2024    Metabolic Disorder Labs: Lab Results  Component Value Date   HGBA1C 5.2 09/10/2024   MPG 102.54 09/10/2024   MPG 108.28 01/16/2024   No results found for: PROLACTIN Lab Results  Component Value Date   CHOL 290 (H) 09/10/2024   TRIG 39 09/10/2024   HDL 61 09/10/2024   CHOLHDL 4.8 09/10/2024   VLDL 8 09/10/2024   LDLCALC 221 (H) 09/10/2024   LDLCALC 72 01/16/2024     Psychiatric Specialty Exam:  Presentation  General Appearance:   Appropriate for Environment  Eye Contact: Fair  Speech: Clear and Coherent; Normal Rate  Speech Volume: Normal    Mood and Affect  Mood: Euthymic  Affect: Appropriate   Thought Process  Thought Processes: Coherent; Linear  Orientation:Full (Time, Place and Person)  Thought  Content:Logical  Hallucinations:Hallucinations: None  Ideas of Reference:None  Suicidal Thoughts:Suicidal Thoughts: No  Homicidal Thoughts:Homicidal Thoughts: No   Sensorium  Memory: Immediate Fair; Recent Fair; Remote Fair  Judgment: Fair  Insight: Fair   Art Therapist  Concentration: Fair  Attention Span: Fair  Recall: [employer] of Knowledge: Fair  Language: Fair   Psychomotor Activity  Psychomotor Activity: Psychomotor Activity: Normal  Musculoskeletal: Strength & Muscle Tone: within normal limits Gait & Station: normal Assets  Assets: Manufacturing Systems Engineer; Desire for Improvement    Physical Exam: Physical Exam Vitals and nursing note reviewed.  Constitutional:      Appearance: Normal appearance.  Cardiovascular:     Rate and Rhythm: Normal rate.  Pulmonary:     Effort: Pulmonary effort is normal.  Neurological:     Mental Status: He is alert and oriented to person, place, and time.  Psychiatric:        Mood and Affect: Mood normal.        Behavior: Behavior normal.    Review of Systems  Respiratory:  Negative for shortness of breath.   Cardiovascular:  Negative for chest pain.  Gastrointestinal:  Negative for nausea and vomiting.  Psychiatric/Behavioral:  Negative for hallucinations and suicidal ideas.   All other systems reviewed and are negative.  Blood pressure 120/84, pulse 81, temperature 97.6 F (36.4 C), temperature source Oral, resp. rate 18, height 5' 10 (1.778 m), weight 72.6 kg, SpO2 99%. Body mass index is 22.96 kg/m.  Diagnosis: Principal Problem:   Schizoaffective disorder, depressive type (HCC)   PLAN: Safety  and Monitoring:  -- Voluntary admission to inpatient psychiatric unit for safety, stabilization and treatment  -- Daily contact with patient to assess and evaluate symptoms and progress in treatment  -- Patient's case to be discussed in multi-disciplinary team meeting  -- Observation Level : q15 minute checks  -- Vital signs:  q12 hours  -- Precautions: suicide, elopement, and assault -- Encouraged patient to participate in unit milieu and in scheduled group therapies  2. Psychiatric Treatment:  Scheduled Medications:  Risperidone  2 mg twice daily BuSpar  10 mg twice daily Depakote  ER 750 mg at bedtime Cogentin  0.5 mg twice daily as needed Zoloft  50 mg once daily  Repeat Valproic Acid  level 09/18/24 due to previous level being elevated.    -- The risks/benefits/side-effects/alternatives to this medication were discussed in detail with the patient and time was given for questions. The patient consents to medication trial.   3. Medical Issues Being Addressed:  Protonix for GERD Milk of magnesia as needed for constipation   4. Discharge Planning:   -- Social work and case management to assist with discharge planning and identification of hospital follow-up needs prior to discharge  -- Estimated LOS: 5-7 days (estimated discharge of Monday 09/19/24)   Glenys Luetta Piazza, NP 09/17/2024, 4:03 PM

## 2024-09-17 NOTE — Plan of Care (Signed)
   Problem: Education: Goal: Emotional status will improve Outcome: Progressing Goal: Mental status will improve Outcome: Progressing

## 2024-09-17 NOTE — Group Note (Signed)
 Date:  09/17/2024 Time:  1:56 PM  Group Topic/Focus:    Chief Of Staff. Patients were given the opportunity to go outside to the courtyard to play basketball, corn-hole, draw with chalk and listen to music while enjoying fresh air and sunlight.    Participation Level:  Active  Participation Quality:  Appropriate  Rodney Thomas 09/17/2024, 1:56 PM

## 2024-09-17 NOTE — Group Note (Signed)
 Recreation Therapy Group Note   Group Topic:Goal Setting  Group Date: 09/17/2024 Start Time: 1000 End Time: 1040 Facilitators: Celestia Jeoffrey BRAVO, LRT, CTRS Location: Craft Room  Group description: Now Future Wall. Patients were given a sheet of paper and asked to fold it into 3 sections, like a pamphlet. Top section, patients were encouraged to write what they are feeling or experiencing "now". The bottom section, patients were asked to fill out how they want to feel or things they want to experience in the "future".  In the middle section, patients were encouraged to fill out any "walls" or barriers that are getting in the way of them reaching their "future". On the back of the sheet, patients were encouraged to write positive coping skills that will help them get over or though the walls they experience. LRT and patients discussed each of the sections and shared them aloud in group. Patients are encouraged to keep this paper with them as a guide/plan post discharge.     Goal Area(s) Addressed:    Patients will identify walls/triggers.   Patients will identify and list coping skills to use post-discharge.   Patients will work on goal setting, short or long-term.   Patients will work on communication by reading aloud to group and engaging in post activity discussion.    Affect/Mood: N/A   Participation Level: Did not attend    Clinical Observations/Individualized Feedback: Patient did not attend group.   Plan: Continue to engage patient in RT group sessions 2-3x/week.   Jeoffrey BRAVO Celestia, LRT, CTRS 09/17/2024 10:53 AM

## 2024-09-17 NOTE — Progress Notes (Signed)
   09/17/24 0830  Psych Admission Type (Psych Patients Only)  Admission Status Voluntary  Psychosocial Assessment  Patient Complaints Depression  Eye Contact Fair  Facial Expression Animated  Affect Depressed  Speech Logical/coherent  Interaction Assertive  Motor Activity Other (Comment) (WNL)  Appearance/Hygiene In scrubs  Behavior Characteristics Cooperative  Mood Pleasant  Thought Process  Coherency WDL  Content Paranoia  Delusions None reported or observed  Perception WDL  Hallucination None reported or observed  Judgment Limited  Confusion None  Danger to Self  Current suicidal ideation? Denies  Agreement Not to Harm Self Yes  Description of Agreement verbal  Danger to Others  Danger to Others None reported or observed

## 2024-09-17 NOTE — BH IP Treatment Plan (Unsigned)
 Interdisciplinary Treatment and Diagnostic Plan Update  09/17/2024 Time of Session: 12:00pm Rodney Thomas MRN: [id number]  Principal Diagnosis: Schizoaffective disorder, depressive type (HCC)  Secondary Diagnoses: Principal Problem:   Schizoaffective disorder, depressive type (HCC)   Current Medications:  Current Facility-Administered Medications  Medication Dose Route Frequency Provider Last Rate Last Admin   acetaminophen  (TYLENOL ) tablet 650 mg  650 mg Oral Q6H PRN White, Patrice L, NP       alum & mag hydroxide-simeth (MAALOX/MYLANTA) 200-200-20 MG/5ML suspension 30 mL  30 mL Oral Q4H PRN White, Patrice L, NP       benztropine  (COGENTIN ) tablet 0.5 mg  0.5 mg Oral BID PRN Hunter, Crystal L, PA-C       busPIRone  (BUSPAR ) tablet 10 mg  10 mg Oral BID Hunter, Crystal L, PA-C   10 mg at 09/17/24 0831   haloperidol  (HALDOL ) tablet 5 mg  5 mg Oral TID PRN White, Patrice L, NP       And   diphenhydrAMINE  (BENADRYL ) capsule 50 mg  50 mg Oral TID PRN White, Patrice L, NP       haloperidol  lactate (HALDOL ) injection 5 mg  5 mg Intramuscular TID PRN White, Patrice L, NP       And   diphenhydrAMINE  (BENADRYL ) injection 50 mg  50 mg Intramuscular TID PRN White, Patrice L, NP       And   LORazepam  (ATIVAN ) injection 2 mg  2 mg Intramuscular TID PRN White, Patrice L, NP       haloperidol  lactate (HALDOL ) injection 10 mg  10 mg Intramuscular TID PRN White, Patrice L, NP       And   diphenhydrAMINE  (BENADRYL ) injection 50 mg  50 mg Intramuscular TID PRN White, Patrice L, NP       And   LORazepam  (ATIVAN ) injection 2 mg  2 mg Intramuscular TID PRN White, Patrice L, NP       divalproex  (DEPAKOTE  ER) 24 hr tablet 750 mg  750 mg Oral QHS Hunter, Crystal L, PA-C   750 mg at 09/16/24 2119   feeding supplement (ENSURE PLUS HIGH PROTEIN) liquid 237 mL  237 mL Oral TID BM Jadapalle, Sree, MD   237 mL at 09/17/24 9165   hydrOXYzine  (ATARAX ) tablet 25 mg  25 mg Oral TID PRN White, Patrice L, NP        magnesium  hydroxide (MILK OF MAGNESIA) suspension 30 mL  30 mL Oral Daily PRN White, Patrice L, NP   30 mL at 09/17/24 1341   multivitamin with minerals tablet 1 tablet  1 tablet Oral Daily Jadapalle, Sree, MD   1 tablet at 09/17/24 0831   pantoprazole (PROTONIX) EC tablet 20 mg  20 mg Oral Daily Hunter, Crystal L, PA-C   20 mg at 09/17/24 9167   risperiDONE  (RISPERDAL ) tablet 2 mg  2 mg Oral BID Hunter, Crystal L, PA-C   2 mg at 09/17/24 0831   sertraline  (ZOLOFT ) tablet 50 mg  50 mg Oral Daily Hunter, Crystal L, PA-C   50 mg at 09/17/24 0831   traZODone  (DESYREL ) tablet 50 mg  50 mg Oral QHS PRN White, Patrice L, NP       PTA Medications: Medications Prior to Admission  Medication Sig Dispense Refill Last Dose/Taking   benztropine  (COGENTIN ) 0.5 MG tablet Take 1 tablet (0.5 mg total) by mouth every 12 (twelve) hours. (Patient not taking: Reported on 09/13/2024) 60 tablet 1 Not Taking   busPIRone  (BUSPAR ) 10 MG tablet Take 1  tablet (10 mg total) by mouth 3 (three) times daily. (Patient not taking: Reported on 09/13/2024) 90 tablet 1 Not Taking   divalproex  (DEPAKOTE  ER) 500 MG 24 hr tablet Take 2 tablets (1,000 mg total) by mouth at bedtime. (Patient not taking: Reported on 09/13/2024) 60 tablet 1 Not Taking   hydrOXYzine  (ATARAX ) 25 MG tablet Take 1 tablet (25 mg total) by mouth 3 (three) times daily as needed for anxiety. (Patient not taking: Reported on 09/13/2024) 75 tablet 1 Not Taking   risperiDONE  (RISPERDAL ) 3 MG tablet Take 1 tablet (3 mg total) by mouth at bedtime. (Patient not taking: Reported on 09/13/2024)   Not Taking   sertraline  (ZOLOFT ) 50 MG tablet Take 1 tablet (50 mg total) by mouth daily. (Patient not taking: Reported on 09/13/2024) 30 tablet 1 Not Taking    Patient Stressors: Legal issue   Loss of daughter   Occupational concerns   Traumatic event    Patient Strengths: Ability for insight  Average or above average intelligence  Communication skills  Financial means   General fund of knowledge  Motivation for treatment/growth  Physical Health  Supportive family/friends  Work skills   Treatment Modalities: Medication Management, Group therapy, Case management,  1 to 1 session with clinician, Psychoeducation, Recreational therapy.   Physician Treatment Plan for Primary Diagnosis: Schizoaffective disorder, depressive type (HCC) Long Term Goal(s): Improvement in symptoms so as ready for discharge   Short Term Goals: Ability to identify changes in lifestyle to reduce recurrence of condition will improve Ability to verbalize feelings will improve Ability to disclose and discuss suicidal ideas Ability to demonstrate self-control will improve Ability to identify and develop effective coping behaviors will improve Ability to maintain clinical measurements within normal limits will improve Compliance with prescribed medications will improve Ability to identify triggers associated with substance abuse/mental health issues will improve  Medication Management: Evaluate patient's response, side effects, and tolerance of medication regimen.  Therapeutic Interventions: 1 to 1 sessions, Unit Group sessions and Medication administration.  Evaluation of Outcomes: Progressing  Physician Treatment Plan for Secondary Diagnosis: Principal Problem:   Schizoaffective disorder, depressive type (HCC)  Long Term Goal(s): Improvement in symptoms so as ready for discharge   Short Term Goals: Ability to identify changes in lifestyle to reduce recurrence of condition will improve Ability to verbalize feelings will improve Ability to disclose and discuss suicidal ideas Ability to demonstrate self-control will improve Ability to identify and develop effective coping behaviors will improve Ability to maintain clinical measurements within normal limits will improve Compliance with prescribed medications will improve Ability to identify triggers associated with substance  abuse/mental health issues will improve     Medication Management: Evaluate patient's response, side effects, and tolerance of medication regimen.  Therapeutic Interventions: 1 to 1 sessions, Unit Group sessions and Medication administration.  Evaluation of Outcomes: Progressing   RN Treatment Plan for Primary Diagnosis: Schizoaffective disorder, depressive type (HCC) Long Term Goal(s): Knowledge of disease and therapeutic regimen to maintain health will improve  Short Term Goals: Ability to remain free from injury will improve, Ability to verbalize frustration and anger appropriately will improve, Ability to demonstrate self-control, Ability to participate in decision making will improve, Ability to verbalize feelings will improve, Ability to disclose and discuss suicidal ideas, Ability to identify and develop effective coping behaviors will improve, and Compliance with prescribed medications will improve  Medication Management: RN will administer medications as ordered by provider, will assess and evaluate patient's response and provide education to patient for  prescribed medication. RN will report any adverse and/or side effects to prescribing provider.  Therapeutic Interventions: 1 on 1 counseling sessions, Psychoeducation, Medication administration, Evaluate responses to treatment, Monitor vital signs and CBGs as ordered, Perform/monitor CIWA, COWS, AIMS and Fall Risk screenings as ordered, Perform wound care treatments as ordered.  Evaluation of Outcomes: Progressing   LCSW Treatment Plan for Primary Diagnosis: Schizoaffective disorder, depressive type (HCC) Long Term Goal(s): Safe transition to appropriate next level of care at discharge, Engage patient in therapeutic group addressing interpersonal concerns.  Short Term Goals: Engage patient in aftercare planning with referrals and resources, Increase social support, Increase ability to appropriately verbalize feelings, Increase  emotional regulation, Facilitate acceptance of mental health diagnosis and concerns, Facilitate patient progression through stages of change regarding substance use diagnoses and concerns, Identify triggers associated with mental health/substance abuse issues, and Increase skills for wellness and recovery  Therapeutic Interventions: Assess for all discharge needs, 1 to 1 time with Social worker, Explore available resources and support systems, Assess for adequacy in community support network, Educate family and significant other(s) on suicide prevention, Complete Psychosocial Assessment, Interpersonal group therapy.  Evaluation of Outcomes: Progressing   Progress in Treatment: Attending groups: Yes. Participating in groups: Yes. Taking medication as prescribed: Yes. Toleration medication: Yes. Family/Significant other contact made: No, will contact:  Patient declined consent for family contact.  Patient understands diagnosis: Yes. Discussing patient identified problems/goals with staff: Yes. Medical problems stabilized or resolved: Yes. Denies suicidal/homicidal ideation: Yes. Issues/concerns per patient self-inventory: No. Other:   New problem(s) identified: No, Describe:  None. Update: 09/17/2024 No changes at this time.   New Short Term/Long Term Goal(s):detox, elimination of symptoms of psychosis, medication management for mood stabilization; elimination of SI thoughts; development of comprehensive mental wellness/sobriety plan.  Update: 09/17/2024 No changes at this time.   Patient Goals:  Just to get better all the way around. Update: 09/17/2024 No changes at this time.   Discharge Plan or Barriers: CSW to assist with the development of appropriate discharge plan.  Update: 09/17/2024 No changes at this time.   Reason for Continuation of Hospitalization: Anxiety Depression Suicidal ideation   Estimated Length of Stay: 1-7 days. Update: 09/17/2024 TBD  Last 3 [place] Suicide  Severity Risk Score: Flowsheet Row Admission (Current) from 09/10/2024 in [facility] INPATIENT BEH[facility]INE Most recent reading at 09/10/2024 10:12 AM ED from 09/10/2024 in [facility] Most recent reading at 09/10/2024  5:47 AM ED from 09/09/2024 in [facility] Emergency Department at [facility] Most recent reading at 09/10/2024 12:05 AM  C-SSRS RISK CATEGORY Low Risk Low Risk No Risk    Last PHQ 2/9 Scores:    09/10/2024    4:04 AM 03/11/2024   11:34 AM 02/11/2024   11:55 AM  Depression screen PHQ 2/9  Decreased Interest 2 1 1   Down, Depressed, Hopeless 2 1 2   PHQ - 2 Score 4 2 3   Altered sleeping 2 2 1   Tired, decreased energy 0 0 1  Change in appetite 1 1 1   Feeling bad or failure about yourself  2 3 0  Trouble concentrating 2 0 1  Moving slowly or fidgety/restless 0 0 1  Suicidal thoughts 2 1 0  PHQ-9 Score 13 9 8   Difficult doing work/chores Very difficult Somewhat difficult Somewhat difficult    Scribe for Treatment Team: Aldo CHRISTELLA Dorthy KEN 09/17/2024 4:13 PM

## 2024-09-17 NOTE — Group Note (Signed)
 Date:  09/17/2024 Time:  9:14 PM  Group Topic/Focus:  Wrap-Up Group:   The focus of this group is to help patients review their daily goal of treatment and discuss progress on daily workbooks.    Participation Level:  Minimal  Participation Quality:  Attentive and Resistant  Affect:  Appropriate and Flat  Cognitive:  Alert  Insight: Lacking  Engagement in Group:  Limited and None  Modes of Intervention:  Discussion  Additional Comments:     Rodney Thomas,Rodney Thomas 09/17/2024, 9:14 PM

## 2024-09-17 NOTE — Group Note (Signed)
 Date:  09/17/2024 Time:  2:22 AM    Additional Comments:  Did not attend group.  Rodney Thomas 09/17/2024, 2:22 AM

## 2024-09-18 LAB — VALPROIC ACID LEVEL: Valproic Acid Lvl: 68 ug/mL (ref 50–100)

## 2024-09-18 NOTE — Group Note (Signed)
 Date:  09/18/2024 Time:  4:56 PM  Group Topic/Focus:  Wellness Toolbox:   The focus of this group is to discuss various aspects of wellness, balancing those aspects and exploring ways to increase the ability to experience wellness.  Patients will create a wellness toolbox for use upon discharge.    Participation Level:  Active  Participation Quality:  Appropriate  Affect:  Appropriate  Cognitive:  Appropriate  Insight: Appropriate  Engagement in Group:  Engaged  Modes of Intervention:  Activity and Socialization  Additional Comments:    Deitra Caron Mainland 09/18/2024, 4:56 PM

## 2024-09-18 NOTE — Plan of Care (Signed)
   Problem: Education: Goal: Emotional status will improve Outcome: Progressing Goal: Mental status will improve Outcome: Progressing

## 2024-09-18 NOTE — Group Note (Signed)
 Date:  09/18/2024 Time:  9:51 PM  Group Topic/Focus:  Making Healthy Choices:   The focus of this group is to help patients identify negative/unhealthy choices they were using prior to admission and identify positive/healthier coping strategies to replace them upon discharge. Wrap-Up Group:   The focus of this group is to help patients review their daily goal of treatment and discuss progress on daily workbooks.    Participation Level:  Active  Participation Quality:  Appropriate and Attentive  Affect:  Appropriate  Cognitive:  Alert, Appropriate, and Oriented  Insight: Appropriate and Good  Engagement in Group:  Engaged  Modes of Intervention:  Discussion and Support  Additional Comments:  N/A  Rodney Thomas 09/18/2024, 9:51 PM

## 2024-09-18 NOTE — Progress Notes (Signed)
   09/18/24 0835  Psychosocial Assessment  Patient Complaints Depression  Eye Contact Fair  Facial Expression Animated  Affect Depressed  Speech Logical/coherent  Interaction Assertive  Motor Activity Other (Comment) (WNL)  Appearance/Hygiene In scrubs  Behavior Characteristics Cooperative  Mood Depressed;Pleasant  Thought Process  Coherency WDL  Content Paranoia  Delusions None reported or observed  Perception WDL  Hallucination None reported or observed  Judgment Limited  Confusion None  Danger to Self  Current suicidal ideation? Denies  Agreement Not to Harm Self Yes  Description of Agreement verbal  Danger to Others  Danger to Others None reported or observed

## 2024-09-18 NOTE — Group Note (Signed)
 LCSW Group Therapy Note  Group Date: 09/18/2024 Start Time: 1300 End Time: 1400   Type of Therapy and Topic:  Group Therapy: Positive Affirmations  Participation Level:  Active   Description of Group:   This group addressed positive affirmation towards self and others.  Patients went around the room and identified two positive things about themselves and two positive things about a peer in the room.  Patients reflected on how it felt to share something positive with others, to identify positive things about themselves, and to hear positive things from others/ Patients were encouraged to have a daily reflection of positive characteristics or circumstances.   Therapeutic Goals: Patients will verbalize two of their positive qualities Patients will demonstrate empathy for others by stating two positive qualities about a peer in the group Patients will verbalize their feelings when voicing positive self affirmations and when voicing positive affirmations of others Patients will discuss the potential positive impact on their wellness/recovery of focusing on positive traits of self and others.  Summary of Patient Progress:  Patient actively engaged in the discussion and . Patient was able to identify positive affirmations about themself as well as other group members. Patient demonstrated proficient insight into the subject matter, was respectful of peers, participated throughout the entire session.  Therapeutic Modalities:   Cognitive Behavioral Therapy Motivational Interviewing    Alveta CHRISTELLA Kerns, ISRAEL 09/18/2024  2:56 PM

## 2024-09-18 NOTE — Plan of Care (Signed)

## 2024-09-18 NOTE — Group Note (Signed)
 Date:  09/18/2024 Time:  10:34 AM  Group Topic/Focus:  Relapse Prevention Planning:   The focus of this group is to define relapse and discuss the need for planning to combat relapse.    Participation Level:  Active  Participation Quality:  Appropriate  Affect:  Appropriate  Cognitive:  Appropriate  Insight: Appropriate  Engagement in Group:  Engaged  Modes of Intervention:  Activity  Additional Comments:    Rodney Thomas 09/18/2024, 10:34 AM

## 2024-09-18 NOTE — Progress Notes (Signed)
   09/17/24 2100  Psych Admission Type (Psych Patients Only)  Admission Status Voluntary  Psychosocial Assessment  Patient Complaints Depression  Eye Contact Fair  Facial Expression Animated  Affect Depressed  Speech Logical/coherent  Interaction Assertive  Motor Activity Other (Comment) (WDL)  Appearance/Hygiene Unremarkable  Behavior Characteristics Appropriate to situation;Cooperative  Mood Depressed  Thought Process  Coherency WDL  Content Paranoia  Delusions None reported or observed  Perception WDL  Hallucination None reported or observed  Judgment Limited  Confusion None  Danger to Self  Current suicidal ideation? Denies  Agreement Not to Harm Self Yes  Description of Agreement Verbal  Danger to Others  Danger to Others None reported or observed

## 2024-09-18 NOTE — Progress Notes (Signed)
 [facility] MD Progress Note  09/18/2024 3:08 PM Rodney Thomas  MRN:  [id number]   Subjective:  Chart reviewed, case discussed in multidisciplinary meeting, patient seen during rounds.   Upon assessment, patient is sleeping in room. Patient reports that he is doing good. He expressed joy in going home tomorrow. He does identify his mother and his relationship is a stressor as she at times will kick him out if he doesn't give her the money she wants. He reports looking into income based housing but they are not always in the best areas. He will continue his search once he is out of the hospital. He identifies he is welcome to go back to his mother's house, just knows it would be better for him to live separate. He is excited to get back to his dog.   Patient denies any concerns/complaints. Denies side effects from his medications. Denies SI, HI, and AVH. He has been active in the milieu and in groups.   10/25: Upon interview, patient was pleasant and cooperative. Patient expressed excitement to go home. He acknowledges the plan for discharge on Monday. He reports that he is doing much better. Reports continued intermittent blurred vision, but reports this has been occurring for about 6 months and he believes he needs to see an eye doctor to get his vision checked. Denies that this is causing him distress or increasing in frequency.   Nursing reports patient did appear to have some concern over color of medicine and paranoia that it was the correct medication. Patient was able to discuss color differences In medication along with what medications he is taking with nursing.   Patient denies SI, HI, and AVH. Denies concerns/complaints.   Sleep: Good  Appetite:  Good  Past Psychiatric History: see h&P Family History: History reviewed. No pertinent family history. Social History:  Social History   Substance and Sexual Activity  Alcohol Use No     Social History   Substance and Sexual Activity   Drug Use No    Social History   Socioeconomic History   Marital status: Single    Spouse name: Not on file   Number of children: Not on file   Years of education: Not on file   Highest education level: Not on file  Occupational History   Not on file  Tobacco Use   Smoking status: Never   Smokeless tobacco: Never  Vaping Use   Vaping status: Never Used  Substance and Sexual Activity   Alcohol use: No   Drug use: No   Sexual activity: Not Currently  Other Topics Concern   Not on file  Social History Narrative   Not on file   Social Drivers of Health   Financial Resource Strain: Not on file  Food Insecurity: Food Insecurity Present (09/10/2024)   Hunger Vital Sign    Worried About Running Out of Food in the Last Year: Sometimes true    Ran Out of Food in the Last Year: Sometimes true  Transportation Needs: No Transportation Needs (09/10/2024)   PRAPARE - Administrator, Civil Service (Medical): No    Lack of Transportation (Non-Medical): No  Physical Activity: Not on file  Stress: Not on file  Social Connections: Not on file   Past Medical History:  Past Medical History:  Diagnosis Date   Chronic insomnia 01/16/2024   Obsessive-compulsive behavior 01/16/2024   Schizoaffective disorder, bipolar type (HCC) 01/16/2024   Seasonal allergies    Vitamin D  deficiency 01/17/2024  Past Surgical History:  Procedure Laterality Date   APPENDECTOMY     ORIF RADIAL FRACTURE Left 03/17/2014   Procedure: OPEN REDUCTION INTERNAL FIXATION (ORIF) RADIAL FRACTURE;  Surgeon: Oneil JAYSON Herald, MD;  Location: [facility] OR;  Service: Orthopedics;  Laterality: Left;  Open Reduction Internal Fixation Left Radial Shaft Fracture    Current Medications: Current Facility-Administered Medications  Medication Dose Route Frequency Provider Last Rate Last Admin   acetaminophen  (TYLENOL ) tablet 650 mg  650 mg Oral Q6H PRN White, Patrice L, NP       alum & mag hydroxide-simeth  (MAALOX/MYLANTA) 200-200-20 MG/5ML suspension 30 mL  30 mL Oral Q4H PRN White, Patrice L, NP       benztropine  (COGENTIN ) tablet 0.5 mg  0.5 mg Oral BID PRN Hunter, Crystal L, PA-C       busPIRone  (BUSPAR ) tablet 10 mg  10 mg Oral BID Hunter, Crystal L, PA-C   10 mg at 09/18/24 9167   haloperidol  (HALDOL ) tablet 5 mg  5 mg Oral TID PRN White, Patrice L, NP       And   diphenhydrAMINE  (BENADRYL ) capsule 50 mg  50 mg Oral TID PRN White, Patrice L, NP       haloperidol  lactate (HALDOL ) injection 5 mg  5 mg Intramuscular TID PRN White, Patrice L, NP       And   diphenhydrAMINE  (BENADRYL ) injection 50 mg  50 mg Intramuscular TID PRN White, Patrice L, NP       And   LORazepam  (ATIVAN ) injection 2 mg  2 mg Intramuscular TID PRN White, Patrice L, NP       haloperidol  lactate (HALDOL ) injection 10 mg  10 mg Intramuscular TID PRN White, Patrice L, NP       And   diphenhydrAMINE  (BENADRYL ) injection 50 mg  50 mg Intramuscular TID PRN White, Patrice L, NP       And   LORazepam  (ATIVAN ) injection 2 mg  2 mg Intramuscular TID PRN White, Patrice L, NP       divalproex  (DEPAKOTE  ER) 24 hr tablet 750 mg  750 mg Oral QHS Hunter, Crystal L, PA-C   750 mg at 09/17/24 2108   feeding supplement (ENSURE PLUS HIGH PROTEIN) liquid 237 mL  237 mL Oral TID BM Jadapalle, Sree, MD   237 mL at 09/17/24 2106   hydrOXYzine  (ATARAX ) tablet 25 mg  25 mg Oral TID PRN White, Patrice L, NP       magnesium  hydroxide (MILK OF MAGNESIA) suspension 30 mL  30 mL Oral Daily PRN White, Patrice L, NP   30 mL at 09/17/24 1341   multivitamin with minerals tablet 1 tablet  1 tablet Oral Daily Jadapalle, Sree, MD   1 tablet at 09/18/24 0832   pantoprazole (PROTONIX) EC tablet 20 mg  20 mg Oral Daily Hunter, Crystal L, PA-C   20 mg at 09/18/24 9166   risperiDONE  (RISPERDAL ) tablet 2 mg  2 mg Oral BID Hunter, Crystal L, PA-C   2 mg at 09/18/24 9167   sertraline  (ZOLOFT ) tablet 50 mg  50 mg Oral Daily Hunter, Crystal L, PA-C   50 mg at 09/18/24  0833   traZODone  (DESYREL ) tablet 50 mg  50 mg Oral QHS PRN White, Patrice L, NP        Lab Results:  Results for orders placed or performed during the hospital encounter of 09/10/24 (from the past 48 hours)  Valproic acid  level     Status: None  Collection Time: 09/18/24  8:12 AM  Result Value Ref Range   Valproic Acid  Lvl 68 50 - 100 ug/mL    Comment: Performed at [facility], [address] [place], [place] [address]    Blood Alcohol level:  Lab Results  Component Value Date   [facility] <15 09/10/2024   ETH <10 02/08/2024    Metabolic Disorder Labs: Lab Results  Component Value Date   HGBA1C 5.2 09/10/2024   MPG 102.54 09/10/2024   MPG 108.28 01/16/2024   No results found for: PROLACTIN Lab Results  Component Value Date   CHOL 290 (H) 09/10/2024   TRIG 39 09/10/2024   HDL 61 09/10/2024   CHOLHDL 4.8 09/10/2024   VLDL 8 09/10/2024   LDLCALC 221 (H) 09/10/2024   LDLCALC 72 01/16/2024     Psychiatric Specialty Exam:  Presentation  General Appearance:  Appropriate for Environment  Eye Contact: Fair  Speech: Clear and Coherent; Normal Rate  Speech Volume: Normal    Mood and Affect  Mood: Euthymic  Affect: Appropriate   Thought Process  Thought Processes: Coherent; Goal Directed; Linear  Orientation:Full (Time, Place and Person)  Thought Content:Logical; WDL  Hallucinations:Hallucinations: None  Ideas of Reference:None  Suicidal Thoughts:Suicidal Thoughts: No  Homicidal Thoughts:Homicidal Thoughts: No   Sensorium  Memory: Immediate Fair; Recent Fair; Remote Fair  Judgment: Fair  Insight: Fair   Art Therapist  Concentration: Fair  Attention Span: Fair  Recall: [employer] of Knowledge: Fair  Language: Fair   Psychomotor Activity  Psychomotor Activity: Psychomotor Activity: Normal  Musculoskeletal: Strength & Muscle Tone: within normal limits Gait & Station: normal Assets   Assets: Manufacturing Systems Engineer; Desire for Improvement    Physical Exam: Physical Exam Vitals and nursing note reviewed.  Constitutional:      Appearance: Normal appearance.  Cardiovascular:     Rate and Rhythm: Normal rate.  Pulmonary:     Effort: Pulmonary effort is normal.  Neurological:     Mental Status: He is alert and oriented to person, place, and time.  Psychiatric:        Mood and Affect: Mood normal.        Behavior: Behavior normal.    Review of Systems  Respiratory:  Negative for shortness of breath.   Cardiovascular:  Negative for chest pain.  Gastrointestinal:  Negative for nausea and vomiting.  Psychiatric/Behavioral:  Negative for hallucinations and suicidal ideas.   All other systems reviewed and are negative.  Blood pressure 134/86, pulse (!) 107, temperature 98.2 F (36.8 C), temperature source Oral, resp. rate 17, height 5' 10 (1.778 m), weight 72.6 kg, SpO2 98%. Body mass index is 22.96 kg/m.  Diagnosis: Principal Problem:   Schizoaffective disorder, depressive type (HCC)   PLAN: Safety and Monitoring:  -- Voluntary admission to inpatient psychiatric unit for safety, stabilization and treatment  -- Daily contact with patient to assess and evaluate symptoms and progress in treatment  -- Patient's case to be discussed in multi-disciplinary team meeting  -- Observation Level : q15 minute checks  -- Vital signs:  q12 hours  -- Precautions: suicide, elopement, and assault -- Encouraged patient to participate in unit milieu and in scheduled group therapies  2. Psychiatric Treatment:  Scheduled Medications:  Risperidone  2 mg twice daily BuSpar  10 mg twice daily Depakote  ER 750 mg at bedtime Cogentin  0.5 mg twice daily as needed Zoloft  50 mg once daily  Valproic acid  09/18/24 was 68, WNL   -- The risks/benefits/side-effects/alternatives to this medication were  discussed in detail with the patient and time was given for questions. The patient  consents to medication trial.   3. Medical Issues Being Addressed:  Protonix for GERD Milk of magnesia as needed for constipation   4. Discharge Planning:   -- Social work and case management to assist with discharge planning and identification of hospital follow-up needs prior to discharge  -- Estimated LOS: 5-7 days (estimated discharge of Monday 09/19/24)   Glenys Emonee Winkowski, NP 09/18/2024, 3:08 PM

## 2024-09-19 ENCOUNTER — Other Ambulatory Visit (HOSPITAL_BASED_OUTPATIENT_CLINIC_OR_DEPARTMENT_OTHER): Payer: Self-pay

## 2024-09-19 ENCOUNTER — Other Ambulatory Visit: Payer: Self-pay

## 2024-09-19 ENCOUNTER — Other Ambulatory Visit (HOSPITAL_COMMUNITY): Payer: Self-pay

## 2024-09-19 MED ORDER — DIVALPROEX SODIUM ER 250 MG PO TB24
750.0000 mg | ORAL_TABLET | Freq: Every day | ORAL | 0 refills | Status: DC
  Filled 2024-09-19: qty 90, 30d supply, fill #0

## 2024-09-19 MED ORDER — BUSPIRONE HCL 10 MG PO TABS
10.0000 mg | ORAL_TABLET | Freq: Two times a day (BID) | ORAL | 0 refills | Status: DC
  Filled 2024-09-19: qty 60, 30d supply, fill #0

## 2024-09-19 MED ORDER — PANTOPRAZOLE SODIUM 20 MG PO TBEC
20.0000 mg | DELAYED_RELEASE_TABLET | Freq: Every day | ORAL | 0 refills | Status: AC
  Filled 2024-09-19: qty 30, 30d supply, fill #0

## 2024-09-19 MED ORDER — ADULT MULTIVITAMIN W/MINERALS CH
1.0000 | ORAL_TABLET | Freq: Every day | ORAL | 0 refills | Status: AC
  Filled 2024-09-19: qty 30, 30d supply, fill #0

## 2024-09-19 MED ORDER — BENZTROPINE MESYLATE 0.5 MG PO TABS
0.5000 mg | ORAL_TABLET | Freq: Two times a day (BID) | ORAL | 0 refills | Status: AC | PRN
  Filled 2024-09-19: qty 15, 8d supply, fill #0

## 2024-09-19 MED ORDER — RISPERIDONE 2 MG PO TABS
2.0000 mg | ORAL_TABLET | Freq: Two times a day (BID) | ORAL | 0 refills | Status: DC
  Filled 2024-09-19: qty 60, 30d supply, fill #0

## 2024-09-19 MED ORDER — SERTRALINE HCL 50 MG PO TABS
50.0000 mg | ORAL_TABLET | Freq: Every day | ORAL | 0 refills | Status: DC
  Filled 2024-09-19: qty 30, 30d supply, fill #0

## 2024-09-19 NOTE — Progress Notes (Signed)
 Patient denies SI/I/AVH at this time. Discharge instructions, AVS, prescriptions, and transition record reviewed with patient. Patient agrees to comply with medication management, follow-up visit and outpatient therapy. Patient belongings returned to patient. Patient questions and concerns addressed and answered.  Patient ambulatory off unit. Patient discharged home via taxi.

## 2024-09-19 NOTE — Plan of Care (Signed)

## 2024-09-19 NOTE — Group Note (Signed)
 Recreation Therapy Group Note   Group Topic:Healthy Support Systems  Group Date: 09/19/2024 Start Time: 1030 End Time: 1130 Facilitators: Celestia Jeoffrey BRAVO, LRT, CTRS Location: Craft Room  Group Description: Straw Bridge. In groups or individually, patients were given 10 plastic drinking straws and an equal length of masking tape. Using the materials provided, patients were instructed to build a free-standing bridge-like structure to suspend an everyday item (ex: deck of cards) off the floor or table surface. All materials were required to be used in secondary school teacher. LRT facilitated post-activity discussion reviewing the importance of having strong and healthy support systems in our lives. LRT discussed how the people in our lives serve as the tape and the deck of cards we placed on top of our straw structure are the stressors we face in daily life. LRT and pts discussed what happens in our life when things get too heavy for us , and we don't have strong supports outside of the hospital. Pt shared 2 of their healthy supports in their life aloud in the group.   Goal Area(s) Addressed:  Patient will identify 2 healthy supports in their life. Patient will identify skills to successfully complete activity. Patient will identify correlation of this activity to life post-discharge.  Patient will build on frustration tolerance skills. Patient will increase team building and communication skills.    Affect/Mood: N/A   Participation Level: Did not attend    Clinical Observations/Individualized Feedback: Patient did not attend group.   Plan: Continue to engage patient in RT group sessions 2-3x/week.   Jeoffrey BRAVO Celestia, LRT, CTRS 09/19/2024 1:11 PM

## 2024-09-19 NOTE — Discharge Summary (Signed)
 Physician Discharge Summary Note  Patient:  Rodney Thomas is an 33 y.o., male MRN:  [id number] DOB:  1991/07/29 Patient phone:  [phone] (home)  Patient address:   [address] [place] [address],   Total time spent: 40 min Date of Admission:  09/10/2024 Date of Discharge: 09/19/24  Reason for Admission:  Rodney Thomas is a 33 year-old male who presents to [facility] as a voluntary walk-in, accompanied by safe transport with complaint of suicidal ideations without plan/intent. He reports that he was seen at [facility] for suicidal ideations and has been struggling with ongoing suicidal thoughts since the loss of his daughter a couple of months ago. Rodney Thomas reports diagnosis of schizoaffective disorder. Reports he was previously taking medications, but stopped due to unwanted side effects. Patient is admitted to adult psych unit with Q15 min safety monitoring. Multidisciplinary team approach is offered. Medication management; group/milieu therapy is offered.   Principal Problem: Schizoaffective disorder, depressive type [facility]) Discharge Diagnoses: Principal Problem:   Schizoaffective disorder, depressive type (HCC)   Past Psychiatric History: see h&p  Family Psychiatric  History: see h&p Social History:  Social History   Substance and Sexual Activity  Alcohol Use No     Social History   Substance and Sexual Activity  Drug Use No    Social History   Socioeconomic History   Marital status: Single    Spouse name: Not on file   Number of children: Not on file   Years of education: Not on file   Highest education level: Not on file  Occupational History   Not on file  Tobacco Use   Smoking status: Never   Smokeless tobacco: Never  Vaping Use   Vaping status: Never Used  Substance and Sexual Activity   Alcohol use: No   Drug use: No   Sexual activity: Not Currently  Other Topics Concern   Not on file  Social History Narrative   Not on file   Social Drivers of  Health   Financial Resource Strain: Not on file  Food Insecurity: Food Insecurity Present (09/10/2024)   Hunger Vital Sign    Worried About Running Out of Food in the Last Year: Sometimes true    Ran Out of Food in the Last Year: Sometimes true  Transportation Needs: No Transportation Needs (09/10/2024)   PRAPARE - Administrator, Civil Service (Medical): No    Lack of Transportation (Non-Medical): No  Physical Activity: Not on file  Stress: Not on file  Social Connections: Not on file   Past Medical History:  Past Medical History:  Diagnosis Date   Chronic insomnia 01/16/2024   Obsessive-compulsive behavior 01/16/2024   Schizoaffective disorder, bipolar type (HCC) 01/16/2024   Seasonal allergies    Vitamin D  deficiency 01/17/2024    Past Surgical History:  Procedure Laterality Date   APPENDECTOMY     ORIF RADIAL FRACTURE Left 03/17/2014   Procedure: OPEN REDUCTION INTERNAL FIXATION (ORIF) RADIAL FRACTURE;  Surgeon: Oneil JAYSON Herald, MD;  Location: [facility] OR;  Service: Orthopedics;  Laterality: Left;  Open Reduction Internal Fixation Left Radial Shaft Fracture   Family History: History reviewed. No pertinent family history.  Hospital Course:  Rodney Thomas is a 33 year-old male who presents to [facility] as a voluntary walk-in, accompanied by safe transport with complaint of suicidal ideations without plan/intent. He reports that he was seen at [facility] for suicidal ideations and has been struggling with ongoing suicidal thoughts since the loss of his daughter a couple  of months ago. Rodney Thomas reports diagnosis of schizoaffective disorder. Reports he was previously taking medications, but stopped due to unwanted side effects. Patient is admitted to adult psych unit with Q15 min safety monitoring. Multidisciplinary team approach is offered. Medication management; group/milieu therapy is offered.  Detailed risk assessment is complete based on clinical exam and individual risk factors and acute  suicide risk is low and acute violence risk is low.   On admission patient was continued on some of his home medications but dosages were optimized Risperdal  2 mg twice daily, Zoloft  50 mg daily for depression and anxiety, Depakote  at 750 mg for mood stabilization, BuSpar  10 mg twice daily to help with anxiety.  Patient tolerated medications with no reported side effects.  Patient participated in groups.  On the day of discharge patient consistently denied SI/HI/plan and denies hallucinations.  Patient remains future oriented and is willing to participate in outpatient mental health services. Currently, all modifiable risk of harm to self/harm to others have been addressed and patient is no longer appropriate for the acute inpatient setting and is able to continue treatment for mental health needs in the community with the supports as indicated below.  Patient is educated and verbalized understanding of discharge plan of care including medications, follow-up appointments, mental health resources and further crisis services in the community.  He is instructed to call 911 or present to the nearest emergency room should he experience any decompensation in mood, disturbance of bowel or return of suicidal/homicidal ideations.  Patient verbalizes understanding of this education and agrees to this plan of care  Physical Findings: AIMS:  , ,  ,  ,    CIWA:    COWS:        Psychiatric Specialty Exam:  Presentation  General Appearance:  Appropriate for Environment; Casual  Eye Contact: Fair  Speech: Clear and Coherent  Speech Volume: Normal    Mood and Affect  Mood: Euthymic  Affect: Appropriate   Thought Process  Thought Processes: Coherent  Descriptions of Associations:Intact  Orientation:Full (Time, Place and Person)  Thought Content:Logical  Hallucinations:Hallucinations: None  Ideas of Reference:None  Suicidal Thoughts:Suicidal Thoughts: No  Homicidal  Thoughts:Homicidal Thoughts: No   Sensorium  Memory: Immediate Fair; Recent Fair; Remote Fair  Judgment: Fair  Insight: Fair   Art Therapist  Concentration: Fair  Attention Span: Fair  Recall: [employer] of Knowledge: Fair  Language: Fair   Psychomotor Activity  Psychomotor Activity: Psychomotor Activity: Normal; Restlessness  Musculoskeletal: Strength & Muscle Tone: within normal limits Gait & Station: normal Assets  Assets: Manufacturing Systems Engineer; Desire for Improvement; Resilience   Sleep  Sleep: Sleep: Fair    Physical Exam: Physical Exam Vitals and nursing note reviewed.    ROS Blood pressure 126/76, pulse (!) 111, temperature 98.4 F (36.9 C), temperature source Oral, resp. rate 19, height 5' 10 (1.778 m), weight 72.6 kg, SpO2 98%. Body mass index is 22.96 kg/m.   Social History   Tobacco Use  Smoking Status Never  Smokeless Tobacco Never   Tobacco Cessation:  N/A, patient does not currently use tobacco products   Blood Alcohol level:  Lab Results  Component Value Date   [facility] <15 09/10/2024   ETH <10 02/08/2024    Metabolic Disorder Labs:  Lab Results  Component Value Date   HGBA1C 5.2 09/10/2024   MPG 102.54 09/10/2024   MPG 108.28 01/16/2024   No results found for: PROLACTIN Lab Results  Component Value Date   CHOL 290 (H)  09/10/2024   TRIG 39 09/10/2024   HDL 61 09/10/2024   CHOLHDL 4.8 09/10/2024   VLDL 8 09/10/2024   LDLCALC 221 (H) 09/10/2024   LDLCALC 72 01/16/2024    See Psychiatric Specialty Exam and Suicide Risk Assessment completed by Attending Physician prior to discharge.  Discharge destination:  Home  Is patient on multiple antipsychotic therapies at discharge:  No   Has Patient had three or more failed trials of antipsychotic monotherapy by history:  No  Recommended Plan for Multiple Antipsychotic Therapies: NA   Allergies as of 09/19/2024   No Known Allergies      Medication List      STOP taking these medications    hydrOXYzine  25 MG tablet Commonly known as: ATARAX        TAKE these medications      Indication  benztropine  0.5 MG tablet Commonly known as: COGENTIN  Take 1 tablet (0.5 mg total) by mouth 2 (two) times daily as needed for tremors. What changed:  when to take this reasons to take this  Indication: Extrapyramidal Reaction caused by Medications   busPIRone  10 MG tablet Commonly known as: BUSPAR  Take 1 tablet (10 mg total) by mouth 2 (two) times daily. What changed: when to take this  Indication: Anxiety Disorder   divalproex  250 MG 24 hr tablet Commonly known as: DEPAKOTE  ER Take 3 tablets (750 mg total) by mouth at bedtime. What changed:  medication strength how much to take  Indication: Depressive Phase of Manic-Depression   multivitamin with minerals Tabs tablet Take 1 tablet by mouth daily. Start taking on: September 20, 2024  Indication: Weight Loss   pantoprazole 20 MG tablet Commonly known as: PROTONIX Take 1 tablet (20 mg total) by mouth daily. Start taking on: September 20, 2024  Indication: Heartburn   risperiDONE  2 MG tablet Commonly known as: RISPERDAL  Take 1 tablet (2 mg total) by mouth 2 (two) times daily. What changed:  medication strength how much to take when to take this  Indication: Major Depressive Disorder   sertraline  50 MG tablet Commonly known as: ZOLOFT  Take 1 tablet (50 mg total) by mouth daily.  Indication: Major Depressive Disorder        Follow-up Information     [facility] Follow up.   Why: Appoitment is scheduled 09/27/24 at 9 am.  Appointment is face to face. Contact information: [address]  [address] [place] [address] [phone]                 Follow-up recommendations:  Activity:  As tolerated    Signed: Amire Leazer, MD 09/19/2024, 11:32 AM

## 2024-09-19 NOTE — Plan of Care (Signed)
   Problem: Education: Goal: Knowledge of Rodney Thomas General Education information/materials will improve Outcome: Progressing Goal: Emotional status will improve Outcome: Progressing Goal: Mental status will improve Outcome: Progressing Goal: Verbalization of understanding the information provided will improve Outcome: Progressing   Problem: Activity: Goal: Interest or engagement in activities will improve Outcome: Progressing Goal: Sleeping patterns will improve Outcome: Progressing   Problem: Coping: Goal: Ability to verbalize frustrations and anger appropriately will improve Outcome: Progressing Goal: Ability to demonstrate self-control will improve Outcome: Progressing

## 2024-09-19 NOTE — Group Note (Signed)
 Date:  09/19/2024 Time:  10:38 AM  Group Topic/Focus:  Healthy Communication:   The focus of this group is to discuss communication, barriers to communication, as well as healthy ways to communicate with others.    Participation Level:  Did Not Attend   Rodney Thomas 09/19/2024, 10:38 AM

## 2024-09-19 NOTE — Progress Notes (Addendum)
 Patient presents flat and preoccupied in thoughts. Continues to endorsed depression rated moderately severe.  When offer medication will inquire about dosage and medication type.  Between 2200 hours and 0035, Pt noted to be awake, pacing in his room and RIS - talking to self. At about 0100 Pt OOB at nursing station requesting water.  He reports that he is too hot and believes it is because of the medication he is taking.   09/18/24 2100  Psych Admission Type (Psych Patients Only)  Admission Status Voluntary  Psychosocial Assessment  Patient Complaints None  Eye Contact Fair  Facial Expression Flat  Affect Depressed  Speech Logical/coherent  Interaction Assertive  Motor Activity Other (Comment) (WDL)  Appearance/Hygiene Unremarkable;In scrubs  Behavior Characteristics Appropriate to situation;Cooperative  Mood Depressed  Thought Process  Coherency WDL  Content Paranoia  Delusions None reported or observed  Perception WDL  Hallucination None reported or observed  Judgment Limited  Confusion None  Danger to Self  Current suicidal ideation? Denies  Agreement Not to Harm Self Yes  Description of Agreement Verbal  Danger to Others  Danger to Others None reported or observed

## 2024-09-19 NOTE — Progress Notes (Signed)
  [facility] Adult Case Management Discharge Plan :  Will you be returning to the same living situation after discharge:  Yes,  pt reports that he is returning home.  At discharge, do you have transportation home?: Yes,  CSW to assist with transportation needs. Do you have the ability to pay for your medications: No.  Release of information consent forms completed and in the chart;  Patient's signature needed at discharge.  Patient to Follow up at:  Follow-up Information     [facility] Follow up.   Why: Appoitment is scheduled 09/27/24 at 9 am.  Appointment is face to face. Contact information: [address]  [address] [place] [address] [phone]                 Next level of care provider has access to [facility] Link:no  Safety Planning and Suicide Prevention discussed: Yes,  SPE completed with the patient, patient declined collateral contact.      Has patient been referred to the Quitline?: Patient does not use tobacco/nicotine products  Patient has been referred for addiction treatment: No known substance use disorder.  Sherryle JINNY Margo, LCSW 09/19/2024, 9:48 AM

## 2024-09-19 NOTE — BHH Suicide Risk Assessment (Signed)
 [facility] Discharge Suicide Risk Assessment   Principal Problem: Schizoaffective disorder, depressive type (HCC) Discharge Diagnoses: Principal Problem:   Schizoaffective disorder, depressive type (HCC)   Total Time spent with patient: 30 minutes  Musculoskeletal: Strength & Muscle Tone: within normal limits Gait & Station: normal Patient leans: N/A  Psychiatric Specialty Exam  Presentation  General Appearance:  Appropriate for Environment; Casual  Eye Contact: Fair  Speech: Clear and Coherent  Speech Volume: Normal  Handedness: Right   Mood and Affect  Mood: Euthymic  Duration of Depression Symptoms: Greater than two weeks  Affect: Appropriate   Thought Process  Thought Processes: Coherent  Descriptions of Associations:Intact  Orientation:Full (Time, Place and Person)  Thought Content:Logical  History of Schizophrenia/Schizoaffective disorder:No  Duration of Psychotic Symptoms:Greater than six months  Hallucinations:Hallucinations: None  Ideas of Reference:None  Suicidal Thoughts:Suicidal Thoughts: No  Homicidal Thoughts:Homicidal Thoughts: No   Sensorium  Memory: Immediate Fair; Recent Fair; Remote Fair  Judgment: Fair  Insight: Fair   Art Therapist  Concentration: Fair  Attention Span: Fair  Recall: [employer] of Knowledge: Fair  Language: Fair   Psychomotor Activity  Psychomotor Activity: Psychomotor Activity: Normal; Restlessness   Assets  Assets: Communication Skills; Desire for Improvement; Resilience   Sleep  Sleep: Sleep: Fair  Estimated Sleeping Duration (Last 24 Hours): 6.00-7.50 hours  Physical Exam: Physical Exam ROS Blood pressure 126/76, pulse (!) 111, temperature 98.4 F (36.9 C), temperature source Oral, resp. rate 19, height 5' 10 (1.778 m), weight 72.6 kg, SpO2 98%. Body mass index is 22.96 kg/m.  Mental Status Per Nursing Assessment::   On Admission:  Self-harm  thoughts  Demographic Factors:  Low socioeconomic status  Loss Factors: Decrease in vocational status  Historical Factors: Impulsivity  Risk Reduction Factors:   Living with another person, especially a relative, Positive social support, Positive therapeutic relationship, and Positive coping skills or problem solving skills  Continued Clinical Symptoms:  Depression:   Impulsivity  Cognitive Features That Contribute To Risk:  None    Suicide Risk:  Minimal: No identifiable suicidal ideation.  Patients presenting with no risk factors but with morbid ruminations; may be classified as minimal risk based on the severity of the depressive symptoms   Follow-up Information     [facility] Follow up.   Why: Appoitment is scheduled 09/27/24 at 9 am.  Appointment is face to face. Contact information: [address]  [address] [place] [address] [phone]                 Plan Of Care/Follow-up recommendations:  Activity:  As tolerated  Allyn Foil, MD 09/19/2024, 11:32 AM

## 2024-09-19 NOTE — Progress Notes (Signed)
   09/19/24 1100  Psych Admission Type (Psych Patients Only)  Admission Status Voluntary  Psychosocial Assessment  Patient Complaints Depression  Eye Contact Fair  Facial Expression Animated  Affect Blunted  Speech Logical/coherent  Interaction Assertive  Motor Activity Other (Comment) (appropriate for developmental age)  Appearance/Hygiene Unremarkable;In scrubs  Behavior Characteristics Cooperative  Mood Pleasant  Thought Process  Coherency WDL  Content Paranoia  Delusions None reported or observed  Perception WDL  Hallucination None reported or observed  Judgment Limited  Confusion None  Danger to Self  Current suicidal ideation? Denies  Self-Injurious Behavior No self-injurious ideation or behavior indicators observed or expressed   Danger to Others  Danger to Others None reported or observed

## 2024-09-22 ENCOUNTER — Emergency Department (HOSPITAL_COMMUNITY): Payer: Self-pay

## 2024-09-22 ENCOUNTER — Emergency Department (HOSPITAL_COMMUNITY)
Admission: EM | Admit: 2024-09-22 | Discharge: 2024-09-23 | Disposition: A | Discharge status: Home / Self Care | Payer: Self-pay | Attending: Emergency Medicine | Admitting: Emergency Medicine

## 2024-09-22 ENCOUNTER — Other Ambulatory Visit: Payer: Self-pay

## 2024-09-22 ENCOUNTER — Encounter (HOSPITAL_COMMUNITY): Payer: Self-pay

## 2024-09-22 DIAGNOSIS — K5900 Constipation, unspecified: Secondary | ICD-10-CM | POA: Insufficient documentation

## 2024-09-22 LAB — BASIC METABOLIC PANEL WITH GFR
Anion gap: 13 (ref 5–15)
BUN: 13 mg/dL (ref 6–20)
CO2: 22 mmol/L (ref 22–32)
Calcium: 8.9 mg/dL (ref 8.9–10.3)
Chloride: 104 mmol/L (ref 98–111)
Creatinine, Ser: 0.99 mg/dL (ref 0.61–1.24)
GFR, Estimated: >60 mL/min (ref >60)
Glucose, Bld: 117 mg/dL — ABNORMAL HIGH (ref 70–99)
Potassium: 4.2 mmol/L (ref 3.5–5.1)
Sodium: 139 mmol/L (ref 135–145)

## 2024-09-22 LAB — CBC WITH DIFFERENTIAL/PLATELET
Abs Immature Granulocytes: 0.07 K/uL (ref 0.00–0.07)
Basophils Absolute: 0.1 K/uL (ref 0.0–0.1)
Basophils Relative: 1 %
Eosinophils Absolute: 0.1 K/uL (ref 0.0–0.5)
Eosinophils Relative: 2 %
HCT: 42.7 % (ref 39.0–52.0)
Hemoglobin: 14.3 g/dL (ref 13.0–17.0)
Immature Granulocytes: 1 %
Lymphocytes Relative: 28 %
Lymphs Abs: 2.1 K/uL (ref 0.7–4.0)
MCH: 28.3 pg (ref 26.0–34.0)
MCHC: 33.5 g/dL (ref 30.0–36.0)
MCV: 84.4 fL (ref 80.0–100.0)
Monocytes Absolute: 0.6 K/uL (ref 0.1–1.0)
Monocytes Relative: 8 %
Neutro Abs: 4.4 K/uL (ref 1.7–7.7)
Neutrophils Relative %: 60 %
Platelets: 209 K/uL (ref 150–400)
RBC: 5.06 MIL/uL (ref 4.22–5.81)
RDW: 15 % (ref 11.5–15.5)
WBC: 7.4 K/uL (ref 4.0–10.5)
nRBC: 0 % (ref 0.0–0.2)

## 2024-09-22 NOTE — ED Provider Triage Note (Signed)
 Emergency Medicine Provider Triage Evaluation Note  Rodney Thomas , a 33 y.o. male  was evaluated in triage.  Pt complains of patient was recently released from behavioral health approximately 3 days ago and advises last 3 days he has not been able to use the bathroom.  He reports he feels the urge but is constipated.  Patient has not tried anything at home  Review of Systems  Positive: Constipation Negative: Abdominal pain, diarrhea, nausea, vomiting, chest pain, shortness of breath  Physical Exam  Ht 5' 10 (1.778 m)   Wt 72.6 kg   BMI 22.96 kg/m  Gen:   Awake, no distress   Resp:  Normal effort  MSK:   Moves extremities without difficulty  Other:  No pain to palpation on the abdomen  Medical Decision Making  Medically screening exam initiated at 10:11 PM.  Appropriate orders placed.  Rodney Thomas was informed that the remainder of the evaluation will be completed by another provider, this initial triage assessment does not replace that evaluation, and the importance of remaining in the ED until their evaluation is complete.  Patient has had constipation for 3 days with no other associated symptoms including no nausea,, vomiting, chest pain, shortness of breath, abdominal pain.  Patient does endorse some mild rectal pain when attempting to pass his bowel.  Patient appears mildly anxious but no other concerning actions on triage.   Rodney Thomas, [place], [place] 09/22/24 2213

## 2024-09-22 NOTE — ED Triage Notes (Signed)
 Patient BIB [facility] from home for no bowel movement in 3 days. Patient was released from [facility] hospital 3 days ago and hasn't pooped since. EMS reports patient is nervous and anxious, patient currently rambling in triage.  BP 138/82 HR 104 RR 18 98% RA

## 2024-09-23 MED ORDER — POLYETHYLENE GLYCOL 3350 17 GM/SCOOP PO POWD
17.0000 g | Freq: Every day | ORAL | 0 refills | Status: AC
Start: 2024-09-23 — End: ?

## 2024-09-23 MED ORDER — FLEET ENEMA RE ENEM
1.0000 | ENEMA | Freq: Once | RECTAL | Status: AC
  Administered 2024-09-23: 1 via RECTAL
  Filled 2024-09-23: qty 1

## 2024-09-23 NOTE — Discharge Instructions (Addendum)
 Take MiraLAX  daily to prevent recurrent constipation.  Drink plenty of water to prevent dehydration as this could also worsen constipation.  Follow-up with a primary doctor.

## 2024-09-23 NOTE — ED Provider Notes (Signed)
 [place] EMERGENCY DEPARTMENT AT [facility] Provider Note   CSN: [id number] Arrival date & time: 09/22/24  2147     Patient presents with: Constipation   Rodney Thomas is a 33 y.o. male.   33 year old male presents to the emergency department for complaints of constipation.  Reports that his last bowel movement was 3 days ago.  He has had some discomfort when trying to have a bowel movement.  Continues to eat and drink normally.  No nausea, vomiting, abdominal distention, chest pain, shortness of breath.  He does have a history of recurrent constipation which he attributes to his daily medications.  He has not tried any over-the-counter remedies for symptom relief.  The history is provided by the patient. No language interpreter was used.  Constipation      Prior to Admission medications   Medication Sig Start Date End Date Taking? Authorizing Provider  polyethylene glycol powder (GLYCOLAX /MIRALAX ) 17 GM/SCOOP powder Take 17 g by mouth daily. Until daily soft stools  OTC 09/23/24  Yes Keith Sor, PA-C  benztropine  (COGENTIN ) 0.5 MG tablet Take 1 tablet (0.5 mg total) by mouth 2 (two) times daily as needed for tremors. 09/19/24   Donnelly Mellow, MD  busPIRone  (BUSPAR ) 10 MG tablet Take 1 tablet (10 mg total) by mouth 2 (two) times daily. 09/19/24   Jadapalle, Sree, MD  divalproex  (DEPAKOTE  ER) 250 MG 24 hr tablet Take 3 tablets (750 mg total) by mouth at bedtime. 09/19/24   Jadapalle, Sree, MD  Multiple Vitamin (MULTIVITAMIN WITH MINERALS) TABS tablet Take 1 tablet by mouth daily. 09/20/24   Jadapalle, Sree, MD  pantoprazole (PROTONIX) 20 MG tablet Take 1 tablet (20 mg total) by mouth daily. 09/20/24   Donnelly Mellow, MD  risperiDONE  (RISPERDAL ) 2 MG tablet Take 1 tablet (2 mg total) by mouth 2 (two) times daily. 09/19/24   Donnelly Mellow, MD  sertraline  (ZOLOFT ) 50 MG tablet Take 1 tablet (50 mg total) by mouth daily. 09/19/24 09/19/25  Donnelly Mellow, MD     Allergies: Patient has no known allergies.    Review of Systems  Gastrointestinal:  Positive for constipation.  Ten systems reviewed and are negative for acute change, except as noted in the HPI.    Updated Vital Signs BP 131/81 (BP Location: Right Arm)   Pulse 92   Temp 98.5 F (36.9 C)   Resp 18   Ht 5' 10 (1.778 m)   Wt 72.6 kg   SpO2 98%   BMI 22.96 kg/m   Physical Exam Vitals and nursing note reviewed.  Constitutional:      General: He is not in acute distress.    Appearance: He is well-developed. He is not diaphoretic.     Comments: Nontoxic appearing and in NAD  HENT:     Head: Normocephalic and atraumatic.  Eyes:     General: No scleral icterus.    Conjunctiva/sclera: Conjunctivae normal.  Pulmonary:     Effort: Pulmonary effort is normal. No respiratory distress.     Comments: Respirations even and unlabored Abdominal:     General: There is no distension.  Musculoskeletal:        General: Normal range of motion.     Cervical back: Normal range of motion.  Skin:    General: Skin is warm and dry.     Coloration: Skin is not pale.     Findings: No erythema or rash.  Neurological:     Mental Status: He is alert and oriented to  person, place, and time.  Psychiatric:        Behavior: Behavior normal.     (all labs ordered are listed, but only abnormal results are displayed) Labs Reviewed  BASIC METABOLIC PANEL WITH GFR - Abnormal; Notable for the following components:      Result Value   Glucose, Bld 117 (*)    All other components within normal limits  CBC WITH DIFFERENTIAL/PLATELET    EKG: None  Radiology: DG Abd Portable 1 View Result Date: 09/22/2024 EXAM: 1 VIEW XRAY OF THE ABDOMEN 09/22/2024 10:19:37 PM COMPARISON: CT with iv contrast 01/20/2024. CLINICAL HISTORY: Constipation. FINDINGS: BOWEL: Mild generalized fecal loading throughout the colon. Nonobstructive bowel gas pattern. No dilated bowel is seen to suspect mechanical  obstruction. SOFT TISSUES: No opaque urinary calculi. BONES: No acute osseous abnormality. ABDOMEN: There is no supine evidence of free air. LUNG BASES: The lung bases are clear. IMPRESSION: 1. Mild generalized fecal stasis throughout the colon. 2. No bowel obstruction or other acute radiographic findings . Electronically signed by: Francis Quam MD 09/22/2024 10:27 PM EDT RP Workstation: [id number]     Procedures   Medications Ordered in the ED  sodium phosphate (FLEET) enema 1 enema (1 enema Rectal Given 09/23/24 0040)    Clinical Course as of 09/23/24 0124  Fri Sep 23, 2024  0038 Labs reviewed and are reassuring.  X-ray shows no evidence of bowel obstruction, free air.  Stool is noted throughout the colon.  Will attempt enema for symptomatic relief. [KH]  [phone] Patient reports having successful bowel movement following Fleet enema [KH]    Clinical Course User Index [KH] Keith Sor, PA-C                                 Medical Decision Making Risk OTC drugs.   This patient presents to the ED for concern of constipation, this involves an extensive number of treatment options, and is a complaint that carries with it a high risk of complications and morbidity.  The differential diagnosis includes constipation vs pSBO/SBO vs fecal impaction vs ruptured viscous    Co morbidities that complicate the patient evaluation  Schizoaffective disorder OCB Insomnia   Additional history obtained:  External records from outside source obtained and reviewed including prior discharge summaries   Lab Tests:  I Ordered, and personally interpreted labs.  The pertinent results include:  normal CBC, BMP   Imaging Studies ordered:  I ordered imaging studies including Xray abdomen  I independently visualized and interpreted imaging which showed no obstructive bowel gas pattern or free air. I agree with the radiologist interpretation   Cardiac Monitoring:  The patient was maintained on  a cardiac monitor.  I personally viewed and interpreted the cardiac monitored which showed an underlying rhythm of: NSR   Medicines ordered and prescription drug management:  I ordered medication including Fleet enema for constipation  Reevaluation of the patient after these medicines showed that the patient resolved I have reviewed the patients home medicines and have made adjustments as needed   Test Considered:  CT abd/pelvis - felt low yield.   Problem List / ED Course:  Clinical evaluation consistent with constipation which has been relieved with a Fleet enema in the emergency department.  Labs otherwise at baseline.  Patient well-appearing.  Advised use of MiraLAX  as an outpatient.   Reevaluation:  After the interventions noted above, I reevaluated the patient and found that they  have :improved   Social Determinants of Health:  Lives independently   Dispostion:  After consideration of the diagnostic results and the patients response to treatment, I feel that the patent would benefit from daily Miralax  use and PCP f/u. Return precautions discussed and provided. Patient discharged in stable condition with no unaddressed concerns.       Final diagnoses:  Constipation, unspecified constipation type    ED Discharge Orders          Ordered    polyethylene glycol powder (GLYCOLAX /MIRALAX ) 17 GM/SCOOP powder  Daily        09/23/24 0057               Keith Sor, PA-C 09/23/24 0127    Raford Lenis, MD 09/23/24 [phone]

## 2024-10-04 ENCOUNTER — Other Ambulatory Visit: Payer: Self-pay

## 2024-10-04 ENCOUNTER — Emergency Department (HOSPITAL_COMMUNITY)
Admission: EM | Admit: 2024-10-04 | Discharge: 2024-10-05 | Disposition: A | Discharge status: Other Acute Inpatient Hospital | Payer: 59

## 2024-10-04 DIAGNOSIS — F99 Mental disorder, not otherwise specified: Secondary | ICD-10-CM

## 2024-10-04 DIAGNOSIS — F250 Schizoaffective disorder, bipolar type: Secondary | ICD-10-CM

## 2024-10-04 DIAGNOSIS — Z8659 Personal history of other mental and behavioral disorders: Secondary | ICD-10-CM | POA: Insufficient documentation

## 2024-10-04 LAB — CBC WITH DIFFERENTIAL/PLATELET
Abs Immature Granulocytes: 0.03 K/uL (ref 0.00–0.07)
Basophils Absolute: 0.1 K/uL (ref 0.0–0.1)
Basophils Relative: 1 %
Eosinophils Absolute: 0.1 K/uL (ref 0.0–0.5)
Eosinophils Relative: 2 %
HCT: 49.4 % (ref 39.0–52.0)
Hemoglobin: 16.6 g/dL (ref 13.0–17.0)
Immature Granulocytes: 0 %
Lymphocytes Relative: 31 %
Lymphs Abs: 2.8 K/uL (ref 0.7–4.0)
MCH: 28.3 pg (ref 26.0–34.0)
MCHC: 33.6 g/dL (ref 30.0–36.0)
MCV: 84.3 fL (ref 80.0–100.0)
Monocytes Absolute: 0.5 K/uL (ref 0.1–1.0)
Monocytes Relative: 6 %
Neutro Abs: 5.4 K/uL (ref 1.7–7.7)
Neutrophils Relative %: 60 %
Platelets: 288 K/uL (ref 150–400)
RBC: 5.86 MIL/uL — ABNORMAL HIGH (ref 4.22–5.81)
RDW: 14.6 % (ref 11.5–15.5)
WBC: 9 K/uL (ref 4.0–10.5)
nRBC: 0 % (ref 0.0–0.2)

## 2024-10-04 LAB — COMPREHENSIVE METABOLIC PANEL WITH GFR
ALT: 16 U/L (ref 0–44)
AST: 28 U/L (ref 15–41)
Albumin: 4.9 g/dL (ref 3.5–5.0)
Alkaline Phosphatase: 76 U/L (ref 38–126)
Anion gap: 11 (ref 5–15)
BUN: 12 mg/dL (ref 6–20)
CO2: 26 mmol/L (ref 22–32)
Calcium: 10 mg/dL (ref 8.9–10.3)
Chloride: 104 mmol/L (ref 98–111)
Creatinine, Ser: 1.21 mg/dL (ref 0.61–1.24)
GFR, Estimated: >60 mL/min (ref >60)
Glucose, Bld: 97 mg/dL (ref 70–99)
Potassium: 4.5 mmol/L (ref 3.5–5.1)
Sodium: 140 mmol/L (ref 135–145)
Total Bilirubin: 0.5 mg/dL (ref 0.0–1.2)
Total Protein: 8.2 g/dL — ABNORMAL HIGH (ref 6.5–8.1)

## 2024-10-04 LAB — URINE DRUG SCREEN
Amphetamines: NEGATIVE
Barbiturates: NEGATIVE
Benzodiazepines: NEGATIVE
Cocaine: NEGATIVE
Fentanyl: NEGATIVE
Methadone Scn, Ur: NEGATIVE
Opiates: NEGATIVE
Tetrahydrocannabinol: NEGATIVE

## 2024-10-04 LAB — ETHANOL: Alcohol, Ethyl (B): <15 mg/dL (ref <15)

## 2024-10-04 MED ORDER — BUSPIRONE HCL 10 MG PO TABS
10.0000 mg | ORAL_TABLET | Freq: Two times a day (BID) | ORAL | Status: DC
  Administered 2024-10-04 – 2024-10-05 (×2): 10 mg via ORAL
  Filled 2024-10-04 (×2): qty 1

## 2024-10-04 MED ORDER — BENZTROPINE MESYLATE 0.5 MG PO TABS: 0.5000 mg | ORAL_TABLET | Freq: Two times a day (BID) | ORAL | Status: DC | PRN

## 2024-10-04 MED ORDER — PANTOPRAZOLE SODIUM 20 MG PO TBEC
20.0000 mg | DELAYED_RELEASE_TABLET | Freq: Every day | ORAL | Status: DC
  Administered 2024-10-05: 20 mg via ORAL
  Filled 2024-10-04: qty 1

## 2024-10-04 MED ORDER — DIVALPROEX SODIUM ER 500 MG PO TB24
750.0000 mg | ORAL_TABLET | Freq: Every day | ORAL | Status: DC
  Administered 2024-10-04: 750 mg via ORAL
  Filled 2024-10-04: qty 1

## 2024-10-04 MED ORDER — RISPERIDONE 2 MG PO TABS
2.0000 mg | ORAL_TABLET | Freq: Two times a day (BID) | ORAL | Status: DC
Start: 2024-10-04 — End: 2024-10-05
  Administered 2024-10-04 – 2024-10-05 (×2): 2 mg via ORAL
  Filled 2024-10-04 (×2): qty 1

## 2024-10-04 MED ORDER — SERTRALINE HCL 50 MG PO TABS
50.0000 mg | ORAL_TABLET | Freq: Every day | ORAL | Status: DC
  Administered 2024-10-05: 50 mg via ORAL
  Filled 2024-10-04: qty 1

## 2024-10-04 NOTE — ED Notes (Signed)
 Sent urine to Lab

## 2024-10-04 NOTE — Consult Note (Signed)
 Rodney Thomas  Patient Name: Rodney Thomas MRN: [id number] DOB: 12-21-1990 DATE OF Consult: 10/04/2024  PRIMARY PSYCHIATRIC DIAGNOSES  1.  Schizoaffective disorder, bipolar type 2.  OCD, per history    RECOMMENDATIONS  Recommendations: Medication recommendations: Recommend risperidone  1mg  po BID; recommend hydroxyzine  50mg  po q6h PRN anxiety or insomnia; recommend olanzapine  10mg  po q8h PRN moderate agitation; recommend olanzapine  10mg  IM PRN emergent agitation Non-Medication/therapeutic recommendations: Psychiatric hospitalization  Is inpatient psychiatric hospitalization recommended for this patient? Yes (Explain why): Suicidal ideation with plan and ambivalent intent; paranoid ideation, not eating or sleeping, depressed, disorganized From a psychiatric perspective, is this patient appropriate for discharge to an outpatient setting/resource or other less restrictive environment for continued care?  No (Explain why): Suicidal ideation with plan, ambivalent intent; paranoid ideation, not eating or sleeping Follow-Up Telepsychiatry C/L services: We will sign off for now. Please re-consult our service if needed for any concerning changes in the patient's condition, discharge planning, or questions. Communication: Treatment team members (and family members if applicable) who were involved in treatment/care discussions and planning, and with whom we spoke or engaged with via secure text/chat, include the following: Team via Epic chat  [name] is a 33 year old male with a history of schizoaffective disorder, bipolar type, chronic insomnia and OCD who presents to the ED with mental issues, endorsing depressed mood, poor focus and feeling stressed. Chart reviewed. Psychiatry consulted for evaluation and management. On evaluation, patient hyperverbal with pressured speech, poor eye contact, anxious, expansive, tangential with paranoid ideation. Patient endorses not eating and  sleeping at home due to paranoia that he is being watched and poisoned. Endorses suicidal ideation with plan and ambivalent intent. Patient unable to engage in safety planning at the time of evaluation. Recommend inpatient psychiatric hospitalization.   Thank you for involving us  in the care of this patient. If you have any additional questions or concerns, please call [phone] and ask for me or the provider on-call.  TELEPSYCHIATRY ATTESTATION & CONSENT  As the provider for this telehealth consult, I attest that I verified the patient's identity using two separate identifiers, introduced myself to the patient, provided my credentials, disclosed my location, and performed this encounter via a HIPAA-compliant, real-time, face-to-face, two-way, interactive audio and video platform and with the full consent and agreement of the patient (or guardian as applicable.)  Patient physical location: ED in [facility]  Telehealth provider physical location: home office in state of [place]    Video start time: 2144 EST Video end time: 2159 EST   IDENTIFYING DATA  Rodney Thomas is a 33 y.o. year-old male for whom a psychiatric consultation has been ordered by the primary provider. The patient was identified using two separate identifiers.  CHIEF COMPLAINT/REASON FOR CONSULT  Mental issues    HISTORY OF PRESENT ILLNESS (HPI)  Rodney Thomas is a 33 year old male with a history of schizoaffective disorder, bipolar type, chronic insomnia and OCD who presents to the ED with mental issues, endorsing depressed mood, poor focus and feeling stressed. Chart reviewed. Psychiatry consulted for evaluation and management.   On evaluation, patient hyperverbal with pressured speech, poor eye contact, anxious, expansive, tangential with paranoid ideation. Patient states, I have really bad depression and it's bringing me down every day. Patient reports his daughter was killed in a car accident in April.  He states, I'm just really depressed. Patient reports he stopped taking his psychiatric medication because I am lost in my head. Patient states, I don't do things straight.  I don't know what I'm doing. Patient endorses active suicidal ideation, states he has had thoughts to drown himself, I want something fast like drowning myself or suffocating myself. Patient is currently ambivalent about suicidal intent. He states, This is the hardest time in my whole entire life. Patient states, I don't sleep or eat at all. Patient reports he is sleeping 2-3 hours per night. Denies feeling tired during the day. Patient reports he is not eating because he believes he is being poisoned. He states, Every time I eat I feel weird body pains and chest pain. I feel like I did something to hurt someone. Patient reports thoughts keep me up at night. Patient denies auditory and visual hallucinations, denies homicidal ideation. Patient states, I'm so mad. I get bipolar. Patient reports I just feel like people are out to get me in the hospital because they know a lot about the human body. Patient states he hasn't eaten the food he was given because he worries someone poisoned it. He states, That   Spoke separately to patient's mom [phone]). She reports patient is not sleeping, not eating for several days due to worrying people are poisoning him, reports he makes frequent suicidal statements. She states that he tells her he has nothing to live for and wants to kill himself. States he has tried to jump in front of cars and was on a parking deck stating he wants to kill himself. She states he has made suicidal statements as recently as today. Reports patient is responding to internal stimuli and having full blown conversations with himself. States patient is not taking his psychiatric medication because he believes the pharmacy is poisoning him. Reports patient is obsessional and compulsive and has to do things a  certain number of times and if he doesn't he has a psychologist, counselling. States he will repeatedly turn his phone off and on until he gets to a certain number. She states she is concerned for patient's safety and does not feel he is safe to return home. She states he is very unstable.    PAST PSYCHIATRIC HISTORY  Trauma/abuse/neglect/exploitation: Denies Prior psych med trials: Depakote , risperidone , Zoloft , Buspar , Cogentin  Prior psychiatric hospitalizations: Multiple, most recent 08/2024 Violence: Yes  Suicide attempts: Interrupted overdose attempt; attempted to drown himself  C-SSRS 1) In the past month have you wished you were dead or wished you could go to sleep and not wake up? [x]  Yes []   No 2) In the past month have you actually had any thoughts of killing yourself? [x]   Yes  []   No If YES to 2, ask questions 3, 4, 5, and 6. If NO to 2, go directly to question 6 3) In the past month have you been thinking about how you might do this? [x]   Yes  []   No 4) In the past month have you had these thoughts and had some intention of acting on them?  [x]   Yes []   No 5) In the past month have you started to work out or worked out the details of how to kill yourself? Do you intend to carry out this plan? []   Yes [x]   No 6) Have you ever done anything, started to do anything, or prepared to do anything to end your life? [x]   Yes []   No Otherwise as per HPI above.  PAST MEDICAL HISTORY  Past Medical History:  Diagnosis Date   Chronic insomnia 01/16/2024   Obsessive-compulsive behavior 01/16/2024   Schizoaffective disorder, bipolar type (  HCC) 01/16/2024   Seasonal allergies    Vitamin D  deficiency 01/17/2024     HOME MEDICATIONS  PTA Medications  Medication Sig   busPIRone  (BUSPAR ) 10 MG tablet Take 1 tablet (10 mg total) by mouth 2 (two) times daily.   risperiDONE  (RISPERDAL ) 2 MG tablet Take 1 tablet (2 mg total) by mouth 2 (two) times daily.   sertraline  (ZOLOFT ) 50 MG tablet Take 1 tablet (50 mg  total) by mouth daily.   pantoprazole (PROTONIX) 20 MG tablet Take 1 tablet (20 mg total) by mouth daily.   benztropine  (COGENTIN ) 0.5 MG tablet Take 1 tablet (0.5 mg total) by mouth 2 (two) times daily as needed for tremors.   divalproex  (DEPAKOTE  ER) 250 MG 24 hr tablet Take 3 tablets (750 mg total) by mouth at bedtime.   Multiple Vitamin (MULTIVITAMIN WITH MINERALS) TABS tablet Take 1 tablet by mouth daily.   polyethylene glycol powder (GLYCOLAX /MIRALAX ) 17 GM/SCOOP powder Take 17 g by mouth daily. Until daily soft stools  OTC     ALLERGIES  No Known Allergies  SOCIAL & SUBSTANCE USE HISTORY  Social History   Socioeconomic History   Marital status: Single    Spouse name: Not on file   Number of children: Not on file   Years of education: Not on file   Highest education level: Not on file  Occupational History   Not on file  Tobacco Use   Smoking status: Never   Smokeless tobacco: Never  Vaping Use   Vaping status: Never Used  Substance and Sexual Activity   Alcohol use: No   Drug use: No   Sexual activity: Not Currently  Other Topics Concern   Not on file  Social History Narrative   Not on file   Social Drivers of Health   Financial Resource Strain: Not on file  Food Insecurity: Food Insecurity Present (09/10/2024)   Hunger Vital Sign    Worried About Running Out of Food in the Last Year: Sometimes true    Ran Out of Food in the Last Year: Sometimes true  Transportation Needs: No Transportation Needs (09/10/2024)   PRAPARE - Administrator, Civil Service (Medical): No    Lack of Transportation (Non-Medical): No  Physical Activity: Not on file  Stress: Not on file  Social Connections: Not on file   Social History   Tobacco Use  Smoking Status Never  Smokeless Tobacco Never   Social History   Substance and Sexual Activity  Alcohol Use No   Social History   Substance and Sexual Activity  Drug Use No      FAMILY HISTORY   Family  Psychiatric History (if known):  Mother: Depression; Brother: OCD  MENTAL STATUS EXAM (MSE)  Mental Status Exam: General Appearance: Fairly Groomed  Orientation:  Full (Time, Place, and Person)  Memory:  Immediate;   Fair Recent;   Fair  Concentration:  Concentration: Fair  Recall:  Fair  Attention  Fair  Eye Contact:  Good  Speech:  Pressured  Language:  Good  Volume:  Normal  Mood: Very depressed  Affect:  Non-Congruent  Thought Process:  Disorganized  Thought Content:  Paranoid Ideation and Tangential  Suicidal Thoughts:  Yes.  with intent/plan  Homicidal Thoughts:  No  Judgement:  Impaired  Insight:  Shallow  Psychomotor Activity:  Normal  Akathisia:  NA  Fund of Knowledge:  Good    Assets:  Communication Skills Housing Social Support  Cognition:  WNL  ADL's:  Intact  AIMS (if indicated):       VITALS  Blood pressure 113/80, pulse 78, temperature 98 F (36.7 C), temperature source Oral, resp. rate 16, SpO2 100%.  LABS  Admission on 10/04/2024  Component Date Value Ref Range Status   Sodium 10/04/2024 140  135 - 145 mmol/L Final   Potassium 10/04/2024 4.5  3.5 - 5.1 mmol/L Final   Chloride 10/04/2024 104  98 - 111 mmol/L Final   CO2 10/04/2024 26  22 - 32 mmol/L Final   Glucose, Bld 10/04/2024 97  70 - 99 mg/dL Final   Glucose reference range applies only to samples taken after fasting for at least 8 hours.   BUN 10/04/2024 12  6 - 20 mg/dL Final   Creatinine, Ser 10/04/2024 1.21  0.61 - 1.24 mg/dL Final   Calcium 88/88/7974 10.0  8.9 - 10.3 mg/dL Final   Total Protein 88/88/7974 8.2 (H)  6.5 - 8.1 g/dL Final   Albumin 88/88/7974 4.9  3.5 - 5.0 g/dL Final   AST 88/88/7974 28  15 - 41 U/L Final   HEMOLYSIS AT THIS LEVEL MAY AFFECT RESULT   ALT 10/04/2024 16  0 - 44 U/L Final   Alkaline Phosphatase 10/04/2024 76  38 - 126 U/L Final   Total Bilirubin 10/04/2024 0.5  0.0 - 1.2 mg/dL Final   GFR, Estimated 10/04/2024 >60  >60 mL/min Final   Comment:  (Thomas) Calculated using the CKD-EPI Creatinine Equation (2021)    Anion gap 10/04/2024 11  5 - 15 Final   Performed at [facility], [address] [place], [place] [address]   Alcohol, Ethyl (B) 10/04/2024 <15  <15 mg/dL Final   Comment: (Thomas) For medical purposes only. Performed at [facility], [address], [place], [place] [address]    Opiates 10/04/2024 NEGATIVE  NEGATIVE Final   Cocaine 10/04/2024 NEGATIVE  NEGATIVE Final   Benzodiazepines 10/04/2024 NEGATIVE  NEGATIVE Final   Amphetamines 10/04/2024 NEGATIVE  NEGATIVE Final   Tetrahydrocannabinol 10/04/2024 NEGATIVE  NEGATIVE Final   Barbiturates 10/04/2024 NEGATIVE  NEGATIVE Final   Methadone Scn, Ur 10/04/2024 NEGATIVE  NEGATIVE Final   Fentanyl  10/04/2024 NEGATIVE  NEGATIVE Final   Comment: (Thomas) Drug screen is for Medical Purposes only. Positive results are preliminary only. If confirmation is needed, notify lab within 5 days.  Drug Class                 Cutoff (ng/mL) Amphetamine and metabolites 1000 Barbiturate and metabolites 200 Benzodiazepine              200 Opiates and metabolites     300 Cocaine and metabolites     300 THC                         50 Fentanyl                     5 Methadone                   300  Trazodone  is metabolized in vivo to several metabolites,  including pharmacologically active m-CPP, which is excreted in the  urine.  Immunoassay screens for amphetamines and MDMA have potential  cross-reactivity with these compounds and may provide false positive  result.  Performed at [facility], [address] [place], [place] [address]    WBC 10/04/2024 9.0  4.0 - 10.5 K/uL Final  RBC 10/04/2024 5.86 (H)  4.22 - 5.81 MIL/uL Final   Hemoglobin 10/04/2024 16.6  13.0 - 17.0 g/dL Final   HCT 88/88/7974 49.4  39.0 - 52.0 % Final   MCV 10/04/2024 84.3  80.0 - 100.0 fL Final   MCH 10/04/2024 28.3  26.0 - 34.0 pg Final   MCHC  10/04/2024 33.6  30.0 - 36.0 g/dL Final   RDW 88/88/7974 14.6  11.5 - 15.5 % Final   Platelets 10/04/2024 288  150 - 400 K/uL Final   nRBC 10/04/2024 0.0  0.0 - 0.2 % Final   Neutrophils Relative % 10/04/2024 60  % Final   Neutro Abs 10/04/2024 5.4  1.7 - 7.7 K/uL Final   Lymphocytes Relative 10/04/2024 31  % Final   Lymphs Abs 10/04/2024 2.8  0.7 - 4.0 K/uL Final   Monocytes Relative 10/04/2024 6  % Final   Monocytes Absolute 10/04/2024 0.5  0.1 - 1.0 K/uL Final   Eosinophils Relative 10/04/2024 2  % Final   Eosinophils Absolute 10/04/2024 0.1  0.0 - 0.5 K/uL Final   Basophils Relative 10/04/2024 1  % Final   Basophils Absolute 10/04/2024 0.1  0.0 - 0.1 K/uL Final   Immature Granulocytes 10/04/2024 0  % Final   Abs Immature Granulocytes 10/04/2024 0.03  0.00 - 0.07 K/uL Final   Performed at [facility], [address] [place], [place] [address]    PSYCHIATRIC REVIEW OF SYSTEMS (ROS)  ROS: Notable for the following relevant positive findings: See HPI  Additional findings:      Musculoskeletal: No abnormal movements observed      Gait & Station: Laying/Sitting      Pain Screening: Denies      Nutrition & Dental Concerns: Denies    RISK FORMULATION/ASSESSMENT  Is the patient experiencing any suicidal or homicidal ideations: Yes       Explain if yes: Endorses suicidal ideation with plan and ambivalent intent  Protective factors considered for safety management: Social support   Risk factors/concerns considered for safety management:  Prior attempt Depression Hopelessness Impulsivity Aggression Male gender  Is there a astronomer plan with the patient and treatment team to minimize risk factors and promote protective factors: Yes           Explain: Psychiatric hospitalization  Is crisis care placement or psychiatric hospitalization recommended: Yes     Based on my current evaluation and risk assessment, patient is determined at this time to be at:   High risk  *RISK ASSESSMENT Risk assessment is a dynamic process; it is possible that this patient's condition, and risk level, may change. This should be re-evaluated and managed over time as appropriate. Please re-consult psychiatric consult services if additional assistance is needed in terms of risk assessment and management. If your team decides to discharge this patient, please advise the patient how to best access emergency psychiatric services, or to call 911, if their condition worsens or they feel unsafe in any way.   Erla JAYSON Rase, MD Telepsychiatry Consult Services

## 2024-10-04 NOTE — ED Provider Notes (Signed)
 [place] EMERGENCY DEPARTMENT AT [facility] Provider Note   CSN: [id number] Arrival date & time: 10/04/24  1451     Patient presents with: Psychiatric Evaluation   Rodney Thomas is a 33 y.o. male.   Patient with history of insomnia, OCD, schizoaffective disorder presents today requesting psychiatric evaluation. Reports that he has been more depressed recently. When asked if he is feeling suicidal, he reports borderline. Denies any plan. No HI or AVH.  States that his medications were upsetting his stomach and therefore he stopped them and is not taking anything at this time. Denies any somatic complaints.   The history is provided by the patient. No language interpreter was used.       Prior to Admission medications   Medication Sig Start Date End Date Taking? Authorizing Provider  benztropine  (COGENTIN ) 0.5 MG tablet Take 1 tablet (0.5 mg total) by mouth 2 (two) times daily as needed for tremors. 09/19/24   Donnelly Mellow, MD  busPIRone  (BUSPAR ) 10 MG tablet Take 1 tablet (10 mg total) by mouth 2 (two) times daily. 09/19/24   Jadapalle, Sree, MD  divalproex  (DEPAKOTE  ER) 250 MG 24 hr tablet Take 3 tablets (750 mg total) by mouth at bedtime. 09/19/24   Jadapalle, Sree, MD  Multiple Vitamin (MULTIVITAMIN WITH MINERALS) TABS tablet Take 1 tablet by mouth daily. 09/20/24   Jadapalle, Sree, MD  pantoprazole (PROTONIX) 20 MG tablet Take 1 tablet (20 mg total) by mouth daily. 09/20/24   Jadapalle, Sree, MD  polyethylene glycol powder (GLYCOLAX /MIRALAX ) 17 GM/SCOOP powder Take 17 g by mouth daily. Until daily soft stools  OTC 09/23/24   Keith Sor, PA-C  risperiDONE  (RISPERDAL ) 2 MG tablet Take 1 tablet (2 mg total) by mouth 2 (two) times daily. 09/19/24   Donnelly Mellow, MD  sertraline  (ZOLOFT ) 50 MG tablet Take 1 tablet (50 mg total) by mouth daily. 09/19/24 09/19/25  Donnelly Mellow, MD    Allergies: Patient has no known allergies.    Review of Systems  All  other systems reviewed and are negative.   Updated Vital Signs BP 113/80 (BP Location: Left Arm)   Pulse 78   Temp 98 F (36.7 C) (Oral)   Resp 16   SpO2 100%   Physical Exam Vitals and nursing note reviewed.  Constitutional:      General: He is not in acute distress.    Appearance: Normal appearance. He is normal weight. He is not ill-appearing, toxic-appearing or diaphoretic.  HENT:     Head: Normocephalic and atraumatic.  Cardiovascular:     Rate and Rhythm: Normal rate.  Pulmonary:     Effort: Pulmonary effort is normal. No respiratory distress.  Musculoskeletal:        General: Normal range of motion.     Cervical back: Normal range of motion.  Skin:    General: Skin is warm and dry.  Neurological:     General: No focal deficit present.     Mental Status: He is alert.  Psychiatric:        Mood and Affect: Mood normal.        Behavior: Behavior normal.     Comments: Does not appear to be responding to internal stimuli     (all labs ordered are listed, but only abnormal results are displayed) Labs Reviewed  COMPREHENSIVE METABOLIC PANEL WITH GFR  ETHANOL  URINE DRUG SCREEN  CBC WITH DIFFERENTIAL/PLATELET    EKG: None  Radiology: No results found.   Procedures  Medications Ordered in the ED - No data to display                                  Medical Decision Making Amount and/or Complexity of Data Reviewed Labs: ordered.   Patient is a 33 y.o. male  who presents to the emergency department for psychiatric complaint.  Past Medical History:  has a past medical history of Chronic insomnia (01/16/2024), Obsessive-compulsive behavior (01/16/2024), Schizoaffective disorder, bipolar type (HCC) (01/16/2024), Seasonal allergies, and Vitamin D  deficiency (01/17/2024).  Physical Exam: Well-appearing, does not appear to be responding to internal stimuli  Labs: Medical clearance labs ordered, with following pertinent results: No acute laboratory  abnormalities  Cardiac monitoring: EKG obtained and interpreted by attending physician which shows: sinus rhythm, no STEMI, QTc prolongation, or arrhythmia.  Disposition: Patient is medically cleared at this time. Will consult TTS and appreciate their recommendations.  Final diagnoses:  None    ED Discharge Orders     None          Nora Lauraine DELENA DEVONNA 10/04/24 1749    Ula Prentice SAUNDERS, MD 10/05/24 [phone]

## 2024-10-04 NOTE — BH Assessment (Signed)
 Clinician spoke with IRIS to complete pt's TTS assessment. Clinician provided pt's name, MRN, location, age, room number and provider's name. Secure message completed.    Per Iris coordinator pt to be assessed at 2115.   Jackson JONETTA Broach, MS, [facility], [facility] Triage Specialist [phone]

## 2024-10-04 NOTE — ED Notes (Signed)
 Pt has changed into scrubs.  Pt has been wand by security.  1 bag of belongings at 1-4 Nurses' station.  Nose and ear piercing in.  Pt stated that they are permanent.

## 2024-10-04 NOTE — ED Triage Notes (Signed)
 Pt ambulatory to triage stating that he is experiencing mental issues. Pt states that he is stressed, depressed, poor focus, and my mind is lost. PT states that he has sought help in the past, but leaves before he should.   Pt denies active thoughts of SI/HI.

## 2024-10-04 NOTE — ED Notes (Signed)
 Per pt mother pt is not taking medication and has become increasingly paranoid and SI.

## 2024-10-04 NOTE — ED Notes (Signed)
 1 bag place in locker 37 tcu

## 2024-10-04 NOTE — ED Notes (Signed)
 1 belongings bag placed in cabinet for Rodney Thomas

## 2024-10-05 ENCOUNTER — Inpatient Hospital Stay (HOSPITAL_COMMUNITY)
Admission: AD | Admit: 2024-10-05 | Discharge: 2024-10-10 | DRG: 885 | Disposition: A | Discharge status: Home / Self Care | Source: Intra-hospital

## 2024-10-05 ENCOUNTER — Encounter (HOSPITAL_COMMUNITY): Payer: Self-pay | Admitting: Psychiatry

## 2024-10-05 DIAGNOSIS — Z79899 Other long term (current) drug therapy: Secondary | ICD-10-CM | POA: Diagnosis not present

## 2024-10-05 DIAGNOSIS — E559 Vitamin D deficiency, unspecified: Secondary | ICD-10-CM | POA: Diagnosis present

## 2024-10-05 DIAGNOSIS — Z5948 Other specified lack of adequate food: Secondary | ICD-10-CM

## 2024-10-05 DIAGNOSIS — F429 Obsessive-compulsive disorder, unspecified: Secondary | ICD-10-CM | POA: Diagnosis present

## 2024-10-05 DIAGNOSIS — R4681 Obsessive-compulsive behavior: Principal | ICD-10-CM | POA: Diagnosis present

## 2024-10-05 DIAGNOSIS — Z5941 Food insecurity: Secondary | ICD-10-CM | POA: Diagnosis not present

## 2024-10-05 DIAGNOSIS — G479 Sleep disorder, unspecified: Secondary | ICD-10-CM | POA: Diagnosis present

## 2024-10-05 DIAGNOSIS — Z87820 Personal history of traumatic brain injury: Secondary | ICD-10-CM

## 2024-10-05 DIAGNOSIS — F250 Schizoaffective disorder, bipolar type: Secondary | ICD-10-CM | POA: Diagnosis present

## 2024-10-05 DIAGNOSIS — R000 Tachycardia, unspecified: Secondary | ICD-10-CM | POA: Diagnosis present

## 2024-10-05 DIAGNOSIS — Z560 Unemployment, unspecified: Secondary | ICD-10-CM | POA: Diagnosis not present

## 2024-10-05 DIAGNOSIS — F411 Generalized anxiety disorder: Secondary | ICD-10-CM | POA: Diagnosis present

## 2024-10-05 DIAGNOSIS — F259 Schizoaffective disorder, unspecified: Secondary | ICD-10-CM | POA: Diagnosis present

## 2024-10-05 DIAGNOSIS — F5104 Psychophysiologic insomnia: Secondary | ICD-10-CM | POA: Diagnosis present

## 2024-10-05 MED ORDER — SERTRALINE HCL 50 MG PO TABS
50.0000 mg | ORAL_TABLET | Freq: Every day | ORAL | Status: DC
Start: 2024-10-06 — End: 2024-10-06
  Administered 2024-10-06: 50 mg via ORAL
  Filled 2024-10-05: qty 1

## 2024-10-05 MED ORDER — ACETAMINOPHEN 325 MG PO TABS
650.0000 mg | ORAL_TABLET | Freq: Four times a day (QID) | ORAL | Status: DC | PRN
  Administered 2024-10-09: 650 mg via ORAL
  Filled 2024-10-05: qty 2

## 2024-10-05 MED ORDER — TRAZODONE HCL 50 MG PO TABS
50.0000 mg | ORAL_TABLET | Freq: Every evening | ORAL | Status: DC | PRN
  Filled 2024-10-05: qty 10
  Filled 2024-10-05 (×2): qty 1

## 2024-10-05 MED ORDER — LORAZEPAM 2 MG/ML IJ SOLN: 2.0000 mg | Freq: Three times a day (TID) | INTRAMUSCULAR | Status: DC | PRN

## 2024-10-05 MED ORDER — DIVALPROEX SODIUM ER 500 MG PO TB24
750.0000 mg | ORAL_TABLET | Freq: Every day | ORAL | Status: DC
  Administered 2024-10-05: 750 mg via ORAL
  Filled 2024-10-05: qty 1

## 2024-10-05 MED ORDER — DIPHENHYDRAMINE HCL 50 MG/ML IJ SOLN: 50.0000 mg | Freq: Three times a day (TID) | INTRAMUSCULAR | Status: DC | PRN

## 2024-10-05 MED ORDER — BUSPIRONE HCL 10 MG PO TABS
10.0000 mg | ORAL_TABLET | Freq: Two times a day (BID) | ORAL | Status: DC
  Administered 2024-10-05 – 2024-10-06 (×2): 10 mg via ORAL
  Filled 2024-10-05 (×2): qty 1

## 2024-10-05 MED ORDER — HALOPERIDOL LACTATE 5 MG/ML IJ SOLN: 10.0000 mg | Freq: Three times a day (TID) | INTRAMUSCULAR | Status: DC | PRN

## 2024-10-05 MED ORDER — RISPERIDONE 2 MG PO TABS
2.0000 mg | ORAL_TABLET | Freq: Two times a day (BID) | ORAL | Status: DC
Start: 2024-10-05 — End: 2024-10-06
  Administered 2024-10-05 – 2024-10-06 (×2): 2 mg via ORAL
  Filled 2024-10-05 (×2): qty 1

## 2024-10-05 MED ORDER — HALOPERIDOL LACTATE 5 MG/ML IJ SOLN: 5.0000 mg | Freq: Three times a day (TID) | INTRAMUSCULAR | Status: DC | PRN

## 2024-10-05 MED ORDER — ALUM & MAG HYDROXIDE-SIMETH 200-200-20 MG/5ML PO SUSP: 30.0000 mL | ORAL | Status: DC | PRN

## 2024-10-05 MED ORDER — HALOPERIDOL 5 MG PO TABS
5.0000 mg | ORAL_TABLET | Freq: Three times a day (TID) | ORAL | Status: DC | PRN
  Administered 2024-10-07: 5 mg via ORAL
  Filled 2024-10-05: qty 1

## 2024-10-05 MED ORDER — DIPHENHYDRAMINE HCL 25 MG PO CAPS
50.0000 mg | ORAL_CAPSULE | Freq: Three times a day (TID) | ORAL | Status: DC | PRN
  Administered 2024-10-07: 50 mg via ORAL
  Filled 2024-10-05: qty 2

## 2024-10-05 MED ORDER — BENZTROPINE MESYLATE 0.5 MG PO TABS: 0.5000 mg | ORAL_TABLET | Freq: Two times a day (BID) | ORAL | Status: DC | PRN

## 2024-10-05 MED ORDER — MAGNESIUM HYDROXIDE 400 MG/5ML PO SUSP
30.0000 mL | Freq: Every day | ORAL | Status: DC | PRN
  Administered 2024-10-09: 30 mL via ORAL
  Filled 2024-10-05: qty 30

## 2024-10-05 NOTE — Group Note (Signed)
 Date:  10/05/2024 Time:  5:09 PM  Group Topic/Focus: 5 Love languages Healthy Communication:   The focus of this group is to discuss communication, barriers to communication, as well as healthy ways to communicate with others.    Participation Level:  Active  Participation Quality:  Appropriate  Affect:  Appropriate  Cognitive:  Appropriate  Insight: Appropriate  Engagement in Group:  Engaged  Modes of Intervention:  Discussion and Education  Additional Comments:    Juliene CHRISTELLA Huddle 10/05/2024, 5:09 PM

## 2024-10-05 NOTE — Plan of Care (Signed)
 Patient alert and oriented. Denies SI, HI, AVH and pain at this time. Scheduled medications per MAR. Support and encouragement provided.  Routine safety checks conducted every 15 minutes.  Patient informed to notify staff with problems or concerns. No adverse drug reactions noted. Patient verbally contracts for safety at this time. Patient interacts well with others on the unit.  Patient remains safe at this time.  Problem: Education: Goal: Emotional status will improve Outcome: Progressing   Problem: Education: Goal: Mental status will improve Outcome: Progressing   Problem: Education: Goal: Verbalization of understanding the information provided will improve Outcome: Progressing   Problem: Education: Goal: Verbalization of understanding the information provided will improve Outcome: Progressing

## 2024-10-05 NOTE — ED Provider Notes (Signed)
 Emergency Medicine Observation Re-evaluation Note  Rodney Thomas is a 33 y.o. male, seen on rounds today.  Pt initially presented to the ED for complaints of Psychiatric Evaluation Currently, the patient is resint .  Physical Exam  BP 106/63 (BP Location: Right Arm)   Pulse 70   Temp 97.8 F (36.6 C) (Oral)   Resp 18   SpO2 100%  Physical Exam General: nad   ED Course / MDM  EKG:   I have reviewed the labs performed to date as well as medications administered while in observation.  Recent changes in the last 24 hours include none .  Plan  Current plan is for placement inpatient psychiatric hospitalization.SABRA Simon Lavonia LOISE, MD 10/05/24 [phone]

## 2024-10-05 NOTE — Plan of Care (Signed)
  Problem: Activity: Goal: Interest or engagement in activities will improve Outcome: Progressing   Problem: Coping: Goal: Ability to verbalize frustrations and anger appropriately will improve Outcome: Progressing   Problem: Education: Goal: Emotional status will improve Outcome: Progressing

## 2024-10-05 NOTE — Progress Notes (Signed)
 Pt has been accepted to [facility] on 10/05/2024 Bed assignment: 402-02  Pt meets inpatient criteria per: Erla Rase MD  Attending Physician will be: Leita Arts MD  Report can be called to:  unit: Adult unit: [phone]  Pt can arrive after [facility] WILL UPDATE   Care Team Notified: [facility]  Cherylynn Ernst RN, Cathaleen Jacobson NP, Landry Bull RN  [place] Skylin Kennerson LCSW-A   10/05/2024 10:03 AM

## 2024-10-05 NOTE — Tx Team (Signed)
 Initial Treatment Plan 10/05/2024 5:33 PM Rodney Thomas FMW:[id number]    PATIENT STRESSORS: Health problems     PATIENT STRENGTHS: Ability for insight  Supportive family/friends    PATIENT IDENTIFIED PROBLEMS: Depression   Anxiety   Paranoia                  DISCHARGE CRITERIA:  Ability to meet basic life and health needs Verbal commitment to aftercare and medication compliance  PRELIMINARY DISCHARGE PLAN: Return to previous living arrangement  PATIENT/FAMILY INVOLVEMENT: This treatment plan has been presented to and reviewed with the patient, Rodney Thomas, has been given the opportunity to ask questions and make suggestions.  Joaquin MALVA Doing, RN 10/05/2024, 5:33 PM

## 2024-10-05 NOTE — Progress Notes (Signed)

## 2024-10-05 NOTE — ED Notes (Signed)
 Patient off unit to [facility] per provider. Patient alert, calm, cooperative, no s/s of distress at time of discharge. Discharge information and belongings given to [employer]. Patient ambulatory off unit, escorted by NT. Patient transported by [employer].

## 2024-10-05 NOTE — ED Notes (Signed)
 Patient has been alert this AM.  Patient calm at this time. Patient medication compliant.

## 2024-10-05 NOTE — Progress Notes (Signed)
 Admission note: Patient is a 33 year old Male, who was voluntarily admitted from [facility] for psych evaluation, due to mental  health issues, and worsening depression. Patient arrived to the unit at 1215 via safe transport, awake, alert and oriented X's 4.  Patient denies SI/HI/AVH during admission interview with him. When patient was asked the reason of his admission to the [facility] unit, patient states,  I came in for depression, that is it, I have been really depressed lately. Patient is pleasant on approach, and stated he knows he will get help in the [facility], that's why he decided to come in.  Pt has orientation to unit, room and routine. Information packet given to patient and safety information discussed with him   Admission INP armband ID verified with patient, and in place,  fall risk assessment complete with Patient and  he verbalized understanding of risks associated with falls. No contraband found during skin assessment, Skin, clean-dry- intact without evidence of bruising, or skin tears and tracks marks. Q 15 minutes safety observation in place. Staff will continue to provide support to patient.

## 2024-10-05 NOTE — Group Note (Signed)
 Date:  10/05/2024 Time:  1:03 PM  Group Topic/Focus:  Pharmacy Group     Participation Level:  Did Not Attend    Rodney Thomas Mars 10/05/2024, 1:03 PM

## 2024-10-06 MED ORDER — DIVALPROEX SODIUM ER 500 MG PO TB24
1000.0000 mg | ORAL_TABLET | Freq: Every day | ORAL | Status: DC
  Administered 2024-10-07 – 2024-10-10 (×4): 1000 mg via ORAL
  Filled 2024-10-06: qty 14
  Filled 2024-10-06: qty 2
  Filled 2024-10-06 (×2): qty 14
  Filled 2024-10-06 (×2): qty 2
  Filled 2024-10-06 (×3): qty 14
  Filled 2024-10-06: qty 2
  Filled 2024-10-06: qty 14

## 2024-10-06 MED ORDER — PALIPERIDONE ER 6 MG PO TB24
6.0000 mg | ORAL_TABLET | Freq: Every day | ORAL | Status: DC
  Administered 2024-10-06: 6 mg via ORAL
  Filled 2024-10-06 (×2): qty 1

## 2024-10-06 MED ORDER — SERTRALINE HCL 50 MG PO TABS
75.0000 mg | ORAL_TABLET | Freq: Every day | ORAL | Status: DC
  Administered 2024-10-07 – 2024-10-10 (×4): 75 mg via ORAL
  Filled 2024-10-06 (×6): qty 1

## 2024-10-06 NOTE — BHH Group Notes (Signed)
 Patient did not attend Occupational Therapy Group.

## 2024-10-06 NOTE — Group Note (Signed)
 LCSW Group Therapy Note   Group Date: 10/06/2024 Start Time: 1100 End Time: 1200   Participation:  did not attend   Type of Therapy:  Group Therapy   Topic:  Healing From Within: Understanding Our Past, Building Our Future"  Objective:  To help participants understand the impact of early experiences on mental and physical health, with a focus on Adverse Childhood Experiences (ACEs), and to explore ways to build resilience and healing.  Group Goals: - Understand ACEs and Their Impact: Learn how childhood experiences shape mental and physical health. - Build Resilience: Develop strategies for overcoming challenges and creating positive change. - Promote Healing: Recognize the value of support and the possibility of healing through therapy and self-care.  Summary: In today's session, we discussed how early experiences, especially ACEs, impact mental and physical health. We explored the effects of stress, abuse, and neglect on brain development and well-being. The group focused on resilience, understanding that healing and positive change are possible with support and self-awareness.  Therapeutic Modalities Used: - Psychoeducation: Sharing information about ACEs and their effects. - Cognitive Behavioral Therapy (CBT): Helping reframe negative thought patterns. - Trauma-Informed Therapy: Creating a safe, supportive space for healing.   Josy Peaden O Yazan Gatling, LCSWA 10/06/2024  12:31 PM

## 2024-10-06 NOTE — Progress Notes (Signed)
(  Sleep Hours) -7.00 (Any PRNs that were needed, meds refused, or side effects to meds)- N/A (Any disturbances and when (visitation, over night)-N/A (Concerns raised by the patient)- N/A (SI/HI/AVH)-Denies
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
## 2024-10-06 NOTE — BHH Group Notes (Signed)
 Patient attended Nutrition Group.

## 2024-10-06 NOTE — [facility] Suicide Risk Assessment (Signed)
 Suicide Risk Assessment  Admission Assessment    [facility] Admission Suicide Risk Assessment   Nursing information obtained from:  Patient Demographic factors:  Male Current Mental Status:  NA Loss Factors:  NA Historical Factors:  NA Risk Reduction Factors:  NA  Total Time spent with patient: 45 minutes Principal Problem: Schizoaffective disorder, bipolar type (HCC) Diagnosis:  Principal Problem:   Schizoaffective disorder, bipolar type (HCC) Active Problems:   Obsessive-compulsive behavior  Subjective Data: CC: I can't get over this depression man   Reason for Admission: Rodney Thomas is a 33 year old male with a history of schizoaffective disorder, bipolar type, chronic insomnia and OCD who presented to the [facility] ED with mental issues, endorsing depressed mood, poor focus and feeling stressed.    History of Present Illness:  Patient reports that he has had worsening depression over the last few weeks since his discharge from the [facility] where he was previously in a psychiatric inpatient unit for depression, schizoaffective disorder, OCD symptoms.   Patient was discharged from the hospital on 10/27 and returned to live with his mother here in [place] but describes his depression as worsening ever since. He cites ongoing difficulties with obsessive thoughts including a preoccupation with the numbers 3, 4, 6 (anytime he encounters these numbers he finds that he has to count, then recheck his count, change something or do something different in order to make it a different number for instance if it comes up on a pair of dice).     He also cites an increasing problem with obtaining food due to a preoccupation with cleanliness and fear of being poisoned.  Patient is aware that it is not real that he is being poisoned by people, but he cannot shake the feeling unless the food comes out of a bag or container that he opens himself.  He states that this also  contributes to his difficulty with adherence to medication.  He states that he is paranoid when he is in a place like this (gestures wildly) but the nurses will be giving him someone else's medication or trying to hurt him.  He reports that he is spending more than 30 minutes to an hour a day on these kinds of thoughts.  He finds that he has to redo things like washing his hands because he is worried that he is contaminated and he feels that he has lost weight as a result of being unable to eat foods, socially isolated because he cannot go to restaurants, and extreme difficulty finding employment as this sense of paranoia and preoccupation with cleanliness makes it hard for other people to be around him.  Patient is aware of it and finds it extremely distressing.   Patient is also having considerable difficulty sleeping.  He states that before last night here in the hospital, he had barely slept since his previous hospitalization ended on 10/27.  He estimates less than 4 hours per night over that period.  He states that it is mostly difficulty falling asleep due to racing thoughts.   He denies a history of hallucinations though previous hospitalizations indicate that there have been some in the past.      Continued Clinical Symptoms:  Alcohol Use Disorder Identification Test Final Score (AUDIT): 0 The Alcohol Use Disorders Identification Test, Guidelines for Use in Primary Care, Second Edition.  [employer] [facility]). Score between 0-7:  no or low risk or alcohol related problems. Score between 8-15:  moderate risk of  alcohol related problems. Score between 16-19:  high risk of alcohol related problems. Score 20 or above:  warrants further diagnostic evaluation for alcohol dependence and treatment.   CLINICAL FACTORS:   Severe Anxiety and/or Agitation Bipolar Disorder:   Mixed State Schizophrenia:   Less than 10 years old More than one psychiatric  diagnosis   Musculoskeletal: Strength & Muscle Tone: within normal limits Gait & Station: normal Patient leans: N/A     Psychiatric Specialty Exam: Mental Status Exam:   Appearance: Patient is a thin appearing mixed-race man.  He looks younger than stated age.  Pronounced strabismus  Behavior: Guarded, paranoid, frustrated  Attitude: Cooperative to the best of his abilities.  Trying to remain calm  Speech: Extremely loud, clear and coherent, rapid and pressured  Mood: This depression is terrible  Affect: Labile, guarded  Thought Process: Circumstantial,  Thought Content: Some delusions, paranoia about people poisoning his food.  Obsessions involving numbers, cleanliness.  SI/HI: Passive SI.  Denies HI  Perceptions: Denies history of hallucinations.  Judgement: Shallow  Insight: Fair  Fund of Knowledge: WNL           Sleep  Sleep:No data recorded   Physical Exam: Vitals and nursing note reviewed.  Constitutional:      Appearance: He is normal weight.  HENT:     Head: Normocephalic and atraumatic.     Nose: Nose normal.  Pulmonary:     Effort: Pulmonary effort is normal.  Musculoskeletal:        General: Signs of injury present. Normal range of motion.  Skin:    General: Skin is warm and dry.  Neurological:     Mental Status: He is alert and oriented to person, place, and time.  Psychiatric:        Attention and Perception: Attention normal.        Mood and Affect: Mood is anxious. Affect is labile.        Speech: Speech is rapid and pressured and tangential.        Behavior: Behavior is agitated. Behavior is cooperative.        Thought Content: Thought content is paranoid and delusional. Thought content includes suicidal ideation. Thought content does not include homicidal ideation.        Cognition and Memory: Memory normal. Cognition is impaired.        Judgment: Judgment is impulsive.     Review of Systems  Constitutional:  Negative for chills,  diaphoresis, fever, malaise/fatigue and weight loss.  Respiratory:  Negative for cough.   Cardiovascular:  Negative for chest pain.  Gastrointestinal:  Negative for abdominal pain, constipation, diarrhea, nausea and vomiting.  Genitourinary:  Negative for urgency.  Psychiatric/Behavioral:  Positive for depression and suicidal ideas. Negative for hallucinations and substance abuse. The patient is nervous/anxious and has insomnia.    Blood pressure 114/75, pulse 77, temperature 97.7 F (36.5 C), temperature source Oral, resp. rate 17, height 5' 10 (1.778 m), weight 76.7 kg, SpO2 99%. Body mass index is 24.25 kg/m.   COGNITIVE FEATURES THAT CONTRIBUTE TO RISK:  Loss of executive function and Polarized thinking    SUICIDE RISK:   Moderate: Patient has frequent passive suicidal thoughts.  Cites worsening depression, significant loss in April of his child, inability to obtain employment, social isolation.  Only significant source of support for the patient is his mother whom he lives with.  Patient also suffers from several conditions where the risk of suicide is significant.  Primary thought disorder  and mood disorder schizoaffective disorder along with the extreme frustration that goes along with obsessive-compulsive disorder.  PLAN OF CARE: See H&P  I certify that inpatient services furnished can reasonably be expected to improve the patient's condition.   Lynwood Morene Lavone Delsie, MD 10/06/2024, 4:30 PM

## 2024-10-06 NOTE — Group Note (Signed)
 Date:  10/06/2024 Time:  10:17 AM  Group Topic/Focus:  Goals Group:   The focus of this group is to help patients establish daily goals to achieve during treatment and discuss how the patient can incorporate goal setting into their daily lives to aide in recovery.    Participation Level:  None  Participation Quality:  Appropriate  Affect:  Appropriate  Cognitive:  Alert  Insight: None  Engagement in Group:  None  Modes of Intervention:  Discussion  Additional Comments:  Patient attended group but did not participate.  Rosaleen BIRCH Artyom Stencel 10/06/2024, 10:17 AM

## 2024-10-06 NOTE — [facility] Group Notes (Signed)
 Patient did not attend Social Work Group.

## 2024-10-06 NOTE — H&P (Signed)
 Psychiatric Admission Assessment Adult  Patient Identification: Rodney Thomas MRN:  [id number] Date of Evaluation:  10/06/2024 Chief Complaint:  Schizoaffective disorder (HCC) [F25.9] Principal Diagnosis: Schizoaffective disorder (HCC) Diagnosis:  Principal Problem:   Schizoaffective disorder (HCC)  CC: I can't get over this depression man  Reason for Admission: Rodney Thomas is a 33 year old male with a history of schizoaffective disorder, bipolar type, chronic insomnia and OCD who presented to the [facility] ED with mental issues, endorsing depressed mood, poor focus and feeling stressed.   History of Present Illness:  Patient reports that he has had worsening depression over the last few weeks since his discharge from the [facility] where he was previously in a psychiatric inpatient unit for depression, schizoaffective disorder, OCD symptoms.  Patient was discharged from the hospital on 10/27 and returned to live with his mother here in [place] but describes his depression as worsening ever since. He cites ongoing difficulties with obsessive thoughts including a preoccupation with the numbers 3, 4, 6 (anytime he encounters these numbers he finds that he has to count, then recheck his count, change something or do something different in order to make it a different number for instance if it comes up on a pair of dice).    He also cites an increasing problem with obtaining food due to a preoccupation with cleanliness and fear of being poisoned.  Patient is aware that it is not real that he is being poisoned by people, but he cannot shake the feeling unless the food comes out of a bag or container that he opens himself.  He states that this also contributes to his difficulty with adherence to medication.  He states that he is paranoid when he is in a place like this (gestures wildly) but the nurses will be giving him someone else's medication or trying to hurt him.  He  reports that he is spending more than 30 minutes to an hour a day on these kinds of thoughts.  He finds that he has to redo things like washing his hands because he is worried that he is contaminated and he feels that he has lost weight as a result of being unable to eat foods, socially isolated because he cannot go to restaurants, and extreme difficulty finding employment as this sense of paranoia and preoccupation with cleanliness makes it hard for other people to be around him.  Patient is aware of it and finds it extremely distressing.  Patient is also having considerable difficulty sleeping.  He states that before last night here in the hospital, he had barely slept since his previous hospitalization ended on 10/27.  He estimates less than 4 hours per night over that period.  He states that it is mostly difficulty falling asleep due to racing thoughts.  He denies a history of hallucinations though previous hospitalizations indicate that there have been some in the past.   Associated Signs/Symptoms: Depression Symptoms:  depressed mood, anhedonia, insomnia, feelings of worthlessness/guilt, difficulty concentrating, hopelessness, suicidal thoughts without plan, anxiety, panic attacks, disturbed sleep, weight loss, decreased appetite, (Hypo) Manic Symptoms:  Delusions, Distractibility, Elevated Mood, Flight of Ideas, Grandiosity, Impulsivity, Irritable Mood, Labiality of Mood, Anxiety Symptoms:  Excessive Worry, Panic Symptoms, Obsessive Compulsive Symptoms:   Checking Counting Handwashing, Social Anxiety, Psychotic Symptoms:  Ideas of Reference, Paranoia, Patient endorses thought broadcasting/thought reading, saying that he believes people are able to read his mind and his thoughts and his past just by walking by sometimes. PTSD Symptoms: Re-experiencing:  Flashbacks Intrusive Thoughts Hypervigilance:  Yes Hyperarousal:  Difficulty Concentrating Emotional  Numbness/Detachment Increased Startle Response Irritability/Anger Avoidance:  Decreased Interest/Participation  Total Time spent with patient: 45 minutes  Past Psychiatric History:  Past Psychiatric Hx: Previous Psych Diagnoses: schizoaffective bipolar type, OCD, chronic insomnia Prior inpatient treatment: [facility] 09/10/2024 Current/prior outpatient treatment: Prior rehab hx: Psychotherapy hx: History of suicide: History of homicide or aggression:  Psychiatric medication history:benztropine , risperidone , sertraline , buspirone   Psychiatric medication compliance history: Neuromodulation history:  Current Psychiatrist: Current therapist:   Substance Abuse Hx: Alcohol: Denies alcohol use Tobacco: Denies nicotine use Illicit drugs: Denies all, UDS clean Prescription Drug abuse problems: Denies any  Past Medical History: Medical Diagnoses: No significant Home Rx: Denies Prior Hosp: Prior Surgeries/Trauma: Compound fracture of left forearm at age 16 Head trauma, LOC, concussions, seizures: Denies Allergies: Denies  Family History: Medical: Noncontributory Psych: Denies Psych Rx: SA/HA: Substance use family hx:  Social History: Childhood (bring, raised, lives now, parents, siblings, schooling, education): Born and raised in [place], currently lives with mother since approximately February.  Patient had a very hard time in school.  Socially isolated, bullied.  Graduated from [employer] Abuse: Denies Marital Status: Single Sexual orientation: Prefers women Children: Patient's child died in a car crash in April 18, 2025Employment: Unemployed, actively looking for work Peer Group: Patient is isolated from others Housing: Stable housing with mother Finances: Finances are strained Legal: No current legal troubles Military: No military affiliation Firearms: No access  Is the patient at risk to self?  Yes Has the patient been a risk to self in the past 6 months?   Yes Has the patient been a risk to self within the distant past?  Yes Is the patient a risk to others?  No Has the patient been a risk to others in the past 6 months?  No Has the patient been a risk to others within the distant past?  No  Columbia Scale:  Flowsheet Row [facility]) [facility]22/2024 in [facility] Department at [facility] Admission (Discharged) from 09/10/2024 in [facility] INPATIENT BE[facility]AL MEDICINE  C-SSRS RISK CATEGORY No Risk No Risk Low Risk     Alcohol Screening: 1. How often do you have a drink containing alcohol?: Never 2. How many drinks containing alcohol do you have on a typical day when you are drinking?: 1 or 2 3. How often do you have six or more drinks on one occasion?: Never AUDIT-C Score: 0 4. How often during the last year have you found that you were not able to stop drinking once you had started?: Never 5. How often during the last year have you failed to do what was normally expected from you because of drinking?: Never 6. How often during the last year have you needed a first drink in the morning to get yourself going after a heavy drinking session?: Never 7. How often during the last year have you had a feeling of guilt of remorse after drinking?: Never 8. How often during the last year have you been unable to remember what happened the night before because you had been drinking?: Never 9. Have you or someone else been injured as a result of your drinking?: No 10. Has a relative or friend or a doctor or another health worker been concerned about your drinking or suggested you cut down?: No Alcohol Use Disorder Identification Test Final Score (AUDIT): 0 Substance Abuse History in the  last 12 months:  No. Consequences of Substance Abuse: NA Previous Psychotropic Medications: Yes  Psychological Evaluations: Yes  Past Medical History:  Past Medical History:   Diagnosis Date   Chronic insomnia 01/16/2024   Obsessive-compulsive behavior 01/16/2024   Schizoaffective disorder, bipolar type (HCC) 01/16/2024   Seasonal allergies    Vitamin D  deficiency 01/17/2024    Past Surgical History:  Procedure Laterality Date   APPENDECTOMY     ORIF RADIAL FRACTURE Left 03/17/2014   Procedure: OPEN REDUCTION INTERNAL FIXATION (ORIF) RADIAL FRACTURE;  Surgeon: Oneil JAYSON Herald, MD;  Location: [facility] OR;  Service: Orthopedics;  Laterality: Left;  Open Reduction Internal Fixation Left Radial Shaft Fracture   Family History: History reviewed. No pertinent family history.  Tobacco Screening:  Social History   Tobacco Use  Smoking Status Never  Smokeless Tobacco Never    BH Tobacco Counseling     Are you interested in Tobacco Cessation Medications?  N/A, patient does not use tobacco products Counseled patient on smoking cessation:  N/A, patient does not use tobacco products Reason Tobacco Screening Not Completed: No value filed.       Social History:  Social History   Substance and Sexual Activity  Alcohol Use No     Social History   Substance and Sexual Activity  Drug Use No    Additional Social History: Marital status: Single Are you sexually active?: No What is your sexual orientation?: Heterosexual Has your sexual activity been affected by drugs, alcohol, medication, or emotional stress?: No Does patient have children?: Yes How many children?: 1 How is patient's relationship with their children?: Daughter died in May 16, 2025car accident          Allergies:  No Known Allergies Lab Results:  Results for orders placed or performed during the hospital encounter of 10/04/24 (from the past 48 hours)  Urine rapid drug screen (hosp performed)     Status: None   Collection Time: 10/04/24  4:24 PM  Result Value Ref Range   Opiates NEGATIVE NEGATIVE   Cocaine NEGATIVE NEGATIVE   Benzodiazepines NEGATIVE NEGATIVE   Amphetamines NEGATIVE NEGATIVE    Tetrahydrocannabinol NEGATIVE NEGATIVE   Barbiturates NEGATIVE NEGATIVE   Methadone Scn, Ur NEGATIVE NEGATIVE   Fentanyl  NEGATIVE NEGATIVE    Comment: (NOTE) Drug screen is for Medical Purposes only. Positive results are preliminary only. If confirmation is needed, notify lab within 5 days.  Drug Class                 Cutoff (ng/mL) Amphetamine and metabolites 1000 Barbiturate and metabolites 200 Benzodiazepine              200 Opiates and metabolites     300 Cocaine and metabolites     300 THC                         50 Fentanyl                     5 Methadone                   300  Trazodone  is metabolized in vivo to several metabolites,  including pharmacologically active m-CPP, which is excreted in the  urine.  Immunoassay screens for amphetamines and MDMA have potential  cross-reactivity with these compounds and may provide false positive  result.  Performed at [facility], [address] [place], [place] [address]   Comprehensive metabolic  panel     Status: Abnormal   Collection Time: 10/04/24  4:43 PM  Result Value Ref Range   Sodium 140 135 - 145 mmol/L   Potassium 4.5 3.5 - 5.1 mmol/L   Chloride 104 98 - 111 mmol/L   CO2 26 22 - 32 mmol/L   Glucose, Bld 97 70 - 99 mg/dL    Comment: Glucose reference range applies only to samples taken after fasting for at least 8 hours.   BUN 12 6 - 20 mg/dL   Creatinine, Ser 8.78 0.61 - 1.24 mg/dL   Calcium 89.9 8.9 - 89.6 mg/dL   Total Protein 8.2 (H) 6.5 - 8.1 g/dL   Albumin 4.9 3.5 - 5.0 g/dL   AST 28 15 - 41 U/L    Comment: HEMOLYSIS AT THIS LEVEL MAY AFFECT RESULT   ALT 16 0 - 44 U/L   Alkaline Phosphatase 76 38 - 126 U/L   Total Bilirubin 0.5 0.0 - 1.2 mg/dL   GFR, Estimated >39 >39 mL/min    Comment: (NOTE) Calculated using the CKD-EPI Creatinine Equation (2021)    Anion gap 11 5 - 15    Comment: Performed at [facility], [address] [place], [place] [address]   Ethanol     Status: None   Collection Time: 10/04/24  4:43 PM  Result Value Ref Range   Alcohol, Ethyl (B) <15 <15 mg/dL    Comment: (NOTE) For medical purposes only. Performed at [facility], [address], [place], [place] [address]   CBC with Diff     Status: Abnormal   Collection Time: 10/04/24  4:43 PM  Result Value Ref Range   WBC 9.0 4.0 - 10.5 K/uL   RBC 5.86 (H) 4.22 - 5.81 MIL/uL   Hemoglobin 16.6 13.0 - 17.0 g/dL   HCT 50.5 60.9 - 47.9 %   MCV 84.3 80.0 - 100.0 fL   MCH 28.3 26.0 - 34.0 pg   MCHC 33.6 30.0 - 36.0 g/dL   RDW 85.3 88.4 - 84.4 %   Platelets 288 150 - 400 K/uL   nRBC 0.0 0.0 - 0.2 %   Neutrophils Relative % 60 %   Neutro Abs 5.4 1.7 - 7.7 K/uL   Lymphocytes Relative 31 %   Lymphs Abs 2.8 0.7 - 4.0 K/uL   Monocytes Relative 6 %   Monocytes Absolute 0.5 0.1 - 1.0 K/uL   Eosinophils Relative 2 %   Eosinophils Absolute 0.1 0.0 - 0.5 K/uL   Basophils Relative 1 %   Basophils Absolute 0.1 0.0 - 0.1 K/uL   Immature Granulocytes 0 %   Abs Immature Granulocytes 0.03 0.00 - 0.07 K/uL    Comment: Performed at [facility], [address] [place], [place] [address]    Blood Alcohol level:  Lab Results  Component Value Date   [facility] <15 10/04/2024   ETH <15 09/10/2024    Metabolic Disorder Labs:  Lab Results  Component Value Date   HGBA1C 5.2 09/10/2024   MPG 102.54 09/10/2024   MPG 108.28 01/16/2024   No results found for: PROLACTIN Lab Results  Component Value Date   CHOL 290 (H) 09/10/2024   TRIG 39 09/10/2024   HDL 61 09/10/2024   CHOLHDL 4.8 09/10/2024   VLDL 8 09/10/2024   LDLCALC 221 (H) 09/10/2024   LDLCALC 72 01/16/2024    Current Medications: Current Facility-Administered Medications  Medication Dose Route Frequency Provider Last Rate Last Admin  acetaminophen  (TYLENOL ) tablet 650 mg  650 mg Oral Q6H PRN Motley-Mangrum, [name], PMHNP       alum & mag hydroxide-simeth (MAALOX/MYLANTA)  200-200-20 MG/5ML suspension 30 mL  30 mL Oral Q4H PRN Motley-Mangrum, [name], PMHNP       benztropine  (COGENTIN ) tablet 0.5 mg  0.5 mg Oral BID PRN Motley-Mangrum, [name], PMHNP       busPIRone  (BUSPAR ) tablet 10 mg  10 mg Oral BID Motley-Mangrum, [name], PMHNP   10 mg at 10/06/24 0830   haloperidol  (HALDOL ) tablet 5 mg  5 mg Oral TID PRN Motley-Mangrum, [name], PMHNP       And   diphenhydrAMINE  (BENADRYL ) capsule 50 mg  50 mg Oral TID PRN Motley-Mangrum, [name], PMHNP       haloperidol  lactate (HALDOL ) injection 5 mg  5 mg Intramuscular TID PRN Motley-Mangrum, [name], PMHNP       And   diphenhydrAMINE  (BENADRYL ) injection 50 mg  50 mg Intramuscular TID PRN Motley-Mangrum, [name], PMHNP       And   LORazepam  (ATIVAN ) injection 2 mg  2 mg Intramuscular TID PRN Motley-Mangrum, [name], PMHNP       haloperidol  lactate (HALDOL ) injection 10 mg  10 mg Intramuscular TID PRN Motley-Mangrum, [name], PMHNP       And   diphenhydrAMINE  (BENADRYL ) injection 50 mg  50 mg Intramuscular TID PRN Motley-Mangrum, [name], PMHNP       And   LORazepam  (ATIVAN ) injection 2 mg  2 mg Intramuscular TID PRN Motley-Mangrum, [name], PMHNP       divalproex  (DEPAKOTE  ER) 24 hr tablet 750 mg  750 mg Oral QHS Motley-Mangrum, [name], PMHNP   750 mg at 10/05/24 2111   magnesium  hydroxide (MILK OF MAGNESIA) suspension 30 mL  30 mL Oral Daily PRN Motley-Mangrum, [name], PMHNP       risperiDONE  (RISPERDAL ) tablet 2 mg  2 mg Oral BID Motley-Mangrum, [name], PMHNP   2 mg at 10/06/24 0830   sertraline  (ZOLOFT ) tablet 50 mg  50 mg Oral Daily Motley-Mangrum, [name], PMHNP   50 mg at 10/06/24 0830   traZODone  (DESYREL ) tablet 50 mg  50 mg Oral QHS PRN Motley-Mangrum, [name], PMHNP       PTA Medications: Medications Prior to Admission  Medication Sig Dispense Refill Last Dose/Taking   benztropine  (COGENTIN ) 0.5 MG tablet Take 1 tablet (0.5 mg total) by mouth 2 (two) times daily as needed for tremors. 15  tablet 0    busPIRone  (BUSPAR ) 10 MG tablet Take 1 tablet (10 mg total) by mouth 2 (two) times daily. 60 tablet 0    divalproex  (DEPAKOTE  ER) 250 MG 24 hr tablet Take 3 tablets (750 mg total) by mouth at bedtime. 90 tablet 0    Multiple Vitamin (MULTIVITAMIN WITH MINERALS) TABS tablet Take 1 tablet by mouth daily. 30 tablet 0    pantoprazole (PROTONIX) 20 MG tablet Take 1 tablet (20 mg total) by mouth daily. 30 tablet 0    polyethylene glycol powder (GLYCOLAX /MIRALAX ) 17 GM/SCOOP powder Take 17 g by mouth daily. Until daily soft stools  OTC 238 g 0    risperiDONE  (RISPERDAL ) 2 MG tablet Take 1 tablet (2 mg total) by mouth 2 (two) times daily. 60 tablet 0    sertraline  (ZOLOFT ) 50 MG tablet Take 1 tablet (50 mg total) by mouth daily. 30 tablet 0     AIMS:  ,  ,  ,  ,  ,  ,  Musculoskeletal: Strength & Muscle Tone: within normal limits Gait & Station: normal Patient leans: N/A   Psychiatric Specialty Exam: Mental Status Exam:  Appearance: Patient is a thin appearing mixed-race man.  He looks younger than stated age.  Pronounced strabismus  Behavior: Guarded, paranoid, frustrated  Attitude: Cooperative to the best of his abilities.  Trying to remain calm  Speech: Extremely loud, clear and coherent, rapid and pressured  Mood: This depression is terrible  Affect: Labile, guarded  Thought Process: Circumstantial,  Thought Content: Some delusions, paranoia about people poisoning his food.  Obsessions involving numbers, cleanliness.  SI/HI: Passive SI.  Denies HI  Perceptions: Denies history of hallucinations.  Judgement: Shallow  Insight: Fair  Fund of Knowledge: WNL      Physical Exam: Physical Exam Vitals and nursing note reviewed.  Constitutional:      Appearance: He is normal weight.  HENT:     Head: Normocephalic and atraumatic.     Nose: Nose normal.  Pulmonary:     Effort: Pulmonary effort is normal.  Musculoskeletal:        General: Signs of injury present.  Normal range of motion.  Skin:    General: Skin is warm and dry.  Neurological:     Mental Status: He is alert and oriented to person, place, and time.  Psychiatric:        Attention and Perception: Attention normal.        Mood and Affect: Mood is anxious. Affect is labile.        Speech: Speech is rapid and pressured and tangential.        Behavior: Behavior is agitated. Behavior is cooperative.        Thought Content: Thought content is paranoid and delusional. Thought content includes suicidal ideation. Thought content does not include homicidal ideation.        Cognition and Memory: Memory normal. Cognition is impaired.        Judgment: Judgment is impulsive.    Review of Systems  Constitutional:  Negative for chills, diaphoresis, fever, malaise/fatigue and weight loss.  Respiratory:  Negative for cough.   Cardiovascular:  Negative for chest pain.  Gastrointestinal:  Negative for abdominal pain, constipation, diarrhea, nausea and vomiting.  Genitourinary:  Negative for urgency.  Psychiatric/Behavioral:  Positive for depression and suicidal ideas. Negative for hallucinations and substance abuse. The patient is nervous/anxious and has insomnia.    Blood pressure 114/75, pulse 77, temperature 97.7 F (36.5 C), temperature source Oral, resp. rate 17, height 5' 10 (1.778 m), weight 76.7 kg, SpO2 99%. Body mass index is 24.25 kg/m. Assessment: Kevan Prouty is a 33 year old male past psychiatric history significant for schizoaffective disorder, obsessive-compulsive disorder.   Patient had recent hospitalization, but feels that things have not gotten better since he left the hospital.  Will attempt to call collateral tomorrow.  Patient was preoccupied with timing of medications, possible side effects of medications.  He feels that the risperidone  has been good but he only wants to take it once a day.  Patient has obsession with having all of his meds in the daytime and his vitamins  at night.  This seems to come from patient's obsession with things being clean and orderly.  Provisional diagnoses includes schizoaffective disorder bipolar type, decompensated.  Also strong suspicion of obsessive-compulsive disorder.  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  Treatment Plan Summary: Daily contact with patient to assess and evaluate symptoms and progress in treatment and Medication management  Observation Level/Precautions:  15 minute checks  Laboratory: Added on vitamin D  and vitamin B12  Psychotherapy:    Medications:   Discontinue home risperidone  due to patient preference Start paliperidone 6 mg daily for psychosis, mood stability Increase sertraline  75 mg for OCD Change Depakote  ER from night to morning, increase dose from 750 mg to 1000 mg for mood stability  Consultations:    Discharge Concerns:    Estimated LOS: 5 to 7 days  Other:     Physician Treatment Plan for Primary Diagnosis: Schizoaffective disorder (HCC) Long Term Goal(s): Improvement in symptoms so as ready for discharge  Short Term Goals: Ability to maintain clinical measurements within normal limits will improve, Compliance with prescribed medications will improve, and Ability to identify triggers associated with substance abuse/mental health issues will improve  Physician Treatment Plan for Secondary Diagnosis: Principal Problem:   Schizoaffective disorder (HCC)  Long Term Goal(s): Improvement in symptoms so as ready for discharge  Short Term Goals: Ability to identify changes in lifestyle to reduce recurrence of condition will improve, Ability to verbalize feelings will improve, Ability to disclose and discuss suicidal ideas, and Ability to identify triggers associated with substance abuse/mental health issues will improve  I certify that inpatient services furnished can reasonably be expected to improve the patient's condition.    Lynwood Morene Lavone Delsie,  MD 11/13/202510:51 AM

## 2024-10-06 NOTE — Plan of Care (Signed)
   Problem: Education: Goal: Knowledge of [facility] Education information/materials will improve Outcome: Progressing Goal: Verbalization of understanding the information provided will improve Outcome: Progressing

## 2024-10-06 NOTE — Group Note (Signed)
 Date:  10/06/2024 Time:  5:19 PM  Group Topic/Focus:  Managing Feelings:   The focus of this group is to identify what feelings patients have difficulty handling and develop a plan to handle them in a healthier way upon discharge.    Participation Level:  None  Participation Quality:  Appropriate  Affect:  Appropriate  Cognitive:  Alert  Insight: None  Engagement in Group:  None  Modes of Intervention:  Discussion  Additional Comments:  Patient attended group about Emotional Wellness but did not participate.  Rodney Thomas Amere Bricco 10/06/2024, 5:19 PM

## 2024-10-06 NOTE — Progress Notes (Addendum)
 D. Pt presents very anxious, worried at times (frequently asking whether or not what he is taking is safe.) Pt has been visible in the milieu, observed going to groups and interacting appropriately with peers. Pt reported sleeping well last night, described his appetite as 'fair', energy level as 'normal', and concentration as 'good'. Per pt's self inventory, pt rated his depression,hopelessness and anxiety a 7/0/1, respectively. Pt currently denies SI/HI and AVH and doesn't appear to be responding to internal stimuli A. Labs and vitals monitored. Pt given and educated on medications. Pt supported emotionally and encouraged to express concerns and ask questions.   R. Pt remains safe with 15 minute checks. Will continue POC.    10/06/24 1000  Psych Admission Type (Psych Patients Only)  Admission Status Voluntary  Psychosocial Assessment  Patient Complaints Depression  Eye Contact Brief  Facial Expression Anxious  Affect Anxious  Speech Logical/coherent  Interaction Assertive  Motor Activity Other (Comment) (steady gait)  Appearance/Hygiene Unremarkable  Behavior Characteristics Cooperative  Mood Anxious;Depressed;Pleasant  Aggressive Behavior  Effect No apparent injury  Thought Process  Coherency WDL  Content WDL  Delusions None reported or observed  Perception WDL  Hallucination None reported or observed  Judgment Impaired  Confusion None  Danger to Self  Current suicidal ideation? Denies

## 2024-10-07 ENCOUNTER — Encounter (HOSPITAL_COMMUNITY): Payer: Self-pay

## 2024-10-07 LAB — VITAMIN D 25 HYDROXY (VIT D DEFICIENCY, FRACTURES): Vit D, 25-Hydroxy: 18.99 ng/mL — ABNORMAL LOW (ref 30–100)

## 2024-10-07 LAB — VITAMIN B12: Vitamin B-12: 915 pg/mL — ABNORMAL HIGH (ref 180–914)

## 2024-10-07 MED ORDER — HYDROXYZINE HCL 25 MG PO TABS
25.0000 mg | ORAL_TABLET | Freq: Three times a day (TID) | ORAL | Status: DC | PRN
  Filled 2024-10-07: qty 10
  Filled 2024-10-07: qty 1
  Filled 2024-10-07: qty 10
  Filled 2024-10-07 (×2): qty 1

## 2024-10-07 MED ORDER — RISPERIDONE 2 MG PO TBDP
2.0000 mg | ORAL_TABLET | Freq: Two times a day (BID) | ORAL | Status: DC
  Administered 2024-10-07 – 2024-10-08 (×2): 2 mg via ORAL
  Filled 2024-10-07 (×2): qty 1

## 2024-10-07 NOTE — Progress Notes (Signed)
 Pt at nurse's station c/o squeezing chest pain. I had it all day but I didn't say anything. I didn't know this was a hospital until now. I thought it was a place to come when you needed treatment. So you are a real nurse and there are doctors here? Why didn't I know that?  Pt ruminating on the fact that a maintenance staff member came into his room today to replace a valve in his shower. Still asking about chemicals getting into his body through his skin and his mouth that will cause a heart attack. Provider contacted. Given an order for EKG. Pt's VS assessed and wnl. EKG was also NSR. Pt shown his VS and his EKG result. So, nothing is going to get into my body and make my heart explode? This EKG will stay the same for the next 2 hours? Pt agreed to take medication to assist with his anxiety and increasing agitation. Pt given mild agitation protocol PO per MAR. This conversation communicated to provider. Will continue to monitor.  VS 120/86 (sitting) Pulse 70 O2 100% RA

## 2024-10-07 NOTE — Progress Notes (Signed)
   10/07/24 0800  Psych Admission Type (Psych Patients Only)  Admission Status Voluntary  Psychosocial Assessment  Patient Complaints Worrying;Depression  Eye Contact Brief  Facial Expression Anxious  Affect Apprehensive;Anxious;Appropriate to circumstance  Speech Logical/coherent  Interaction Assertive  Motor Activity Fidgety  Appearance/Hygiene Unremarkable  Behavior Characteristics Cooperative  Mood Pleasant;Anxious  Thought Process  Coherency WDL  Content WDL  Delusions None reported or observed  Perception WDL  Hallucination None reported or observed  Judgment Impaired  Confusion None  Danger to Self  Current suicidal ideation? Denies  Agreement Not to Harm Self Yes  Description of Agreement verbal  Danger to Others  Danger to Others None reported or observed

## 2024-10-07 NOTE — Group Note (Signed)
 Recreation Therapy Group Note   Group Topic:Problem Solving  Group Date: 10/07/2024 Start Time: 0945 End Time: 1016 Facilitators: Chaunda Vandergriff-McCall, LRT,CTRS Location: [address] Dayroom   Group Topic: Communication, Team Building, Problem Solving  Goal Area(s) Addresses:  Patient will effectively work with peer towards shared goal.  Patient will identify skills used to make activity successful.  Patient will identify how skills used during activity can be used to reach post d/c goals.   Behavioral Response:   Intervention: STEM Activity  Activity: Straw Bridge. In teams of 3-5, patients were given 15 plastic drinking straws and an equal length of masking tape. Using the materials provided, patients were instructed to build a free standing bridge-like structure to suspend an everyday item (ex: puzzle box) off of the floor or table surface. All materials were required to be used by the team in their design. LRT facilitated post-activity discussion reviewing team process. Patients were encouraged to reflect how the skills used in this activity can be generalized to daily life post discharge.   Education: Pharmacist, Community, Scientist, Physiological, Discharge Planning   Education Outcome: Acknowledges education/In group clarification offered/Needs additional education.    Affect/Mood: N/A   Participation Level: Did not attend    Clinical Observations/Individualized Feedback:      Plan: Continue to engage patient in RT group sessions 2-3x/week.   Rodney Thomas, LRT,CTRS  10/07/2024 12:54 PM

## 2024-10-07 NOTE — Progress Notes (Signed)
 [facility] MD Progress Note 10/07/2024 7:57 AM Rodney Thomas  MRN:  [id number] Principal Problem: Schizoaffective disorder, bipolar type (HCC) Diagnosis: Principal Problem:   Schizoaffective disorder, bipolar type (HCC) Active Problems:   Obsessive-compulsive behavior   Subjective:   Rodney Thomas is a 33 year old male with a history of schizoaffective disorder, bipolar type, chronic insomnia and OCD who presented to the [facility] ED with mental issues, endorsing depressed mood, poor focus and feeling stressed.   Chart Review from last 24 hours and discussion during bed progression: The patient's chart was reviewed and nursing notes were reviewed. The patient's case was discussed in multidisciplinary team meeting.   - events per chart review / staff report: none - Patient received only the following scheduled medications: 1 dose of paliperidone yesterday afternoon sertraline  this morning Depakote  this morning - Patient did not receive any PRN medications  Per Patient:  On assessment today, the patient reports that he had a racing heart after he took paliperidone yesterday wishes to return to risperidone  on the condition that we keep it simple.  Patient wants to be able to take the same dose in the morning in the nighttime.  Agreed, patient appropriate to take 2 mg in the morning 2 mg at night.  Patient likes the sertraline  at the increased dose.  Told him he could go up on that later with his outpatient provider.  Patient feels that the Depakote  is at an appropriate level.  Patient had several questions about the legality of 72-hour form.  Informed him that he was welcome to do so, no current plans to involuntarily commit him   Sleep: Good Appetite:  Good Depression: Improved Anxiety: Worsened yesterday after paliperidone, better today Auditory Hallucinations: denies, not seen responding Visual Hallucinations: denies, not seen responding Paranoia: Denies, patient says he feels better about  the idea of eating regular food HI: Denies SI: Denies active or passive Side effects from medications: endorses the following side-effects they attribute to medications: palpitations. Other concerns discussed with patient:  Review of Systems  Constitutional:  Negative for chills, fever, malaise/fatigue and weight loss.  Psychiatric/Behavioral:  Positive for depression. Negative for hallucinations, substance abuse and suicidal ideas. The patient is nervous/anxious. The patient does not have insomnia.     Total time spent with patient: 30 minutes  HX from H&P: Past Psychiatric Hx: Previous Psych Diagnoses: schizoaffective bipolar type, OCD, chronic insomnia Prior inpatient treatment: [facility] 09/10/2024 Current/prior outpatient treatment: Prior rehab hx: Psychotherapy hx: History of suicide: History of homicide or aggression:  Psychiatric medication history:benztropine , risperidone , sertraline , buspirone   Psychiatric medication compliance history: Neuromodulation history:  Current Psychiatrist: Current therapist:    Substance Abuse Hx: Alcohol: Denies alcohol use Tobacco: Denies nicotine use 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  Patient had a very hard time in school.  Socially isolated, bullied.  Graduated from [employer] Abuse: Denies Marital Status: Single Sexual orientation: Prefers women Children: Patient's child  died in a car crash in May 16, 2025Employment: Unemployed, actively looking for work Peer Group: Patient is isolated from  others Housing: Stable housing with mother Finances: Finances are strained Legal: No current legal troubles Military: No military affiliation Firearms: No access     Past Medical History (auto-populated):  Past Medical History:  Diagnosis Date   Chronic insomnia 01/16/2024   Obsessive-compulsive behavior 01/16/2024   Schizoaffective disorder, bipolar type (HCC) 01/16/2024   Seasonal allergies    Vitamin D  deficiency 01/17/2024    Past Surgical History:  Procedure Laterality Date   APPENDECTOMY     ORIF RADIAL FRACTURE Left 03/17/2014   Procedure: OPEN REDUCTION INTERNAL FIXATION (ORIF) RADIAL FRACTURE;  Surgeon: Oneil JAYSON Herald, MD;  Location: [facility] OR;  Service: Orthopedics;  Laterality: Left;  Open Reduction Internal Fixation Left Radial Shaft Fracture    Social History:  Social History   Substance and Sexual Activity  Alcohol Use No     Social History   Substance and Sexual Activity  Drug Use No    Social History   Socioeconomic History   Marital status: Single    Spouse name: Not on file   Number of children: Not on file   Years of education: Not on file   Highest education level: Not on file  Occupational History   Not on file  Tobacco Use   Smoking status: Never   Smokeless tobacco: Never  Vaping Use   Vaping status: Never Used  Substance and Sexual Activity   Alcohol use: No   Drug use: No   Sexual activity: Not Currently  Other Topics Concern   Not on file  Social History Narrative   Not on file   Social Drivers of Health   Financial Resource Strain: Not on file  Food Insecurity: No Food Insecurity (10/05/2024)   Hunger Vital Sign    Worried About Running Out of Food in the Last Year: Never true    Ran Out of Food in the Last Year: Never true  Recent Concern: Food Insecurity - Food Insecurity Present (09/10/2024)   Hunger Vital  Sign    Worried About Running Out of Food in the Last Year: Sometimes true    Ran Out of Food in the Last Year: Sometimes true  Transportation Needs: No Transportation Needs (10/05/2024)   PRAPARE - Administrator, Civil Service (Medical): No    Lack of Transportation (Non-Medical): No  Physical Activity: Not on file  Stress: Not on file  Social Connections: Not on file   Additional Social History:  Objective: Medications, Labs, Mental Status Exam, Physical Exam Current Medications: Current Facility-Administered Medications  Medication Dose Route Frequency Provider Last Rate Last Admin   acetaminophen  (TYLENOL ) tablet 650 mg  650 mg Oral Q6H PRN Motley-Mangrum, [name], PMHNP       alum & mag hydroxide-simeth (MAALOX/MYLANTA) 200-200-20 MG/5ML suspension 30 mL  30 mL Oral Q4H PRN Motley-Mangrum, [name], PMHNP       haloperidol  (HALDOL ) tablet 5 mg  5 mg Oral TID PRN Motley-Mangrum, [name], PMHNP       And   diphenhydrAMINE  (BENADRYL ) capsule 50 mg  50 mg Oral TID PRN Motley-Mangrum, [name], PMHNP       haloperidol  lactate (HALDOL ) injection 5 mg  5 mg Intramuscular TID PRN Motley-Mangrum, [name], PMHNP       And   diphenhydrAMINE  (BENADRYL ) injection 50 mg  50 mg Intramuscular TID PRN Motley-Mangrum, [name], PMHNP       And   LORazepam  (ATIVAN ) injection 2 mg  2 mg Intramuscular TID PRN Motley-Mangrum, Jadeka  A, PMHNP       haloperidol  lactate (HALDOL ) injection 10 mg  10 mg Intramuscular TID PRN Motley-Mangrum, [name], PMHNP       And   diphenhydrAMINE  (BENADRYL ) injection 50 mg  50 mg Intramuscular TID PRN Motley-Mangrum, [name], PMHNP       And   LORazepam  (ATIVAN ) injection 2 mg  2 mg Intramuscular TID PRN Motley-Mangrum, [name], PMHNP       divalproex  (DEPAKOTE  ER) 24 hr tablet 1,000 mg  1,000 mg Oral Daily Delsie Lynwood Morene Lavone, MD       magnesium  hydroxide (MILK OF MAGNESIA) suspension 30 mL  30 mL Oral Daily PRN Motley-Mangrum, [name],  PMHNP       paliperidone (INVEGA) 24 hr tablet 6 mg  6 mg Oral Daily Delsie Lynwood Morene Lavone, MD   6 mg at 10/06/24 1549   sertraline  (ZOLOFT ) tablet 75 mg  75 mg Oral Daily Delsie Lynwood Morene Lavone, MD       traZODone  (DESYREL ) tablet 50 mg  50 mg Oral QHS PRN Motley-Mangrum, [name], PMHNP        Lab Results:  No results found for this or any previous visit (from the past 48 hours).  Blood Alcohol level:  Lab Results  Component Value Date   [facility] <15 10/04/2024   ETH <15 09/10/2024    Metabolic Disorder Labs: Lab Results  Component Value Date   HGBA1C 5.2 09/10/2024   MPG 102.54 09/10/2024   MPG 108.28 01/16/2024   No results found for: PROLACTIN Lab Results  Component Value Date   CHOL 290 (H) 09/10/2024   TRIG 39 09/10/2024   HDL 61 09/10/2024   CHOLHDL 4.8 09/10/2024   VLDL 8 09/10/2024   LDLCALC 221 (H) 09/10/2024   LDLCALC 72 01/16/2024    Physical Findings: AIMS:  , ,  ,  ,    CIWA:    COWS:     Musculoskeletal: Strength & Muscle Tone: within normal limits Gait & Station: normal Patient leans: N/A Appearance: Patient is a thin appearing mixed-race man.  He looks younger than stated age.  Pronounced strabismus  Behavior: Open, willing to talk.  Attitude: Cooperative to the best of his abilities.  Trying to remain calm  Speech: Extremely loud, clear and coherent, less pressured  Mood: I feel a little better  Affect: Labile  Thought Process: Circumstantial,  Thought Content: Some delusions, paranoia about people poisoning his food.  Obsessions involving numbers, cleanliness.  SI/HI: Passive SI.  Denies HI  Perceptions: Denies history of hallucinations.  Judgement: Shallow  Insight: Fair  Fund of Knowledge: WNL     Physical Exam: Physical Exam Vitals and nursing note reviewed.  Constitutional:      General: He is not in acute distress.    Appearance: Normal appearance. He is not ill-appearing.  HENT:     Head: Normocephalic and  atraumatic.  Pulmonary:     Effort: Pulmonary effort is normal.  Skin:    General: Skin is warm and dry.  Neurological:     General: No focal deficit present.     Mental Status: He is alert and oriented to person, place, and time.     Sensory: No sensory deficit.  Psychiatric:        Attention and Perception: Attention normal.        Mood and Affect: Mood normal.        Behavior: Behavior is hyperactive. Behavior is cooperative.  Thought Content: Thought content is paranoid.        Cognition and Memory: Cognition is impaired.        Judgment: Judgment normal.     Comments: Speech is extremely loud somewhat rapid.  Not quite pressured     Blood pressure 122/76, pulse 80, temperature 98.3 F (36.8 C), temperature source Oral, resp. rate 17, height 5' 10 (1.778 m), weight 76.7 kg, SpO2 100%. Body mass index is 24.25 kg/m.  ASSESSMENT: Daden Mahany is a 33 year old male past psychiatric history significant for schizoaffective disorder, obsessive-compulsive disorder.    Patient had recent hospitalization, but feels that things have not gotten better since he left the hospital.  Will attempt to call collateral tomorrow.   Patient was preoccupied with timing of medications, possible side effects of medications.  He feels that the risperidone  has been good but he only wants to take it once a day.  Patient has obsession with having all of his meds in the daytime and his vitamins at night.  This seems to come from patient's obsession with things being clean and orderly.   Provisional diagnoses includes schizoaffective disorder bipolar type, decompensated.  Also strong suspicion of obsessive-compulsive disorder.  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  11/14: Patient more relaxed, affable today.  Described that he really did not like the paliperidone, preferred to be back on risperidone .  Still wanted single dosing per day, but told him that the medication  works much better if it is dosed twice a day.  Patient reluctantly agreed to this.  Please with other medications on his regimen.  Reports general improvement relative to yesterday.  This is likely more placebo effect than anything else.   Diagnoses / Active Problems: Schizoaffective disorder Obsessive Compulsive Disorder Generalized Anxiety Disorder with Panic Features  PLAN: Safety and Monitoring:  --  Voluntary admission to inpatient psychiatric unit for safety, stabilization and treatment  -- Daily contact with patient to assess and evaluate symptoms and progress in treatment  -- Patient's case to be discussed in multi-disciplinary team meeting  -- Observation Level : q15 minute checks  -- Vital signs:  q12 hours  -- Precautions: suicide, elopement, and assault  2. Psychiatric Diagnoses and Treatment:  Restart risperidone  2 mg twice daily due to patient preference Discontinue paliperidone 6 mg daily for psychosis, mood stability Continue sertraline  75 mg every morning for OCD Continue Depakote  ER 1000 mg every morning for mood stability  The risks/benefits/side-effects/alternatives to this medication were discussed in detail with the patient and time was given for questions. The patient consents to medication trial.  Metabolic profile and EKG monitoring obtained while on an atypical antipsychotic Body mass index is 24.25 kg/m. Lipid Panel: Hbg A1c:  QTc: 406 ms, last obtained 09/03/2024 Encouraged patient to participate in unit milieu and in scheduled group therapies   Long Term Goal(s): Improvement in symptoms so as ready for discharge   Short Term Goals: Ability to maintain clinical measurements within normal limits will improve, Compliance with prescribed medications will improve, and Ability to identify triggers associated with substance abuse/mental health issues will improve    3. Medical Issues Being Addressed:  None Labs reviewed, unremarkable  Tobacco Use  Disorder  --  Patient in need of nicotine replacement; nicotine patch 14 mg / 24 hours ordered. Smoking cessation encouraged  -- Smoking cessation encouraged  4. Discharge Planning:   -- Social work and case management to assist with discharge planning and identification of hospital follow-up  needs prior to discharge  -- Estimated discharge: 2 to 3 days  -- Discharge Concerns: Need to establish a safety plan; Medication compliance and effectiveness  -- Discharge Goals: Return home with outpatient referrals for mental health follow-up including medication management/psychotherapy   Lynwood Morene Lavone Delsie, MD 10/07/2024, 7:57 AM

## 2024-10-07 NOTE — Progress Notes (Signed)
 Pt visible in the milieu.  Interacted appropriately with staff and peers.  Needs/concerns assessed.  Pt denied needs/concerns.  Pt rated anxiety 1/10 and depression 7/10.  Stated depression is improving.  Pt denied SI, HI and AVH.  Fifteen minute checks continue for patient safety.  Pt safe on unit.

## 2024-10-07 NOTE — Progress Notes (Addendum)
   10/07/24 2000  Psych Admission Type (Psych Patients Only)  Admission Status Voluntary  Psychosocial Assessment  Patient Complaints Worrying;Depression  Eye Contact Fair  Facial Expression Anxious;Worried  Affect Apprehensive;Anxious  Speech Logical/coherent  Interaction Assertive  Motor Activity Fidgety  Appearance/Hygiene Unremarkable  Behavior Characteristics Cooperative;Fidgety  Mood Pleasant;Anxious  Thought Process  Coherency Circumstantial  Content Preoccupation (with germs)  Delusions None reported or observed  Perception WDL  Hallucination None reported or observed  Judgment Impaired  Confusion None  Danger to Self  Current suicidal ideation? Denies  Agreement Not to Harm Self Yes  Description of Agreement verbal  Danger to Others  Danger to Others None reported or observed   Progress note   D: Pt seen at nurse's station. Pt denies SI, HI, AVH. Pt rates pain  0/10. Pt rates anxiety  0/10 and depression  6/10. Pt is worried and preoccupied about germs that may enter his body and cause a heart attack after maintenance came and repaired a valve in his shower. I touched the sink and then washed my hands and then ate some food. Could a chemical have gotten into my system and made me sick? Discussed the purpose of helpful bacteria in the mouth and on the skin as a barrier to infection. Also encouraged hand washing as a deterrent to spreading infection. Pt still ruminating but less so after conversation. States a positive thing about his day was laughing with his peers. It helped me to know that I am not alone here. Pt states he was told his discharge date will be Sunday. No other concerns noted at this time.  A: Pt provided support and encouragement. Pt given scheduled medication as prescribed. PRNs as appropriate. Q15 min checks for safety.   R: Pt safe on the unit. Will continue to monitor.

## 2024-10-07 NOTE — BH IP Treatment Plan (Signed)
 10;20 Interdisciplinary Treatment and Diagnostic Plan Update  10/07/2024 Time of Session: 10:20 AM Rodney Thomas MRN: [id number]  Principal Diagnosis: Schizoaffective disorder, bipolar type (HCC)  Secondary Diagnoses: Principal Problem:   Schizoaffective disorder, bipolar type (HCC) Active Problems:   Obsessive-compulsive behavior   Current Medications:  Current Facility-Administered Medications  Medication Dose Route Frequency Provider Last Rate Last Admin   acetaminophen  (TYLENOL ) tablet 650 mg  650 mg Oral Q6H PRN Motley-Mangrum, [name], PMHNP       alum & mag hydroxide-simeth (MAALOX/MYLANTA) 200-200-20 MG/5ML suspension 30 mL  30 mL Oral Q4H PRN Motley-Mangrum, [name], PMHNP       haloperidol  (HALDOL ) tablet 5 mg  5 mg Oral TID PRN Motley-Mangrum, [name], PMHNP       And   diphenhydrAMINE  (BENADRYL ) capsule 50 mg  50 mg Oral TID PRN Motley-Mangrum, [name], PMHNP       haloperidol  lactate (HALDOL ) injection 5 mg  5 mg Intramuscular TID PRN Motley-Mangrum, [name], PMHNP       And   diphenhydrAMINE  (BENADRYL ) injection 50 mg  50 mg Intramuscular TID PRN Motley-Mangrum, [name], PMHNP       And   LORazepam  (ATIVAN ) injection 2 mg  2 mg Intramuscular TID PRN Motley-Mangrum, [name], PMHNP       haloperidol  lactate (HALDOL ) injection 10 mg  10 mg Intramuscular TID PRN Motley-Mangrum, [name], PMHNP       And   diphenhydrAMINE  (BENADRYL ) injection 50 mg  50 mg Intramuscular TID PRN Motley-Mangrum, [name], PMHNP       And   LORazepam  (ATIVAN ) injection 2 mg  2 mg Intramuscular TID PRN Motley-Mangrum, [name], PMHNP       divalproex  (DEPAKOTE  ER) 24 hr tablet 1,000 mg  1,000 mg Oral Daily Delsie Lynwood Morene Lavone, MD   1,000 mg at 10/07/24 0802   magnesium  hydroxide (MILK OF MAGNESIA) suspension 30 mL  30 mL Oral Daily PRN Motley-Mangrum, [name], PMHNP       risperiDONE  (RISPERDAL  M-TABS) disintegrating tablet 2 mg  2 mg Oral BID Delsie Lynwood Morene Lavone, MD   2 mg at 10/07/24 1631   sertraline  (ZOLOFT ) tablet 75 mg  75 mg Oral Daily Delsie Lynwood Morene Lavone, MD   75 mg at 10/07/24 0802   traZODone  (DESYREL ) tablet 50 mg  50 mg Oral QHS PRN Motley-Mangrum, [name], PMHNP       PTA Medications: Medications Prior to Admission  Medication Sig Dispense Refill Last Dose/Taking   benztropine  (COGENTIN ) 0.5 MG tablet Take 1 tablet (0.5 mg total) by mouth 2 (two) times daily as needed for tremors. 15 tablet 0    busPIRone  (BUSPAR ) 10 MG tablet Take 1 tablet (10 mg total) by mouth 2 (two) times daily. 60 tablet 0    divalproex  (DEPAKOTE  ER) 250 MG 24 hr tablet Take 3 tablets (750 mg total) by mouth at bedtime. 90 tablet 0    Multiple Vitamin (MULTIVITAMIN WITH MINERALS) TABS tablet Take 1 tablet by mouth daily. 30 tablet 0    pantoprazole (PROTONIX) 20 MG tablet Take 1 tablet (20 mg total) by mouth daily. 30 tablet 0    polyethylene glycol powder (GLYCOLAX /MIRALAX ) 17 GM/SCOOP powder Take 17 g by mouth daily. Until daily soft stools  OTC 238 g 0    risperiDONE  (RISPERDAL ) 2 MG tablet Take 1 tablet (2 mg total) by mouth 2 (two) times daily. 60 tablet 0    sertraline  (ZOLOFT ) 50 MG tablet  Take 1 tablet (50 mg total) by mouth daily. 30 tablet 0     Patient Stressors: Health problems    Patient Strengths: Ability for insight  Supportive family/friends   Treatment Modalities: Medication Management, Group therapy, Case management,  1 to 1 session with clinician, Psychoeducation, Recreational therapy.   Physician Treatment Plan for Primary Diagnosis: Schizoaffective disorder, bipolar type (HCC) Long Term Goal(s): Improvement in symptoms so as ready for discharge   Short Term Goals: Ability to identify changes in lifestyle to reduce recurrence of condition will improve Ability to verbalize feelings will improve Ability to disclose and discuss suicidal ideas Ability to identify triggers associated with substance abuse/mental health  issues will improve Ability to maintain clinical measurements within normal limits will improve Compliance with prescribed medications will improve  Medication Management: Evaluate patient's response, side effects, and tolerance of medication regimen.  Therapeutic Interventions: 1 to 1 sessions, Unit Group sessions and Medication administration.  Evaluation of Outcomes: Not Progressing  Physician Treatment Plan for Secondary Diagnosis: Principal Problem:   Schizoaffective disorder, bipolar type (HCC) Active Problems:   Obsessive-compulsive behavior  Long Term Goal(s): Improvement in symptoms so as ready for discharge   Short Term Goals: Ability to identify changes in lifestyle to reduce recurrence of condition will improve Ability to verbalize feelings will improve Ability to disclose and discuss suicidal ideas Ability to identify triggers associated with substance abuse/mental health issues will improve Ability to maintain clinical measurements within normal limits will improve Compliance with prescribed medications will improve     Medication Management: Evaluate patient's response, side effects, and tolerance of medication regimen.  Therapeutic Interventions: 1 to 1 sessions, Unit Group sessions and Medication administration.  Evaluation of Outcomes: Not Progressing   RN Treatment Plan for Primary Diagnosis: Schizoaffective disorder, bipolar type (HCC) Long Term Goal(s): Knowledge of disease and therapeutic regimen to maintain health will improve  Short Term Goals: Ability to remain free from injury will improve, Ability to verbalize frustration and anger appropriately will improve, Ability to demonstrate self-control, Ability to participate in decision making will improve, Ability to verbalize feelings will improve, Ability to disclose and discuss suicidal ideas, Ability to identify and develop effective coping behaviors will improve, and Compliance with prescribed medications  will improve  Medication Management: RN will administer medications as ordered by provider, will assess and evaluate patient's response and provide education to patient for prescribed medication. RN will report any adverse and/or side effects to prescribing provider.  Therapeutic Interventions: 1 on 1 counseling sessions, Psychoeducation, Medication administration, Evaluate responses to treatment, Monitor vital signs and CBGs as ordered, Perform/monitor CIWA, COWS, AIMS and Fall Risk screenings as ordered, Perform wound care treatments as ordered.  Evaluation of Outcomes: Not Progressing   LCSW Treatment Plan for Primary Diagnosis: Schizoaffective disorder, bipolar type (HCC) Long Term Goal(s): Safe transition to appropriate next level of care at discharge, Engage patient in therapeutic group addressing interpersonal concerns.  Short Term Goals: Engage patient in aftercare planning with referrals and resources, Increase social support, Increase ability to appropriately verbalize feelings, Increase emotional regulation, Facilitate acceptance of mental health diagnosis and concerns, Facilitate patient progression through stages of change regarding substance use diagnoses and concerns, Identify triggers associated with mental health/substance abuse issues, and Increase skills for wellness and recovery  Therapeutic Interventions: Assess for all discharge needs, 1 to 1 time with Social worker, Explore available resources and support systems, Assess for adequacy in community support network, Educate family and significant other(s) on suicide prevention, Complete Psychosocial Assessment,  Interpersonal group therapy.  Evaluation of Outcomes: Not Progressing   Progress in Treatment: Attending groups: No. Participating in groups: No. Taking medication as prescribed: Yes. Toleration medication: Yes. Family/Significant other contact made: No, will contact:  Mother, Franke Menter, [phone] Patient  understands diagnosis: Yes. Discussing patient identified problems/goals with staff: Yes. Medical problems stabilized or resolved: Yes. Denies suicidal/homicidal ideation: Yes. Issues/concerns per patient self-inventory: No.  New problem(s) identified:  No  New Short Term/Long Term Goal(s):    medication stabilization, elimination of SI thoughts, development of comprehensive mental wellness plan.    Patient Goals:  I want to work on my depression.    Discharge Plan or Barriers:  Patient recently admitted. CSW will continue to follow and assess for appropriate referrals and possible discharge planning.    Reason for Continuation of Hospitalization: Anxiety Medication stabilization Suicidal ideation  Estimated Length of Stay:  5 - 7 days  Last 3 [place] Suicide Severity Risk Score: Flowsheet Row [facility] (Current) [facility] from 09/22/2024 in [facility] Emergency Department at [facility] Admission (Discharged) from 09/10/2024 in [facility]  C-SSRS RISK CATEGORY No Risk No Risk Low Risk    Last PHQ 2/9 Scores:    09/10/2024    4:04 AM 03/11/2024   11:34 AM 02/11/2024   11:55 AM  Depression screen PHQ 2/9  Decreased Interest 2 1 1   Down, Depressed, Hopeless 2 1 2   PHQ - 2 Score 4 2 3   Altered sleeping 2 2 1   Tired, decreased energy 0 0 1  Change in appetite 1 1 1   Feeling bad or failure about yourself  2 3 0  Trouble concentrating 2 0 1  Moving slowly or fidgety/restless 0 0 1  Suicidal thoughts 2 1 0  PHQ-9 Score 13  9  8    Difficult doing work/chores Very difficult Somewhat difficult Somewhat difficult     Data saved with a previous flowsheet row definition    Scribe for Treatment Team: Faigy Stretch O Martinez Boxx, LCSWA 10/07/2024 5:27 PM

## 2024-10-07 NOTE — Group Note (Signed)
 Date:  10/07/2024 Time:  10:15 AM  Group Topic/Focus:  Goals Group:   The focus of this group is to help patients establish daily goals to achieve during treatment and discuss how the patient can incorporate goal setting into their daily lives to aide in recovery.    Participation Level:  Did Not Attend  Participation Quality:  Did Not Attend  Affect:  Did Not Attend  Cognitive:  Did Not Attend  Insight: None  Engagement in Group:  Did Not Attend  Modes of Intervention:  Did Not Attend  Additional Comments:  Did Not Attend  Rodney Thomas 10/07/2024, 10:15 AM

## 2024-10-07 NOTE — Group Note (Signed)
 Date:  10/07/2024 Time:  4:49 PM  Group Topic/Focus:  Recovery Goals:   The focus of this group is to identify appropriate goals for recovery and establish a plan to achieve them.    Participation Level:  Did Not Attend    Rodney Thomas [facility] 10/07/2024, 4:49 PM

## 2024-10-07 NOTE — Progress Notes (Signed)
(  Sleep Hours) - 9 (Any PRNs that were needed, meds refused, or side effects to meds)- None (Any disturbances and when (visitation, over night)- None (Concerns raised by the patient)- None (SI/HI/AVH)- Denied

## 2024-10-07 NOTE — Group Note (Signed)
 Date:  10/07/2024 Time:  1:03 PM  Group Topic/Focus:  Wellness Toolbox:   The focus of this group is to discuss various aspects of wellness, balancing those aspects and exploring ways to increase the ability to experience wellness.  Patients will create a wellness toolbox for use upon discharge.    Participation Level:  Did Not Attend  Participation Quality:  Did Not Attend  Affect:  Did Not Attend  Cognitive:  Did Not Attend  Insight: None  Engagement in Group:  Did Not Attend  Modes of Intervention:  Did Not Attend  Additional Comments:  Did Not Attend  Rodney Thomas 10/07/2024, 1:03 PM

## 2024-10-07 NOTE — Plan of Care (Signed)
  Problem: Education: Goal: Emotional status will improve Outcome: Progressing Goal: Mental status will improve Outcome: Progressing Goal: Verbalization of understanding the information provided will improve Outcome: Progressing   Problem: Activity: Goal: Interest or engagement in activities will improve Outcome: Progressing Goal: Sleeping patterns will improve Outcome: Progressing   Problem: Activity: Goal: Interest or engagement in activities will improve Outcome: Progressing Goal: Sleeping patterns will improve Outcome: Progressing

## 2024-10-07 NOTE — [facility] Group Notes (Signed)
 Adult Psychoeducational Group Note  Date:  10/07/2024 Time:  8:46 PM  Group Topic/Focus:  Wrap-Up Group:   The focus of this group is to help patients review their daily goal of treatment and discuss progress on daily workbooks.  Participation Level:  Active  Participation Quality:  Appropriate  Affect:  Appropriate  Cognitive:  Appropriate  Insight: Appropriate  Engagement in Group:  Engaged  Modes of Intervention:  Discussion  Additional Comments:  Nylen said his day was a 110. Goal for today relax. Coping skills sleeping Favorite part of the day sleep  Lang Donia Law 10/07/2024, 8:46 PM

## 2024-10-08 MED ORDER — RISPERIDONE 3 MG PO TBDP
3.0000 mg | ORAL_TABLET | Freq: Every day | ORAL | Status: DC
  Administered 2024-10-08 – 2024-10-09 (×2): 3 mg via ORAL
  Filled 2024-10-08 (×2): qty 1
  Filled 2024-10-08 (×2): qty 10

## 2024-10-08 MED ORDER — RISPERIDONE 2 MG PO TBDP
2.0000 mg | ORAL_TABLET | Freq: Every day | ORAL | Status: DC
  Administered 2024-10-09 – 2024-10-10 (×2): 2 mg via ORAL
  Filled 2024-10-08: qty 1
  Filled 2024-10-08: qty 10
  Filled 2024-10-08: qty 1
  Filled 2024-10-08: qty 10

## 2024-10-08 NOTE — Group Note (Signed)
 [facility] LCSW Group Therapy Note    Group Date: 10/08/2024 Start Time: 1000 End Time: 1100  Type of Therapy and Topic:  Group Therapy:  Overcoming Obstacles  Participation Level:  [facility] PARTICIPATION LEVEL: Did Not Attend  Mood:  Description of Group:   In this group patients will be encouraged to explore what they see as obstacles to their own wellness and recovery. They will be guided to discuss their thoughts, feelings, and behaviors related to these obstacles. The group will process together ways to cope with barriers, with attention given to specific choices patients can make. Each patient will be challenged to identify changes they are motivated to make in order to overcome their obstacles. This group will be process-oriented, with patients participating in exploration of their own experiences as well as giving and receiving support and challenge from other group members.  Therapeutic Goals: 1. Patient will identify personal and current obstacles as they relate to admission. 2. Patient will identify barriers that currently interfere with their wellness or overcoming obstacles.  3. Patient will identify feelings, thought process and behaviors related to these barriers. 4. Patient will identify two changes they are willing to make to overcome these obstacles:    Summary of Patient Progress   NA   Therapeutic Modalities:   Cognitive Behavioral Therapy Solution Focused Therapy Motivational Interviewing Relapse Prevention Therapy   Jenise Iannelli O Keaundre Thelin, LCSWA

## 2024-10-08 NOTE — Group Note (Signed)
 Date:  10/08/2024 Time:  9:07 PM  Group Topic/Focus:  Wrap-Up Group:   The focus of this group is to help patients review their daily goal of treatment and discuss progress on daily workbooks.    Participation Level:  Active  Participation Quality:  Appropriate and Sharing  Affect:  Appropriate  Cognitive:  Appropriate  Insight: Appropriate  Engagement in Group:  Engaged  Modes of Intervention:  Activity and Socialization  Additional Comments:  Patient activitly enaged in wrap up group and participated in the group activity.  Eward Mace 10/08/2024, 9:07 PM

## 2024-10-08 NOTE — Progress Notes (Signed)
   10/08/24 1001  Psych Admission Type (Psych Patients Only)  Admission Status Voluntary  Psychosocial Assessment  Patient Complaints Depression  Eye Contact Brief  Facial Expression Anxious;Worried  Affect Anxious;Apprehensive  Speech Soft  Interaction Assertive  Motor Activity Fidgety  Appearance/Hygiene Unremarkable  Behavior Characteristics Cooperative  Mood Anxious;Sad  Thought Process  Coherency Unable to assess  Content Preoccupation  Delusions None reported or observed  Perception WDL  Hallucination None reported or observed  Judgment Limited  Confusion None  Danger to Self  Current suicidal ideation? Denies  Agreement Not to Harm Self Yes  Description of Agreement verbal  Danger to Others  Danger to Others None reported or observed

## 2024-10-08 NOTE — Group Note (Signed)
 Date:  10/08/2024 Time:  11:01 AM  Group Topic/Focus: Social work To help members understand different types of therapy, compare their benefits, and choose approaches that best meet their mental health needs.      Participation Level:  Did Not Attend   Rodney Thomas 10/08/2024, 11:01 AM

## 2024-10-08 NOTE — Progress Notes (Signed)
 Pt reports that he didn't eat breakfast and asked to wait until he ate to take Depakote .

## 2024-10-08 NOTE — Plan of Care (Signed)
  Problem: Education: Goal: Emotional status will improve Outcome: Progressing Goal: Mental status will improve Outcome: Progressing   Problem: Activity: Goal: Sleeping patterns will improve Outcome: Progressing   Problem: Coping: Goal: Ability to demonstrate self-control will improve Outcome: Progressing   Problem: Physical Regulation: Goal: Ability to maintain clinical measurements within normal limits will improve Outcome: Progressing   Problem: Safety: Goal: Periods of time without injury will increase Outcome: Progressing

## 2024-10-08 NOTE — Group Note (Signed)
 Date:  10/08/2024 Time:  10:31 AM  Group Topic/Focus: Social wellness and orientation goals Group Goals Group:   The focus of this group is to help patients establish daily goals to achieve during treatment and discuss how the patient can incorporate goal setting into their daily lives to aide in recovery. Orientation:   The focus of this group is to educate the patient on the purpose and policies of crisis stabilization and provide a format to answer questions about their admission.  The group details unit policies and expectations of patients while admitted.    Participation Level:  Did Not Attend   Dolores CHRISTELLA Fredericks 10/08/2024, 10:31 AM

## 2024-10-08 NOTE — Group Note (Signed)
 Date:  10/08/2024 Time:  4:22 PM  Group Topic/Focus:  Overcoming Stress:   The focus of this group is to define stress and help patients assess their triggers.    Participation Level:  Active  Participation Quality:  Appropriate  Affect:  Appropriate  Cognitive:  Appropriate  Insight: Appropriate  Engagement in Group:  Engaged  Modes of Intervention:  Discussion  Alyssa Mancera 10/08/2024, 4:22 PM

## 2024-10-08 NOTE — Progress Notes (Addendum)
 [facility] MD Progress Note 10/08/2024 9:06 AM Rodney Thomas  MRN:  [id number] Principal Problem: Schizoaffective disorder, bipolar type (HCC) Diagnosis: Principal Problem:   Schizoaffective disorder, bipolar type (HCC) Active Problems:   Obsessive-compulsive behavior   Subjective:   Rodney Thomas is a 33 year old male with a history of schizoaffective disorder, bipolar type, chronic insomnia and OCD who presented to the [facility] ED with mental issues, endorsing depressed mood, poor focus and feeling stressed.   Chart Review from last 24 hours and discussion during bed progression: The patient's chart was reviewed and nursing notes were reviewed. The patient's case was discussed in multidisciplinary team meeting.   - events per chart review / staff report: none - Patient received all scheduled medications - Patient received the following PRN medications: Haldol ,Benadryl   Per Patient:  On assessment today, the patient reports that he experienced chest pain yesterday evening.  As if someone was stabbing on his chest type of pain.  Patient reports that pain comes and goes, is currently off right now.  Also describes the pain as sharp in nature and nonradiating.  Patient also reports that he is experiencing paranoia, about germs and affecting his body after maintenance people help take sink yesterday.  Patient reports that he has continued to talk with mother and conversations are going well.  Patient amenable with getting labs drawn to work-up chest pain. Discussed with patient that low suspicion for heart attack, given normalcy of EKG, frequency and duration of symptoms. Patient continues to ask about possible discharge tomorrow vs Monday.    Sleep: Good Appetite:  Good Depression: Improved, 4 out of 10 Anxiety: 6 out of 10, fixated on pains and bodyaches Auditory Hallucinations: denies, not seen responding Visual Hallucinations: denies, not seen responding Paranoia: Persists  HI: Denies SI:  Denies active or passive Side effects from medications: endorses the following side-effects they attribute to medications: palpitations. Other concerns discussed with patient:  Review of Systems  Constitutional:  Negative for chills, fever, malaise/fatigue and weight loss.  Cardiovascular:  Positive for chest pain.       None currently, reported yesterday evening   Gastrointestinal:  Negative for nausea and vomiting.  Psychiatric/Behavioral:  Positive for depression. Negative for hallucinations, substance abuse and suicidal ideas. The patient is nervous/anxious. The patient does not have insomnia.     Total time spent with patient: 30 minutes  HX from H&P: Past Psychiatric Hx: Previous Psych Diagnoses: schizoaffective bipolar type, OCD, chronic insomnia Prior inpatient treatment: [facility] 09/10/2024 Current/prior outpatient treatment: Prior rehab hx: Psychotherapy hx: History of suicide: History of homicide or aggression:  Psychiatric medication history:benztropine , risperidone , sertraline , buspirone   Psychiatric medication compliance history: Neuromodulation history:  Current Psychiatrist: Current therapist:    Substance Abuse Hx: Alcohol: Denies alcohol use Tobacco: Denies nicotine use Illicit drugs: Denies all, UDS clean Prescription Drug abuse problems: Denies any   Past Medical History: Medical Diagnoses: No significant Home Rx: Denies Prior Hosp: Prior Surgeries/Trauma: Compound fracture of left forearm at age 65 Head trauma, LOC, concussions, seizures: Denies Allergies: Denies   Family History: Medical: Noncontributory Psych: Denies Psych Rx: SA/HA: Substance use family hx:   Social History: Childhood (bring, raised, lives now, parents, siblings, schooling, education): Born and raised in [place], currently lives with mother since approximately February.  Patient had a very hard time in school.  Socially isolated, bullied.  Graduated from [employer] Abuse: Denies Marital Status: Single Sexual orientation: Prefers women Children: Patient's child died in a car crash in 2024-04-07  Employment: Unemployed, actively looking for work Peer Group: Patient is isolated from others Housing: Stable housing with mother Finances: Finances are strained Legal: No current legal troubles Military: No military affiliation Firearms: No access     Past Medical History (auto-populated):  Past Medical History:  Diagnosis Date   Chronic insomnia 01/16/2024   Obsessive-compulsive behavior 01/16/2024   Schizoaffective disorder, bipolar type (HCC) 01/16/2024   Seasonal allergies    Vitamin D  deficiency 01/17/2024    Past Surgical History:  Procedure Laterality Date   APPENDECTOMY     ORIF RADIAL FRACTURE Left 03/17/2014   Procedure: OPEN REDUCTION INTERNAL FIXATION (ORIF) RADIAL FRACTURE;  Surgeon: Oneil JAYSON Herald, MD;  Location: [facility] OR;  Service: Orthopedics;  Laterality: Left;  Open Reduction Internal Fixation Left Radial Shaft Fracture    Social History:  Social History   Substance and Sexual Activity  Alcohol Use No     Social History   Substance and Sexual Activity  Drug Use No    Social History   Socioeconomic History   Marital status: Single    Spouse name: Not on file   Number of children: Not on file   Years of education: Not on file   Highest education level: Not on file  Occupational History   Not on file  Tobacco Use   Smoking status: Never   Smokeless tobacco: Never  Vaping Use   Vaping status: Never Used  Substance and Sexual Activity   Alcohol use: No   Drug use: No   Sexual activity: Not Currently  Other Topics Concern   Not on file  Social History Narrative   Not on file   Social Drivers of Health   Financial Resource Strain: Not on file  Food Insecurity: No Food Insecurity (10/05/2024)   Hunger Vital Sign    Worried About Running Out of Food in the Last Year: Never true    Ran Out of Food in the  Last Year: Never true  Recent Concern: Food Insecurity - Food Insecurity Present (09/10/2024)   Hunger Vital Sign    Worried About Running Out of Food in the Last Year: Sometimes true    Ran Out of Food in the Last Year: Sometimes true  Transportation Needs: No Transportation Needs (10/05/2024)   PRAPARE - Administrator, Civil Service (Medical): No    Lack of Transportation (Non-Medical): No  Physical Activity: Not on file  Stress: Not on file  Social Connections: Not on file   Additional Social History:  Objective: Medications, Labs, Mental Status Exam, Physical Exam Current Medications: Current Facility-Administered Medications  Medication Dose Route Frequency Provider Last Rate Last Admin   acetaminophen  (TYLENOL ) tablet 650 mg  650 mg Oral Q6H PRN Motley-Mangrum, [name], PMHNP       alum & mag hydroxide-simeth (MAALOX/MYLANTA) 200-200-20 MG/5ML suspension 30 mL  30 mL Oral Q4H PRN Motley-Mangrum, [name], PMHNP       haloperidol  (HALDOL ) tablet 5 mg  5 mg Oral TID PRN Motley-Mangrum, [name], PMHNP   5 mg at 10/07/24 2316   And   diphenhydrAMINE  (BENADRYL ) capsule 50 mg  50 mg Oral TID PRN Motley-Mangrum, [name], PMHNP   50 mg at 10/07/24 2316   haloperidol  lactate (HALDOL ) injection 5 mg  5 mg Intramuscular TID PRN Motley-Mangrum, [name], PMHNP       And   diphenhydrAMINE  (BENADRYL ) injection 50 mg  50 mg Intramuscular TID PRN Motley-Mangrum, [name], PMHNP  And   LORazepam  (ATIVAN ) injection 2 mg  2 mg Intramuscular TID PRN Motley-Mangrum, [name], PMHNP       haloperidol  lactate (HALDOL ) injection 10 mg  10 mg Intramuscular TID PRN Motley-Mangrum, [name], PMHNP       And   diphenhydrAMINE  (BENADRYL ) injection 50 mg  50 mg Intramuscular TID PRN Motley-Mangrum, [name], PMHNP       And   LORazepam  (ATIVAN ) injection 2 mg  2 mg Intramuscular TID PRN Motley-Mangrum, [name], PMHNP       divalproex  (DEPAKOTE  ER) 24 hr tablet 1,000 mg  1,000 mg Oral  Daily Delsie Lynwood Morene Lavone, MD   1,000 mg at 10/07/24 9197   hydrOXYzine  (ATARAX ) tablet 25 mg  25 mg Oral TID PRN Ajibola, Ene A, NP       magnesium  hydroxide (MILK OF MAGNESIA) suspension 30 mL  30 mL Oral Daily PRN Motley-Mangrum, [name], PMHNP       risperiDONE  (RISPERDAL  M-TABS) disintegrating tablet 2 mg  2 mg Oral BID Delsie Lynwood Morene Lavone, MD   2 mg at 10/08/24 0844   sertraline  (ZOLOFT ) tablet 75 mg  75 mg Oral Daily Delsie Lynwood Morene Lavone, MD   75 mg at 10/08/24 9155   traZODone  (DESYREL ) tablet 50 mg  50 mg Oral QHS PRN Motley-Mangrum, [name], PMHNP        Lab Results:  Results for orders placed or performed during the hospital encounter of 10/05/24 (from the past 48 hours)  Vitamin B12     Status: Abnormal   Collection Time: 10/07/24  6:31 PM  Result Value Ref Range   Vitamin B-12 915 (H) 180 - 914 pg/mL    Comment: Performed at [facility], [address] [place], [place] [address]  VITAMIN D  25 Hydroxy (Vit-D Deficiency, Fractures)     Status: Abnormal   Collection Time: 10/07/24  6:31 PM  Result Value Ref Range   Vit D, 25-Hydroxy 18.99 (L) 30 - 100 ng/mL    Comment: (NOTE) Vitamin D  deficiency has been defined by the Institute of Medicine  and an Endocrine Society practice guideline as a level of serum 25-OH  vitamin D  less than 20 ng/mL (1,2). The Endocrine Society went on to  further define vitamin D  insufficiency as a level between 21 and 29  ng/mL (2).  1. IOM (Institute of Medicine). 2010. Dietary reference intakes for  calcium and D. [place]: [employer]. 2. Holick MF, Binkley Traverse, Bischoff-Ferrari HA, et al. Evaluation,  treatment, and prevention of vitamin D  deficiency: an Endocrine  Society clinical practice guideline, JCEM. 2011 Jul; 96(7): 1911-30.  Performed at [facility], [address], [place], [place] [address]     Blood Alcohol level:  Lab Results  Component  Value Date   [employer] <15 10/04/2024   ETH <15 09/10/2024    Metabolic Disorder Labs: Lab Results  Component Value Date   HGBA1C 5.2 09/10/2024   MPG 102.54 09/10/2024   MPG 108.28 01/16/2024   No results found for: PROLACTIN Lab Results  Component Value Date   CHOL 290 (H) 09/10/2024   TRIG 39 09/10/2024   HDL 61 09/10/2024   CHOLHDL 4.8 09/10/2024   VLDL 8 09/10/2024   LDLCALC 221 (H) 09/10/2024   LDLCALC 72 01/16/2024    Physical Findings: AIMS:  , ,  ,  ,    CIWA:    COWS:     Musculoskeletal: Strength &  Muscle Tone: within normal limits Gait & Station: normal Patient leans: N/A Appearance: Patient is a thin appearing mixed-race man.  He looks younger than stated age.  Pronounced strabismus  Behavior: Open, willing to talk.  Attitude: Cooperative to the best of his abilities.  Trying to remain calm  Speech: Normal volume , clear and coherent, less pressured  Mood: worried about my chest   Affect: Euthymic   Thought Process: Circumstantial,  Thought Content: Some delusions, paranoia about germs infecting him after sink in room fixed. Obsessions involving numbers, cleanliness.  SI/HI: Denies both   Perceptions: Denies history of hallucinations.  Judgement: Shallow  Insight: Fair  Fund of Knowledge: WNL     Physical Exam: Physical Exam Vitals and nursing note reviewed.  Constitutional:      General: He is not in acute distress.    Appearance: Normal appearance. He is not ill-appearing.  HENT:     Head: Normocephalic and atraumatic.  Pulmonary:     Effort: Pulmonary effort is normal.  Skin:    General: Skin is warm and dry.  Neurological:     General: No focal deficit present.     Mental Status: He is alert and oriented to person, place, and time.     Sensory: No sensory deficit.  Psychiatric:        Attention and Perception: Attention normal.        Mood and Affect: Mood normal.        Behavior: Behavior is not hyperactive. Behavior is cooperative.         Thought Content: Thought content is paranoid.        Cognition and Memory: Cognition is not impaired.        Judgment: Judgment normal.     Comments: Speech is normal, rapid but not pressured      Blood pressure 117/84, pulse 60, temperature 98.3 F (36.8 C), temperature source Oral, resp. rate 17, height 5' 10 (1.778 m), weight 76.7 kg, SpO2 99%. Body mass index is 24.25 kg/m.  ASSESSMENT: Rodney Thomas is a 33 year old male past psychiatric history significant for schizoaffective disorder, obsessive-compulsive disorder.    Patient had recent hospitalization, but feels that things have not gotten better since he left the hospital.  Will attempt to call collateral tomorrow.   Patient was preoccupied with timing of medications, possible side effects of medications.  He feels that the risperidone  has been good but he only wants to take it once a day.  Patient has obsession with having all of his meds in the daytime and his vitamins at night.  This seems to come from patient's obsession with things being clean and orderly.   Provisional diagnoses includes schizoaffective disorder bipolar type, decompensated.  Also strong suspicion of obsessive-compulsive disorder.  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  11/14: Patient more relaxed, affable today.  Described that he really did not like the paliperidone, preferred to be back on risperidone .  Still wanted single dosing per day, but told him that the medication works much better if it is dosed twice a day.  Patient reluctantly agreed to this.  Please with other medications on his regimen.  Reports general improvement relative to yesterday.  This is likely more placebo effect than anything else.  11/15: Patient with paranoid thinking overnight and this morning.  Suspect chest pain is related to patient's baseline paranoia, his provider has treated this patient before in the past and he recurrently goes to the ED for  assessments.  During the interview, patient did not appear to be in any acute distress, no diaphoresis and no ongoing chest pain or radiation of symptoms.  EKG was also unremarkable and not suggestive of acute cardiac event.  Will obtain troponins to rule out and for patient reassurance.  Given requirement for agitation protocol in the evening, will increase risperidone  to 3 mg and continue with 2 mg during the day.  Patient continues to ask about discharge, would like to watch to see if any improvement with increased risperidone  dose.  Patient not currently meeting IVC criteria and will offer them to sign 72-hour voluntary    Diagnoses / Active Problems: Schizoaffective disorder Obsessive Compulsive Disorder Generalized Anxiety Disorder with Panic Features  PLAN: Safety and Monitoring:  --  Voluntary admission to inpatient psychiatric unit for safety, stabilization and treatment  -- Daily contact with patient to assess and evaluate symptoms and progress in treatment  -- Patient's case to be discussed in multi-disciplinary team meeting  -- Observation Level : q15 minute checks  -- Vital signs:  q12 hours  -- Precautions: suicide, elopement, and assault  2. Psychiatric Diagnoses and Treatment:  Increase evening risperidone  to 3 mg daily to address psychosis  Continued risperidone  2 mg daily to address psychosis  Discontinued paliperidone 6 mg daily for psychosis, mood stability, on 11/14 Continue sertraline  75 mg every morning for OCD Continue Depakote  ER 1000 mg every morning for mood stability  The risks/benefits/side-effects/alternatives to this medication were discussed in detail with the patient and time was given for questions. The patient consents to medication trial.  Metabolic profile and EKG monitoring obtained while on an atypical antipsychotic Body mass index is 24.25 kg/m. Lipid Panel: Hbg A1c:  QTc: 406 ms, last obtained 09/03/2024 Troponins  Encouraged patient to  participate in unit milieu and in scheduled group therapies   Long Term Goal(s): Improvement in symptoms so as ready for discharge   Short Term Goals: Ability to maintain clinical measurements within normal limits will improve, Compliance with prescribed medications will improve, and Ability to identify triggers associated with substance abuse/mental health issues will improve    3. Medical Issues Being Addressed:  None Labs reviewed, unremarkable  Tobacco Use Disorder  --  Patient in need of nicotine replacement; nicotine patch 14 mg / 24 hours ordered. Smoking cessation encouraged  -- Smoking cessation encouraged  4. Discharge Planning:   -- Social work and case management to assist with discharge planning and identification of hospital follow-up needs prior to discharge  -- Estimated discharge: 2 to 3 days  -- Discharge Concerns: Need to establish a safety plan; Medication compliance and effectiveness  -- Discharge Goals: Return home with outpatient referrals for mental health follow-up including medication management/psychotherapy   Rodney Xiang, MD 10/08/2024, 9:06 AM

## 2024-10-08 NOTE — Plan of Care (Signed)
  Problem: Education: Goal: Emotional status will improve Outcome: Progressing   Problem: Education: Goal: Mental status will improve Outcome: Progressing   Problem: Activity: Goal: Interest or engagement in activities will improve Outcome: Progressing   Problem: Physical Regulation: Goal: Ability to maintain clinical measurements within normal limits will improve Outcome: Progressing   Problem: Safety: Goal: Periods of time without injury will increase Outcome: Progressing

## 2024-10-08 NOTE — Progress Notes (Signed)
(  Sleep Hours) - 7 (Any PRNs that were needed, meds refused, or side effects to meds)-  haldol  5 mg, benadryl  50 mg - agitation and paranoia (Any disturbances and when (visitation, over night)- pt c/o chest pain believing he could possibly be poisoned; EKG done (Concerns raised by the patient)- worried that maintenance staff used chemicals when repairing his shower that could cause a heart attack (SI/HI/AVH)- denies

## 2024-10-09 DIAGNOSIS — F250 Schizoaffective disorder, bipolar type: Principal | ICD-10-CM

## 2024-10-09 MED ORDER — PROPRANOLOL HCL 10 MG PO TABS
10.0000 mg | ORAL_TABLET | Freq: Two times a day (BID) | ORAL | Status: DC
  Filled 2024-10-09: qty 1
  Filled 2024-10-09: qty 20

## 2024-10-09 NOTE — Progress Notes (Signed)
 [facility] Progress Note 10/09/2024 8:57 AM Mattia Liford  MRN:  [id number] Principal Problem: Schizoaffective disorder, bipolar type (HCC) Diagnosis: Principal Problem:   Schizoaffective disorder, bipolar type (HCC) Active Problems:   Obsessive-compulsive behavior   Subjective:   Rodney Thomas is a 33 year old male with a history of schizoaffective disorder, bipolar type, chronic insomnia and OCD who presented to the [facility] ED with mental issues, endorsing depressed mood, poor focus and feeling stressed.   Chart Review from last 24 hours and discussion during bed progression: The patient's chart was reviewed and nursing notes were reviewed. The patient's case was discussed in multidisciplinary team meeting.   - events per chart review / staff report: none - Patient received all scheduled medications - Patient received the following PRN medications: None   Per Patient:  On assessment today, the patient reports some improvements with paranoia today.  Denies any thoughts that men that fixed the sink contaminated anything that might cause him to get sicker causes chest pain.  Patient still reports some concerns about chest pain, and tropes were obtained this morning.  Continue to reassure patient of EKG results and symptoms less suspicious of acute cardiac event.  Patient denies depression and reports some mild anxiety just based on unknown discharge date.  Patient continues to ask about whether he will be discharged today versus Monday.  Cussed with patient the need to complete safety planning with his mom prior to discharge.  He voiced understanding and requested that we try to get in contact with her today.  Collateral Call, Mother,  Attempted to call x3 and was unsuccessful in conducting safety planning   Sleep: Good Appetite:  Good Depression: Improved Anxiety: Improved, fixated on pains and discharge date Auditory Hallucinations: denies, not seen responding Visual Hallucinations:  denies, not seen responding Paranoia: Persists  HI: Denies SI: Denies active or passive Side effects from medications: endorses the following side-effects they attribute to medications: palpitations. Other concerns discussed with patient:  Review of Systems  Constitutional:  Negative for chills, fever, malaise/fatigue and weight loss.  Cardiovascular:  Negative for chest pain.       None currently, reported yesterday evening   Gastrointestinal:  Negative for nausea and vomiting.  Psychiatric/Behavioral:  Negative for depression, hallucinations, substance abuse and suicidal ideas. The patient is nervous/anxious. The patient does not have insomnia.     Total time spent with patient: 30 minutes  HX from H&P: Past Psychiatric Hx: Previous Psych Diagnoses: schizoaffective bipolar type, OCD, chronic insomnia Prior inpatient treatment: [facility] 09/10/2024 Current/prior outpatient treatment: Prior rehab hx: Psychotherapy hx: History of suicide: History of homicide or aggression:  Psychiatric medication history:benztropine , risperidone , sertraline , buspirone   Psychiatric medication compliance history: Neuromodulation history:  Current Psychiatrist: Current therapist:    Substance Abuse Hx: Alcohol: Denies alcohol use Tobacco: Denies nicotine use Illicit drugs: Denies all, UDS clean Prescription Drug abuse problems: Denies any   Past Medical History: Medical Diagnoses: No significant Home Rx: Denies Prior Hosp: Prior Surgeries/Trauma: Compound fracture of left forearm at age 39 Head trauma, LOC, concussions, seizures: Denies Allergies: Denies   Family History: Medical: Noncontributory Psych: Denies Psych Rx: SA/HA: Substance use family hx:   Social History: Childhood (bring, raised, lives now, parents, siblings, schooling, education): Born and raised in [place], currently lives with mother since approximately February.  Patient had a very hard time in school.  Socially  isolated, bullied.  Graduated from [employer] Abuse: Denies Marital Status: Single Sexual orientation: Prefers women Children: Patient's child died  in a car crash in April 2025 Employment: Unemployed, actively looking for work Peer Group: Patient is isolated from others Housing: Stable housing with mother Finances: Finances are strained Legal: No current legal troubles Military: No military affiliation Firearms: No access     Past Medical History (auto-populated):  Past Medical History:  Diagnosis Date   Chronic insomnia 01/16/2024   Obsessive-compulsive behavior 01/16/2024   Schizoaffective disorder, bipolar type (HCC) 01/16/2024   Seasonal allergies    Vitamin D  deficiency 01/17/2024    Past Surgical History:  Procedure Laterality Date   APPENDECTOMY     ORIF RADIAL FRACTURE Left 03/17/2014   Procedure: OPEN REDUCTION INTERNAL FIXATION (ORIF) RADIAL FRACTURE;  Surgeon: Oneil JAYSON Herald, MD;  Location: [facility] OR;  Service: Orthopedics;  Laterality: Left;  Open Reduction Internal Fixation Left Radial Shaft Fracture    Social History:  Social History   Substance and Sexual Activity  Alcohol Use No     Social History   Substance and Sexual Activity  Drug Use No    Social History   Socioeconomic History   Marital status: Single    Spouse name: Not on file   Number of children: Not on file   Years of education: Not on file   Highest education level: Not on file  Occupational History   Not on file  Tobacco Use   Smoking status: Never   Smokeless tobacco: Never  Vaping Use   Vaping status: Never Used  Substance and Sexual Activity   Alcohol use: No   Drug use: No   Sexual activity: Not Currently  Other Topics Concern   Not on file  Social History Narrative   Not on file   Social Drivers of Health   Financial Resource Strain: Not on file  Food Insecurity: No Food Insecurity (10/05/2024)   Hunger Vital Sign    Worried About Running Out of Food in the Last  Year: Never true    Ran Out of Food in the Last Year: Never true  Recent Concern: Food Insecurity - Food Insecurity Present (09/10/2024)   Hunger Vital Sign    Worried About Running Out of Food in the Last Year: Sometimes true    Ran Out of Food in the Last Year: Sometimes true  Transportation Needs: No Transportation Needs (10/05/2024)   PRAPARE - Administrator, Civil Service (Medical): No    Lack of Transportation (Non-Medical): No  Physical Activity: Not on file  Stress: Not on file  Social Connections: Not on file   Additional Social History:  Objective: Medications, Labs, Mental Status Exam, Physical Exam Current Medications: Current Facility-Administered Medications  Medication Dose Route Frequency Provider Last Rate Last Admin   acetaminophen  (TYLENOL ) tablet 650 mg  650 mg Oral Q6H PRN Motley-Mangrum, [name], PMHNP       alum & mag hydroxide-simeth (MAALOX/MYLANTA) 200-200-20 MG/5ML suspension 30 mL  30 mL Oral Q4H PRN Motley-Mangrum, [name], PMHNP       haloperidol  (HALDOL ) tablet 5 mg  5 mg Oral TID PRN Motley-Mangrum, [name], PMHNP   5 mg at 10/07/24 2316   And   diphenhydrAMINE  (BENADRYL ) capsule 50 mg  50 mg Oral TID PRN Motley-Mangrum, [name], PMHNP   50 mg at 10/07/24 2316   haloperidol  lactate (HALDOL ) injection 5 mg  5 mg Intramuscular TID PRN Motley-Mangrum, [name], PMHNP       And   diphenhydrAMINE  (BENADRYL ) injection 50 mg  50 mg Intramuscular TID PRN Motley-Mangrum,  Cathaleen LABOR, PMHNP       And   LORazepam  (ATIVAN ) injection 2 mg  2 mg Intramuscular TID PRN Motley-Mangrum, [name], PMHNP       haloperidol  lactate (HALDOL ) injection 10 mg  10 mg Intramuscular TID PRN Motley-Mangrum, [name], PMHNP       And   diphenhydrAMINE  (BENADRYL ) injection 50 mg  50 mg Intramuscular TID PRN Motley-Mangrum, [name], PMHNP       And   LORazepam  (ATIVAN ) injection 2 mg  2 mg Intramuscular TID PRN Motley-Mangrum, [name], PMHNP       divalproex  (DEPAKOTE   ER) 24 hr tablet 1,000 mg  1,000 mg Oral Daily Delsie Lynwood Morene Lavone, MD   1,000 mg at 10/09/24 9240   hydrOXYzine  (ATARAX ) tablet 25 mg  25 mg Oral TID PRN Ajibola, Ene A, NP       magnesium  hydroxide (MILK OF MAGNESIA) suspension 30 mL  30 mL Oral Daily PRN Motley-Mangrum, [name], PMHNP       risperiDONE  (RISPERDAL  M-TABS) disintegrating tablet 2 mg  2 mg Oral Daily Bartolo Montanye, MD   2 mg at 10/09/24 0759   risperiDONE  (RISPERDAL  M-TABS) disintegrating tablet 3 mg  3 mg Oral Daily Lenard Calin, MD   3 mg at 10/08/24 1810   sertraline  (ZOLOFT ) tablet 75 mg  75 mg Oral Daily Delsie Lynwood Morene Lavone, MD   75 mg at 10/09/24 9241   traZODone  (DESYREL ) tablet 50 mg  50 mg Oral QHS PRN Motley-Mangrum, [name], PMHNP        Lab Results:  Results for orders placed or performed during the hospital encounter of 10/05/24 (from the past 48 hours)  Vitamin B12     Status: Abnormal   Collection Time: 10/07/24  6:31 PM  Result Value Ref Range   Vitamin B-12 915 (H) 180 - 914 pg/mL    Comment: Performed at [facility], [address] [place], [place] [address]  VITAMIN D  25 Hydroxy (Vit-D Deficiency, Fractures)     Status: Abnormal   Collection Time: 10/07/24  6:31 PM  Result Value Ref Range   Vit D, 25-Hydroxy 18.99 (L) 30 - 100 ng/mL    Comment: (NOTE) Vitamin D  deficiency has been defined by the Institute of Medicine  and an Endocrine Society practice guideline as a level of serum 25-OH  vitamin D  less than 20 ng/mL (1,2). The Endocrine Society went on to  further define vitamin D  insufficiency as a level between 21 and 29  ng/mL (2).  1. IOM (Institute of Medicine). 2010. Dietary reference intakes for  calcium and D. [place]: [employer]. 2. Holick MF, Binkley [place], Bischoff-Ferrari HA, et al. Evaluation,  treatment, and prevention of vitamin D  deficiency: an Endocrine  Society clinical practice guideline, JCEM. 2011 Jul;  96(7): 1911-30.  Performed at [facility], [address] [place], [place] [address]     Blood Alcohol level:  Lab Results  Component Value Date   [facility] <15 10/04/2024   ETH <15 09/10/2024    Metabolic Disorder Labs: Lab Results  Component Value Date   HGBA1C 5.2 09/10/2024   MPG 102.54 09/10/2024   MPG 108.28 01/16/2024   No results found for: PROLACTIN Lab Results  Component Value Date   CHOL 290 (H) 09/10/2024   TRIG 39 09/10/2024   HDL 61 09/10/2024   CHOLHDL 4.8 09/10/2024   VLDL 8 09/10/2024   LDLCALC 221 (H) 09/10/2024  LDLCALC 72 01/16/2024    Physical Findings: AIMS:  , ,  ,  ,    CIWA:    COWS:     Musculoskeletal: Strength & Muscle Tone: within normal limits Gait & Station: normal Patient leans: N/A Appearance: Patient is a thin appearing mixed-race man.  He looks younger than stated age.  Pronounced strabismus  Behavior: Open, willing to talk.  Attitude: Cooperative to the best of his abilities.  Trying to remain calm  Speech: Normal volume , clear and coherent, less pressured  Mood: worried about discharge   Affect: Euthymic   Thought Process: Circumstantial,  Thought Content: Some delusions, paranoia about discharge. Obsessions involving numbers, cleanliness.  SI/HI: Denies both   Perceptions: Denies history of hallucinations.  Judgement: Shallow  Insight: Fair  Fund of Knowledge: WNL     Physical Exam: Physical Exam Vitals and nursing note reviewed.  Constitutional:      General: He is not in acute distress.    Appearance: Normal appearance. He is not ill-appearing.  HENT:     Head: Normocephalic and atraumatic.  Pulmonary:     Effort: Pulmonary effort is normal.  Skin:    General: Skin is warm and dry.  Neurological:     General: No focal deficit present.     Mental Status: He is alert and oriented to person, place, and time.     Sensory: No sensory deficit.  Psychiatric:        Attention and Perception: Attention  normal.        Mood and Affect: Mood normal.        Behavior: Behavior is not hyperactive. Behavior is cooperative.        Thought Content: Thought content is paranoid.        Cognition and Memory: Cognition is not impaired.        Judgment: Judgment normal.     Comments: Speech is normal, rapid but not pressured      Blood pressure 117/78, pulse 84, temperature 98.3 F (36.8 C), temperature source Oral, resp. rate 17, height 5' 10 (1.778 m), weight 76.7 kg, SpO2 100%. Body mass index is 24.25 kg/m.  ASSESSMENT: Wilmot Quevedo is a 33 year old male past psychiatric history significant for schizoaffective disorder, obsessive-compulsive disorder.    Patient had recent hospitalization, but feels that things have not gotten better since he left the hospital.  Will attempt to call collateral tomorrow.   Patient was preoccupied with timing of medications, possible side effects of medications.  He feels that the risperidone  has been good but he only wants to take it once a day.  Patient has obsession with having all of his meds in the daytime and his vitamins at night.  This seems to come from patient's obsession with things being clean and orderly.   Provisional diagnoses includes schizoaffective disorder bipolar type, decompensated.  Also strong suspicion of obsessive-compulsive disorder.  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  11/14: Patient more relaxed, affable today.  Described that he really did not like the paliperidone, preferred to be back on risperidone .  Still wanted single dosing per day, but told him that the medication works much better if it is dosed twice a day.  Patient reluctantly agreed to this.  Please with other medications on his regimen.  Reports general improvement relative to yesterday.  This is likely more placebo effect than anything else.  11/15: Patient with paranoid thinking overnight and this morning.  Suspect chest pain is related  to  patient's baseline paranoia, his provider has treated this patient before in the past and he recurrently goes to the ED for assessments.  During the interview, patient did not appear to be in any acute distress, no diaphoresis and no ongoing chest pain or radiation of symptoms.  EKG was also unremarkable and not suggestive of acute cardiac event. Will obtain troponins to rule out and for patient reassurance.  Given requirement for agitation protocol in the evening, will increase risperidone  to 3 mg and continue with 2 mg during the day.  Patient continues to ask about discharge, would like to watch to see if any improvement with increased risperidone  dose.  Patient not currently meeting IVC criteria and will offer them to sign 72-hour voluntary  11/16: Patient is paranoid thinking appears to be approaching baseline.  Disclosed concern for increased agitation and paranoia prior evening led to medication adjustments. Patient aware of increase in risperidone  and will continue with it as currently ordered. Patient states increased heart rate with risperidone , suspect its anxiety induced and will start propanlol to help address symptoms. Unable to discuss safety planning with mom and will need to be conducted prior to discharge.  Patient consented to provider and primary team members to do safety planning.  Planning for discharge tomorrow morning to home.   Diagnoses / Active Problems: Schizoaffective disorder Obsessive Compulsive Disorder Generalized Anxiety Disorder with Panic Features  PLAN: Safety and Monitoring:  --  Voluntary admission to inpatient psychiatric unit for safety, stabilization and treatment  -- Daily contact with patient to assess and evaluate symptoms and progress in treatment  -- Patient's case to be discussed in multi-disciplinary team meeting  -- Observation Level : q15 minute checks  -- Vital signs:  q12 hours  -- Precautions: suicide, elopement, and assault  2. Psychiatric  Diagnoses and Treatment:  Continue risperidone  to 3 mg daily to address psychosis  Continue risperidone  2 mg daily to address psychosis  Discontinued paliperidone 6 mg daily for psychosis, mood stability, on 11/14 Start Propanolol 10 mg twice daily for anxiety Continue sertraline  75 mg every morning for OCD Continue Depakote  ER 1000 mg every morning for mood stability  The risks/benefits/side-effects/alternatives to this medication were discussed in detail with the patient and time was given for questions. The patient consents to medication trial.  Metabolic profile and EKG monitoring obtained while on an atypical antipsychotic Body mass index is 24.25 kg/m. Lipid Panel: Hbg A1c:  QTc: 406 ms, last obtained 09/03/2024 Troponins pending f/u result, low suspicion for acute cardiac event based on EKG, frequency, duration, and physical presentation of patient Encouraged patient to participate in unit milieu and in scheduled group therapies   Long Term Goal(s): Improvement in symptoms so as ready for discharge   Short Term Goals: Ability to maintain clinical measurements within normal limits will improve, Compliance with prescribed medications will improve, and Ability to identify triggers associated with substance abuse/mental health issues will improve    3. Medical Issues Being Addressed:  None Labs reviewed, unremarkable  Tobacco Use Disorder  --  Patient in need of nicotine replacement; nicotine patch 14 mg / 24 hours ordered. Smoking cessation encouraged  -- Smoking cessation encouraged  4. Discharge Planning:   -- Social work and case management to assist with discharge planning and identification of hospital follow-up needs prior to discharge  -- Estimated discharge: 10/10/2024   -- Discharge Concerns: Need to establish a safety plan; Medication compliance and effectiveness  -- Discharge Goals: Return home with  outpatient referrals for mental health follow-up including  medication management/psychotherapy   Mccauley Diehl, MD 10/09/2024, 8:57 AM

## 2024-10-09 NOTE — Plan of Care (Signed)
   Problem: Education: Goal: Mental status will improve Outcome: Progressing   Problem: Activity: Goal: Interest or engagement in activities will improve Outcome: Progressing

## 2024-10-09 NOTE — Plan of Care (Signed)
   Problem: Education: Goal: Knowledge of [facility] Education information/materials will improve Outcome: Progressing Goal: Emotional status will improve Outcome: Progressing Goal: Mental status will improve Outcome: Progressing Goal: Verbalization of understanding the information provided will improve Outcome: Progressing

## 2024-10-09 NOTE — Group Note (Signed)
 Date:  10/09/2024 Time:  6:47 PM   Group Topic/Focus:  Group used a non- competitive card game to build mindful listening skills, and encourage acceptance and understanding of oneself and peers. Patients cooperate and share, fostering empathy and personal growth in a supportive environment.      Participation Level:  Active  Participation Quality:  Appropriate and Attentive  Affect:  Appropriate  Cognitive:  Alert and Appropriate  Insight: Appropriate and Improving  Engagement in Group:  Engaged and Improving  Modes of Intervention:  Activity, Discussion, Exploration, Rapport Building, Socialization, and Support    Berwyn GORMAN Acosta 10/09/2024, 6:47 PM

## 2024-10-09 NOTE — Group Note (Signed)
 Date:  10/09/2024 Time:  9:20 AM  Group Topic/Focus:  Goals Group:   The focus of this group is to help patients establish daily goals to achieve during treatment and discuss how the patient can incorporate goal setting into their daily lives to aide in recovery.    Participation Level:  Did Not Attend  Ajmal Kathan A Vicki Chaffin 10/09/2024, 9:20 AM

## 2024-10-09 NOTE — BH Assessment (Signed)
(  Sleep Hours) - 7.25 (Any PRNs that were needed, meds refused, or side effects to meds)-  (Any disturbances and when (visitation, over night)- None (Concerns raised by the patient)- Have been paranoid R/T fluids maybe poisoned. (SI/HI/AVH)- Denies

## 2024-10-09 NOTE — Progress Notes (Signed)
   10/09/24 1000  Psych Admission Type (Psych Patients Only)  Admission Status Voluntary  Psychosocial Assessment  Patient Complaints Anxiety;Agitation  Eye Contact Intense  Facial Expression Wide-eyed;Masked  Affect Preoccupied  Speech Soft  Interaction Cautious;Guarded;Minimal  Motor Activity Pacing  Appearance/Hygiene Unremarkable  Behavior Characteristics Agitated;Anxious  Mood Preoccupied  Thought Process  Coherency Loose associations  Content Preoccupation;Paranoia  Delusions None reported or observed  Perception WDL  Hallucination None reported or observed  Judgment Limited  Confusion None  Danger to Self  Current suicidal ideation? Passive  Description of Suicide Plan No Plan  Agreement Not to Harm Self Yes  Description of Agreement Verbal Contract  Danger to Others  Danger to Others None reported or observed

## 2024-10-09 NOTE — Group Note (Signed)
 Date:  10/09/2024 Time:  9:07 PM  Group Topic/Focus:  Wrap-Up Group:   The focus of this group is to help patients review their daily goal of treatment and discuss progress on daily workbooks.    Participation Level:  Active  Participation Quality:  Appropriate and Sharing  Affect:  Appropriate  Cognitive:  Appropriate  Insight: Appropriate  Engagement in Group:  Engaged  Modes of Intervention:  Activity and Socialization  Additional Comments:  Patient actively engaged and shared during wrap up group and participated in group activity.   Eward Mace 10/09/2024, 9:07 PM

## 2024-10-09 NOTE — BHH Counselor (Signed)
 3:15pm CSW called the patient's mother to complete SPE. The patient's mother requested a call back in approximately 30 minutes.   3:59pm CSW called the patient's mother to complete SPE. CSW left generic voicemail advising to call back.   [place], [place]  4:02PM, 10/09/2024

## 2024-10-10 LAB — TROPONIN T: Troponin T (Highly Sensitive): <6 ng/L (ref 0–22)

## 2024-10-10 MED ORDER — SERTRALINE HCL 25 MG PO TABS
75.0000 mg | ORAL_TABLET | Freq: Every day | ORAL | 0 refills | Status: AC
Start: 2024-10-11 — End: ?

## 2024-10-10 MED ORDER — RISPERIDONE 2 MG PO TABS: ORAL_TABLET | ORAL | 0 refills | Status: AC

## 2024-10-10 MED ORDER — PROPRANOLOL HCL 10 MG PO TABS: 10.0000 mg | ORAL_TABLET | Freq: Two times a day (BID) | ORAL | 0 refills | Status: AC

## 2024-10-10 MED ORDER — DIVALPROEX SODIUM ER 500 MG PO TB24
1000.0000 mg | ORAL_TABLET | Freq: Every day | ORAL | 0 refills | Status: AC
Start: 2024-10-11 — End: ?

## 2024-10-10 MED ORDER — BENZTROPINE MESYLATE 0.5 MG PO TABS
0.5000 mg | ORAL_TABLET | Freq: Two times a day (BID) | ORAL | Status: DC | PRN
  Filled 2024-10-10 (×3): qty 10

## 2024-10-10 MED ORDER — HYDROXYZINE HCL 25 MG PO TABS
25.0000 mg | ORAL_TABLET | Freq: Three times a day (TID) | ORAL | 0 refills | Status: AC | PRN
Start: 2024-10-10 — End: ?

## 2024-10-10 MED ORDER — RISPERIDONE 2 MG PO TABS
2.0000 mg | ORAL_TABLET | Freq: Every day | ORAL | Status: DC
  Filled 2024-10-10: qty 10

## 2024-10-10 MED ORDER — SERTRALINE HCL 25 MG PO TABS
75.0000 mg | ORAL_TABLET | Freq: Every day | ORAL | Status: DC
  Filled 2024-10-10 (×2): qty 30

## 2024-10-10 MED ORDER — VITAMIN D (ERGOCALCIFEROL) 1.25 MG (50000 UNIT) PO CAPS: 50000.0000 [IU] | ORAL_CAPSULE | ORAL | Status: DC

## 2024-10-10 MED ORDER — RISPERIDONE 2 MG PO TBDP: ORAL_TABLET | ORAL | 0 refills | Status: DC

## 2024-10-10 MED ORDER — TRAZODONE HCL 50 MG PO TABS
50.0000 mg | ORAL_TABLET | Freq: Every evening | ORAL | 0 refills | Status: AC | PRN
Start: 2024-10-10 — End: ?

## 2024-10-10 MED ORDER — PANTOPRAZOLE SODIUM 20 MG PO TBEC
20.0000 mg | DELAYED_RELEASE_TABLET | Freq: Every day | ORAL | Status: DC
  Filled 2024-10-10 (×3): qty 10

## 2024-10-10 MED ORDER — RISPERIDONE 3 MG PO TABS
3.0000 mg | ORAL_TABLET | Freq: Every day | ORAL | Status: DC
  Filled 2024-10-10: qty 10

## 2024-10-10 NOTE — Progress Notes (Signed)
 Patient verbalizes readiness for discharge. All patient belongings returned to patient. Discharge instructions read and discussed with patient (appointments, medications, resources). Patient expressed gratitude for care provided. Patient discharged to lobby at 1505 where taxi driver was waiting.

## 2024-10-10 NOTE — Plan of Care (Signed)
   Problem: Coping: Goal: Ability to verbalize frustrations and anger appropriately will improve Outcome: Progressing   Problem: Safety: Goal: Periods of time without injury will increase Outcome: Progressing

## 2024-10-10 NOTE — Group Note (Signed)
 Date:  10/10/2024 Time:  11:25 AM  Group Topic/Focus: Emotional wellness Emotional Education:   The focus of this group is to discuss what feelings/emotions are, and how they are experienced.    Participation Level:  Did Not Attend   Rodney Thomas 10/10/2024, 11:25 AM

## 2024-10-10 NOTE — Progress Notes (Signed)
(  Sleep Hours) - 6 (Any PRNs that were needed, meds refused, or side effects to meds)- Tylenol  (Any disturbances and when (visitation, over night)- n/a (Concerns raised by the patient)- asking to be sent to the ER, feels his chest pain bursting open. (SI/HI/AVH)- denies

## 2024-10-10 NOTE — [facility] Suicide Risk Assessment (Signed)
 Suicide Risk Assessment  Discharge Assessment    [facility] Discharge Suicide Risk Assessment   Principal Problem: Schizoaffective disorder, bipolar type [facility]) Discharge Diagnoses: Principal Problem:   Schizoaffective disorder, bipolar type (HCC) Active Problems:   Obsessive-compulsive behavior   Total Time spent with patient: 30 minutes  Musculoskeletal: Strength & Muscle Tone: within normal limits Gait & Station: normal Patient leans: N/A Appearance: Patient is a thin appearing mixed-race man.  He looks younger than stated age.  Pronounced strabismus  Behavior: Open, willing to talk.  Attitude: Cooperative, anxious  Speech: Loud volume , clear and coherent, less pressured  Mood: anxiety about discharge is 9/10, but I'm fine as far as depression goes  Affect: Euthymic, expansive  Thought Process: Circumstantial,  Thought Content: Some delusions, paranoia about discharge. Obsessions involving numbers, cleanliness.  SI/HI: Denies both   Perceptions: Denies history of hallucinations.  Judgement: Shallow  Insight: Fair  Fund of Knowledge: WNL     Sleep  Sleep:No data recorded Estimated Sleeping Duration (Last 24 Hours): 5.00-5.75 hours (Due to Daylight Saving Time, the durations displayed may not accurately represent documentation during the time change interval)  Physical Exam: Physical Exam Vitals and nursing note reviewed.  Constitutional:      General: He is not in acute distress.    Appearance: Normal appearance. He is not ill-appearing.  HENT:     Head: Normocephalic and atraumatic.  Pulmonary:     Effort: Pulmonary effort is normal.  Skin:    General: Skin is warm and dry.  Neurological:     General: No focal deficit present.     Mental Status: He is alert and oriented to person, place, and time.     Sensory: No sensory deficit.  Psychiatric:        Attention and Perception: Attention and perception normal.        Mood and Affect: Mood is anxious. Affect is  labile.        Behavior: Behavior is hyperactive. Behavior is cooperative.        Thought Content: Thought content normal.        Cognition and Memory: Memory normal. Cognition is impaired.        Judgment: Judgment normal.    Review of Systems  Constitutional:  Negative for chills, diaphoresis, fever, malaise/fatigue and weight loss.  Respiratory:  Negative for cough.   Cardiovascular:  Positive for chest pain.       Patient has chest pain that occurs immediately after meals, regardless of size. Sounds like heartburn. EKG reviewed was normal.  Gastrointestinal:  Positive for heartburn. Negative for abdominal pain, constipation, diarrhea, nausea and vomiting.  Genitourinary:  Negative for urgency.  Neurological:  Negative for headaches.  Psychiatric/Behavioral:  Negative for depression, hallucinations, substance abuse and suicidal ideas. The patient is nervous/anxious and has insomnia.    Blood pressure 122/78, pulse 88, temperature 97.7 F (36.5 C), temperature source Oral, resp. rate 12, height 5' 10 (1.778 m), weight 76.7 kg, SpO2 98%. Body mass index is 24.25 kg/m.  Mental Status Per Nursing Assessment::   On Admission:  NA  Suicide Risk Assessment:  Suicidal ideation/thoughts:   []  Current         []  Recent         [x]  Denies        []  Remote   []  Chronic  Intention to act or plan:        []  Current     []  Recent [x]  Denies  []  Remote  Preparatory behavior:  []  Recent    [x]  Denies Recent      []  Remote Instance  Suicide attempts:  []  Immediately prior to this admission     []  During this admission    []  Recent    []  Multiple   [x]  Remote    []  Denies Ever     Risk Factors  Protective Factors  Acute  Recent loss (job, relationship, family member), Severe anxiety/panic, Sense of purposelessness, and Acute insomnia AcuteSuicideProtectiveFactors: No access to highly lethal means, Denies current SI or Intent, No recent suicide attempts, No recent self-harm behavior, No  command AH encouraging suicide, and No legal problems  Chronic Previous suicide attempt, Major psychiatric disorder, Poor coping or problem solving skills, Pattern of impulsivity/recklessness, Chronic unemployment, Chronic pain, and Male sex No previous self-harm behaviors, Stable housing, Positive social support, Lives with someone else, especially a relative, Good physical health, Age (25-59), No barriers to healthcare access, Willingness to seek help, Medication adherence, and Engagement in therapy   Potential future factors that may impact risk: FutureSuicideFactors : None  Summary: While it is impossible to accurately predict with absolute certainty future events and human behaviors, an assessment of current suicidal indicators, risk factors, and protective factors suggests that this patient's:   Acute suicide risk is: mild in degree.    Chronic suicide risk is: mild in degree.   Suicide risk increases with substance/alcohol use and acute intoxication.    Follow-up Information     [facility]. Go on 10/18/2024.   Specialty: Behavioral Health Why: Please go to this provider on 10/18/24 at 7:00 am for an assessment, to obtain medication management services. You may also go Monday through Friday, arrive by 7:00 am for an initial assessment. Contact information: [address] [place]  [address] [phone]        [place] Go on 10/11/2024.   Specialty: Professional Counselor Why: Please go to this provider on 10/11/24 at 9:00 am for an assessment, to obtain therapy services. You may also go Monday through Friday, from 9 am to 1 pm for an initial assessment. Contact information: [address] [place]  [address] [place] [address] [phone]                 Plan Of Care/Follow-up recommendations:  Activity: as tolerated  Diet: heart healthy  Other: -Follow-up with your outpatient psychiatric provider  -instructions on appointment date, time, and address (location) are provided to you in discharge paperwork.  -Take your psychiatric medications as prescribed at discharge - instructions are provided to you in the discharge paperwork  -Follow-up with outpatient primary care doctor and other specialists -for management of chronic medical disease, including: gastroesophageal reflux disease, vitamin D  deficiency.  -Testing: Follow-up with outpatient provider for abnormal lab results: none other than low vitamin D   -Recommend abstinence from alcohol, tobacco, and other illicit drug use at discharge.   -If your psychiatric symptoms recur, worsen, or if you have side effects to your psychiatric medications, call your outpatient psychiatric provider, 911, [phone] or go to the nearest emergency department.  -If suicidal thoughts recur, call your outpatient psychiatric provider, 911, [phone] or go to the nearest emergency department.   Lynwood Morene Lavone Delsie, MD 10/10/2024, 8:59 AM

## 2024-10-10 NOTE — Discharge Instructions (Signed)
 Learning about therapy: The term therapy can mean many things. There are many different types of psychotherapy, some are short-term, others take a longer period of time. A good therapist will meet with you, discuss your goals, and help you set a treatment plan that addresses your major concerns, regardless of what type of therapy they practice. If the first therapist is not a great fit, try another one.   The American Psychological Association (the APA) has resources to learn about different kinds of therapy.  Here is a link to learn more about the different kinds of psychotherapy:   gymville.si ____ Outpatient Therapy and Psychiatry Resources for Patients: Your psychiatric needs would be well-served by consultation and regular meetings with an outpatient therapist to assist you with your mood-related conditions. Here are a series of links for finding a therapist.    Includes links to the following: [facility] (http://wilson-mayo.com/) (only for [facility] and please reserve for uninsured) [facility] (http://blankenship-martinez.net/) Psychology Today Special Educational Needs Teacher (https://www.psychologytoday.com/us lendell) Psychology Today Support Group Tax Inspector (https://www.psychologytoday.com/us /groups/) [employer] - [employer] Location (https://carolinabehavioralcare.com/staff-location/[place]/) [facility] - Support Group Finder - (recorddebt.fi) [facility] [employer] - Lexicographer (https://fspcares.org/contact/) [employer] for Mental Health [employer] [place] - [facility]AMI (https://namiguilford.org/support-and-education/support-groups/) [employer] [facility] - Affiliated with  [facility] (https://www.[place].com/lb/locations/profile/[facility]-[address]/) Dept of Health and Human Services - Find a mental health facility (http://lester.info/)  ____ Closing notes from your doctor: It was a pleasure taking care of you while you were with us . I want to stress once more that medications are part of, but not ALL of what we must do to take care of ourselves mentally.   We all need: - Physical activity - at least 3, but preferably 5 days per week where we exercise for 30 minutes at a level where we get out of breath. That will vary by person and health conditions, but it will improve your mental health as well as your physical health. - Good restful sleep - avoid using a phone or screens in bed, sleep in a dark room, use a white noise machine or app and if you are having trouble sleeping, consult your doctor. - To reduce use of substances - alcohol and tobacco are shown to be harmful to many parts of our health, if you need help, ask for it. The same is true for illegal drugs, if you need help, ask for it. - To find purpose - whether it is caring for loved ones, volunteering, pursuing hobbies, or doing work that means something to you, try and live in accordance with your values. - Healthy relationships - seek healthy relationships where the other person helps you grow and challenges you to be your best. If it does not feel good, seek assistance.  JINNY Morene GORMAN Delsie, MD [facility] Psychiatry Resident

## 2024-10-10 NOTE — Progress Notes (Signed)
  [facility] Adult Case Management Discharge Plan :  Will you be returning to the same living situation after discharge:  Yes,  pt returning to mothers home  At discharge, do you have transportation home?: Yes,  CSW arranged [employer] taxi to address on file for 3PM (per MD request, mother is having procedure done this morning and will not be home until this afternoon and pt does not have a key or way to get in until she is home from the hospital.) Do you have the ability to pay for your medications: No. Samples requested from pharmacy  Release of information consent forms completed and in the chart;  Patient's signature needed at discharge.  Patient to Follow up at:  Follow-up Information     [place] [facility]. Go on 10/18/2024.   Specialty: Behavioral Health Why: Please go to this provider on 10/18/24 at 7:00 am for an assessment, to obtain medication management services. You may also go Monday through Friday, arrive by 7:00 am for an initial assessment. Contact information: [address] [place]  [phone]        [place]. Go on 10/11/2024.   Specialty: Professional Counselor Why: Please go to this provider on 10/11/24 at 9:00 am for an assessment, to obtain therapy services. You may also go Monday through Friday, from 9 am to 1 pm for an initial assessment. Contact information: [address] [place]  [address] [place] [address] [phone]                 Next level of care provider has access to [facility] Link:no  Safety Planning and Suicide Prevention discussed: Yes,  completed with patient. MD Delsie spoke with patient's mother and completed safety planning, pt able to return home.  CSW attempts: Mcarthur Ivins (mother) [phone] *1st att 11/14 1100, lvm. 2 attempts on 10/09/24 lvm. 1 att 11/17 @ 0840AM, lvm.      Has patient been referred to the Quitline?: Patient does not use tobacco/nicotine  products  Patient has been referred for addiction treatment: No known substance use disorder.  Jenkins LULLA Primer, LCSWA 10/10/2024, 8:54 AM

## 2024-10-10 NOTE — Discharge Summary (Signed)
 Physician Discharge Summary Note  Patient:  Rodney Thomas is an 33 y.o., male MRN:  [id number] DOB:  1991-06-10 Patient phone:  [phone] (home)  Patient address:   [address] [place] [address],  Total Time spent with patient: 30 minutes  Date of Admission:  10/05/2024 Date of Discharge: 10/10/2024  Reason for Admission:  Rodney Thomas is a 33 year old male with a history of schizoaffective disorder, bipolar type, chronic insomnia and OCD who presented to the [facility] ED with mental issues, endorsing depressed mood, poor focus and feeling stressed.   Principal Problem: Schizoaffective disorder, bipolar type [facility]) Discharge Diagnoses: Principal Problem:   Schizoaffective disorder, bipolar type (HCC) Active Problems:   Obsessive-compulsive behavior   Past Psychiatric History:  Previous Psych Diagnoses: schizoaffective bipolar type, OCD, chronic insomnia Prior inpatient treatment: [facility] 09/10/2024 Current/prior outpatient treatment: Prior rehab hx: Psychotherapy hx: History of suicide: History of homicide or aggression:  Psychiatric medication history:benztropine , risperidone , sertraline , buspirone   Psychiatric medication compliance history: Neuromodulation history:  Current Psychiatrist: Current therapist:    Substance Abuse Hx: Alcohol: Denies alcohol use Tobacco: Denies nicotine use 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  Patient had a very hard time in school.  Socially isolated, bullied.  Graduated from [employer] Abuse: Denies Marital Status: Single Sexual orientation: Prefers women Children: Patient's child died in a car crash in 05/13/25Employment: Unemployed, actively looking for work Peer Group: Patient is isolated from others Housing: Stable housing with mother Finances: Finances are strained Legal: No current legal troubles Military: No military affiliation Firearms: No access  Past Medical History:  Past Medical History:  Diagnosis Date   Chronic insomnia 01/16/2024   Obsessive-compulsive behavior 01/16/2024   Schizoaffective disorder, bipolar type (HCC) 01/16/2024   Seasonal allergies    Vitamin D  deficiency 01/17/2024    Past Surgical History:  Procedure Laterality Date   APPENDECTOMY     ORIF RADIAL FRACTURE Left 05-Apr-2014   Procedure: OPEN REDUCTION INTERNAL FIXATION (ORIF) RADIAL FRACTURE;  Surgeon: Oneil JAYSON Herald, MD;  Location: [facility] OR;  Service: Orthopedics;  Laterality: Left;  Open Reduction Internal Fixation Left Radial Shaft Fracture   Family History: History reviewed. No pertinent family history.  Social History:  Social History   Substance and Sexual Activity  Alcohol Use No     Social History   Substance and Sexual Activity  Drug Use No    Social History   Socioeconomic History   Marital status: Single    Spouse name: Not on file   Number of children: Not on file   Years of education: Not on file   Highest education level: Not on file  Occupational History   Not on file  Tobacco Use   Smoking status: Never   Smokeless tobacco: Never  Vaping Use   Vaping status: Never Used  Substance and Sexual Activity   Alcohol use: No   Drug use: No   Sexual activity: Not Currently  Other Topics Concern   Not on file  Social History Narrative   Not  on file   Social Drivers of Health   Financial Resource Strain: Not on file   Food Insecurity: No Food Insecurity (10/05/2024)   Hunger Vital Sign    Worried About Running Out of Food in the Last Year: Never true    Ran Out of Food in the Last Year: Never true  Recent Concern: Food Insecurity - Food Insecurity Present (09/10/2024)   Hunger Vital Sign    Worried About Running Out of Food in the Last Year: Sometimes true    Ran Out of Food in the Last Year: Sometimes true  Transportation Needs: No Transportation Needs (10/05/2024)   PRAPARE - Administrator, Civil Service (Medical): No    Lack of Transportation (Non-Medical): No  Physical Activity: Not on file  Stress: Not on file  Social Connections: Not on file    Hospital Course:   During the patient's hospitalization, patient had extensive initial psychiatric evaluation, and follow-up psychiatric evaluations every day.  Psychiatric diagnoses provided upon initial assessment:  Schizoaffective disorder (HCC)  Obsessive Compulsive Disorder  Patient's psychiatric medications were adjusted on admission:  Discontinue home risperidone  due to patient preference Start paliperidone 6 mg daily for psychosis, mood stability Increase sertraline  75 mg for OCD Change Depakote  ER from night to morning, increase dose from 750 mg to 1000 mg for mood stability  During the hospitalization, other adjustments were made to the patient's psychiatric medication regimen:  Discontinued paliperidone due to reported side effects Restarted risperidone , titrated it upwards   Patient's care was discussed during the interdisciplinary team meeting every day during the hospitalization.  The patient denied having side effects to prescribed psychiatric medication.  Gradually, patient started adjusting to milieu. The patient was evaluated each day by a clinical provider to ascertain response to treatment. Improvement was noted by the patient's report of decreasing symptoms, improved sleep and appetite, affect, medication  tolerance, behavior, and participation in unit programming.  Patient was asked each day to complete a self inventory noting mood, mental status, pain, new symptoms, anxiety and concerns.   Symptoms were reported as significantly decreased or resolved completely by discharge.  The patient reports that their mood is stable.  The patient denied having suicidal thoughts for more than 48 hours prior to discharge.  Patient denies having homicidal thoughts.  Patient denies having auditory hallucinations.  Patient denies any visual hallucinations or other symptoms of psychosis.  The patient was motivated to continue taking medication with a goal of continued improvement in mental health.   The patient reports their target psychiatric symptoms of anxiety, depression, obsessive thoughts responded well to the psychiatric medications, and the patient reports overall benefit other psychiatric hospitalization. Supportive psychotherapy was provided to the patient. The patient also participated in regular group therapy while hospitalized. Coping skills, problem solving as well as relaxation therapies were also part of the unit programming.  Labs were reviewed with the patient, and abnormal results were discussed with the patient.  The patient is able to verbalize their individual safety plan to this provider.  # It is recommended to the patient to continue psychiatric medications as prescribed, after discharge from the hospital.    # It is recommended to the patient to follow up with your outpatient psychiatric provider and PCP.  # It was discussed with the patient, the impact of alcohol, drugs, tobacco have been there overall psychiatric and medical wellbeing, and total abstinence from substance use was recommended the patient.ed.  # Prescriptions provided or sent directly  to preferred pharmacy at discharge. Patient agreeable to plan. Given opportunity to ask questions. Appears to feel comfortable with discharge.     # In the event of worsening symptoms, the patient is instructed to call the crisis hotline, 911 and or go to the nearest ED for appropriate evaluation and treatment of symptoms. To follow-up with primary care provider for other medical issues, concerns and or health care needs  # Patient was discharged home with a plan to follow up as noted below.    On day of discharge patient reported that his mood was good, he felt less anxiety, and he had slept fairly well over the night.  Spoke with patient's mother over the phone while patient was present in the room.  Mother, who had been difficult to reach throughout the hospitalization, reported that the patient was welcome to come home this afternoon.  She stated that she was having a medical procedure this morning and he would just need to get a cab home.  Patient felt that the hospitalization had been beneficial and he expressed gratitude to the staff.  Patient denied having side effects to medications, denied SI, HI, AVH.  He reported that the obsessive thoughts had greatly diminished.   Physical Findings: AIMS:  , ,  ,  ,  ,  ,   CIWA:    COWS:     Musculoskeletal: Strength & Muscle Tone: within normal limits Gait & Station: normal Patient leans: N/A Appearance: Patient is a thin appearing mixed-race man.  He looks younger than stated age.  Pronounced strabismus  Behavior: Open, willing to talk.  Attitude: Cooperative, anxious  Speech: Loud volume , clear and coherent, less pressured  Mood: anxiety about discharge is 9/10, but I'm fine as far as depression goes  Affect: Euthymic, expansive  Thought Process: Circumstantial,  Thought Content: Some delusions, paranoia about discharge. Obsessions involving numbers, cleanliness.  SI/HI: Denies both   Perceptions: Denies history of hallucinations.  Judgement: Shallow  Insight: Fair  Fund of Knowledge: WNL        Physical Exam: Vitals and nursing note reviewed.  Constitutional:       General: He is not in acute distress.    Appearance: Normal appearance. He is not ill-appearing.  HENT:     Head: Normocephalic and atraumatic.  Pulmonary:     Effort: Pulmonary effort is normal.  Skin:    General: Skin is warm and dry.  Neurological:     General: No focal deficit present.     Mental Status: He is alert and oriented to person, place, and time.     Sensory: No sensory deficit.  Psychiatric:        Attention and Perception: Attention and perception normal.        Mood and Affect: Mood is anxious. Affect is labile.        Behavior: Behavior is hyperactive. Behavior is cooperative.        Thought Content: Thought content normal.        Cognition and Memory: Memory normal. Cognition is impaired.        Judgment: Judgment normal.     Review of Systems  Constitutional:  Negative for chills, diaphoresis, fever, malaise/fatigue and weight loss.  Respiratory:  Negative for cough.   Cardiovascular:  Positive for chest pain.       Patient has chest pain that occurs immediately after meals, regardless of size. Sounds like heartburn. EKG reviewed was normal.  Gastrointestinal:  Positive for heartburn.  Negative for abdominal pain, constipation, diarrhea, nausea and vomiting.  Genitourinary:  Negative for urgency.  Neurological:  Negative for headaches.  Psychiatric/Behavioral:  Negative for depression, hallucinations, substance abuse and suicidal ideas. The patient is nervous/anxious and has insomnia.    Blood pressure 122/78, pulse 88, temperature 97.7 F (36.5 C), temperature source Oral, resp. rate 12, height 5' 10 (1.778 m), weight 76.7 kg, SpO2 98%. Body mass index is 24.25 kg/m.   Social History   Tobacco Use  Smoking Status Never  Smokeless Tobacco Never   Tobacco Cessation:  N/A, patient does not currently use tobacco products   Blood Alcohol level:  Lab Results  Component Value Date   [facility] <15 10/04/2024   ETH <15 09/10/2024    Metabolic Disorder Labs:   Lab Results  Component Value Date   HGBA1C 5.2 09/10/2024   MPG 102.54 09/10/2024   MPG 108.28 01/16/2024   No results found for: PROLACTIN Lab Results  Component Value Date   CHOL 290 (H) 09/10/2024   TRIG 39 09/10/2024   HDL 61 09/10/2024   CHOLHDL 4.8 09/10/2024   VLDL 8 09/10/2024   LDLCALC 221 (H) 09/10/2024   LDLCALC 72 01/16/2024    See Psychiatric Specialty Exam and Suicide Risk Assessment completed by Attending Physician prior to discharge.  Discharge destination:  Home  Is patient on multiple antipsychotic therapies at discharge:  No   Has Patient had three or more failed trials of antipsychotic monotherapy by history:  No  Recommended Plan for Multiple Antipsychotic Therapies: NA   Allergies as of 10/10/2024   No Known Allergies      Medication List     STOP taking these medications    busPIRone  10 MG tablet Commonly known as: BUSPAR        TAKE these medications      Indication  benztropine  0.5 MG tablet Commonly known as: COGENTIN  Take 1 tablet (0.5 mg total) by mouth 2 (two) times daily as needed for tremors.  Indication: Extrapyramidal Reaction caused by Medications   divalproex  500 MG 24 hr tablet Commonly known as: DEPAKOTE  ER Take 2 tablets (1,000 mg total) by mouth daily. Start taking on: October 11, 2024 What changed:  medication strength how much to take when to take this  Indication: Depressive Phase of Manic-Depression, MIXED BIPOLAR AFFECTIVE DISORDER   hydrOXYzine  25 MG tablet Commonly known as: ATARAX  Take 1 tablet (25 mg total) by mouth 3 (three) times daily as needed for anxiety.  Indication: Feeling Anxious   One-Daily Multi-Vitamin Tabs Take 1 tablet by mouth daily.  Indication: Weight Loss   pantoprazole 20 MG tablet Commonly known as: PROTONIX Take 1 tablet (20 mg total) by mouth daily.  Indication: Heartburn   polyethylene glycol powder 17 GM/SCOOP powder Commonly known as: GLYCOLAX /MIRALAX  Take 17 g  by mouth daily. Until daily soft stools  OTC  Indication: Constipation   propranolol 10 MG tablet Commonly known as: INDERAL Take 1 tablet (10 mg total) by mouth 2 (two) times daily.  Indication: Feeling Anxious, Anxiety Related to Current Life Problems   risperiDONE  2 MG tablet Commonly known as: RisperDAL  Take 1 tablet (2 mg total) by mouth daily AND 1.5 tablets (3 mg total) at bedtime. What changed: See the new instructions.  Indication: Schizophrenia   sertraline  25 MG tablet Commonly known as: ZOLOFT  Take 3 tablets (75 mg total) by mouth daily. Start taking on: October 11, 2024 What changed:  medication strength how much to take  Indication: Obsessive Compulsive  Disorder   traZODone  50 MG tablet Commonly known as: DESYREL  Take 1 tablet (50 mg total) by mouth at bedtime as needed for sleep.  Indication: Trouble Sleeping, Schizophrenia with Depression        Follow-up Information     [facility]. Go on 10/18/2024.   Specialty: Behavioral Health Why: Please go to this provider on 10/18/24 at 7:00 am for an assessment, to obtain medication management services. You may also go Monday through Friday, arrive by 7:00 am for an initial assessment. Contact information: [phone] [place]  [phone]        [facility]. Go on 10/11/2024.   Specialty: Professional Counselor Why: Please go to this provider on 10/11/24 at 9:00 am for an assessment, to obtain therapy services. You may also go Monday through Friday, from 9 am to 1 pm for an initial assessment. Contact information: [address] [place] [address] [phone]                 Plan Of Care/Follow-up recommendations:  Activity: as tolerated   Diet: heart healthy   Other: -Follow-up with your outpatient psychiatric provider -instructions on appointment date, time, and address (location) are provided to you  in discharge paperwork.   -Take your psychiatric medications as prescribed at discharge - instructions are provided to you in the discharge paperwork   -Follow-up with outpatient primary care doctor and other specialists -for management of chronic medical disease, including: gastroesophageal reflux disease, vitamin D  deficiency.   -Testing: Follow-up with outpatient provider for abnormal lab results: none other than low vitamin D    -Recommend abstinence from alcohol, tobacco, and other illicit drug use at discharge.    -If your psychiatric symptoms recur, worsen, or if you have side effects to your psychiatric medications, call your outpatient psychiatric provider, 911, [phone] or go to the nearest emergency department.   -If suicidal thoughts recur, call your outpatient psychiatric provider, 911, [phone] or go to the nearest emergency department.    Signed: Lynwood Morene Lavone Delsie, MD 10/10/2024, 3:53 PM

## 2024-10-10 NOTE — Group Note (Signed)
 Date:  10/10/2024 Time:  11:00 AM  Group Topic/Focus: Group Topic/Focus: Recreational Therapy  Patients played blind picturing  where the speaker describes the geometrical image in detail then the drawer has to recreate the image of the picture.      Participation Level:  Did Not Attend  Rodney Thomas 10/10/2024, 11:00 AM

## 2024-10-10 NOTE — Transportation (Signed)
 10/10/2024  Fonda Kung DOB: 09/29/1991 MRN: [id number]   RIDER WAIVER AND RELEASE OF LIABILITY  For the purposes of helping with transportation needs, [facility] partners with outside transportation providers (taxi companies, [place], catering manager.) to give [place] patients or other approved people the choice of on-demand rides Public Librarian) to our buildings for non-emergency visits.  By using [employer], I, the person signing this document, on behalf of myself and/or any legal minors (in my care using the [employer]), agree:  Science Writer given to me are supplied by independent, outside transportation providers who do not work for, or have any affiliation with, [employer]. [facility] is not a transportation company. [facility] has no control over the quality or safety of the rides I get using [employer]. [facility] has no control over whether any outside ride will happen on time or not. [facility] gives no guarantee on the reliability, quality, safety, or availability on any rides, or that no mistakes will happen. I know and accept that traveling by vehicle (car, truck, SVU, fleeta, bus, taxi, etc.) has risks of serious injuries such as disability, being paralyzed, and death. I know and agree the risk of using [employer] is mine alone, and not [employer]. [employer] are provided as is and as are available. The transportation providers are in charge for all inspections and care of the vehicles used to provide these rides. I agree not to take legal action against [facility], its agents, employees, officers, directors, representatives, insurers, attorneys, assigns, successors, subsidiaries, and affiliates at any time for any reasons related directly or indirectly to using [employer]. I also agree not to take legal action against [facility] or its affiliates for any injury, death, or damage to property caused by or related to using  [employer]. I have read this Waiver and Release of Liability, and I understand the terms used in it and their legal meaning. This Waiver is freely and voluntarily given with the understanding that my right (or any legal minors) to legal action against [place] relating to [employer] is knowingly given up to use these services.   I attest that I read the Ride Waiver and Release of Liability to Fonda Kung, gave Mr. Sawin the opportunity to ask questions and answered the questions asked (if any). I affirm that Amr Tomaselli then provided consent for assistance with transportation.

## 2024-10-10 NOTE — Group Note (Signed)
 Date:  10/10/2024 Time:  1:37 PM  Group Topic/Focus: Physical wellness Physical wellness and mental health are deeply connected. When you take care of your body, you support your brain's ability to manage stress, regulate emotions, and function more effectively. Here's how physical wellness boosts mental well-being--and how to start improving both:      Participation Level:  Did Not Attend   Rodney Thomas 10/10/2024, 1:37 PM

## 2024-10-10 NOTE — Group Note (Signed)
 Recreation Therapy Group Note   Group Topic:Communication  Group Date: 10/10/2024 Start Time: 0945 End Time: 1020 Facilitators: Norene Oliveri-McCall, LRT,CTRS Location: [address]   Group Topic: Communication, Problem Solving   Goal Area(s) Addresses:  Patient will effectively listen to complete activity.  Patient will identify communication skills used to make activity successful.  Patient will identify how skills used during activity can be used to reach post d/c goals.    Behavioral Response:    Intervention: Building Surveyor Activity - Geometric pattern cards, pencils, blank paper    Activity: Geometric Drawings.  Three volunteers from the peer group will be shown an abstract picture with a particular arrangement of geometrical shapes.  Each round, one 'speaker' will describe the pattern, as accurately as possible without revealing the image to the group.  The remaining group members will listen and draw the picture to reflect how it is described to them. Patients with the role of 'listener' cannot ask clarifying questions but, may request that the speaker repeat a direction. Once the drawings are complete, the presenter will show the rest of the group the picture and compare how close each person came to drawing the picture. LRT will facilitate a post-activity discussion regarding effective communication and the importance of planning, listening, and asking for clarification in daily interactions with others.  Education: Environmental consultant, Active listening, Support systems, Discharge planning  Education Outcome: Acknowledges understanding/In group clarification offered/Needs additional education.    Affect/Mood: N/A   Participation Level: Did not attend    Clinical Observations/Individualized Feedback:      Plan: Continue to engage patient in RT group sessions 2-3x/week.   Meko Masterson-McCall, LRT,CTRS 10/10/2024 1:19 PM

## 2024-10-10 NOTE — Group Note (Signed)
 Date:  10/10/2024 Time:  2:04 PM  Group Topic/Focus: [place] Spirituality:   The focus of this group is to discuss how one's spirituality can aide in recovery.    Participation Level:  Did Not Attend   Dolores CHRISTELLA Fredericks 10/10/2024, 2:04 PM

## 2024-10-10 NOTE — Group Note (Signed)
 Date:  10/10/2024 Time:  10:16 AM  Group Topic/Focus: Goals group orientation Goals Group:   The focus of this group is to help patients establish daily goals to achieve during treatment and discuss how the patient can incorporate goal setting into their daily lives to aide in recovery.    Participation Level:  Did Not Attend   Rodney Thomas 10/10/2024, 10:16 AM

## 2024-10-10 NOTE — [facility] Suicide Risk Assessment (Signed)
 [facility] INPATIENT:  Family/Significant Other Suicide Prevention Education  Suicide Prevention Education:   Suicide Prevention Education was reviewed thoroughly with patient, including risk factors, warning signs, and what to do. Mobile Crisis services were described and that telephone number pointed out, with encouragement to patient to put this number in personal cell phone. Brochure was provided to patient to share with natural supports. Patient acknowledged the ways in which they are at risk, and how working through each of their issues can gradually start to reduce their risk factors. Patient was encouraged to think of the information in the context of people in their own lives. Patient denied having access to firearms Patient verbalized understanding of information provided. Patient endorsed a desire to live.
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
## 2024-12-29 ENCOUNTER — Encounter (HOSPITAL_COMMUNITY): Payer: 59 | Admitting: Psychiatry
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
## 2025-01-12 ENCOUNTER — Encounter (HOSPITAL_COMMUNITY): Admitting: Psychiatry

## 2025-02-01 ENCOUNTER — Ambulatory Visit (HOSPITAL_COMMUNITY): Payer: 59
